# Patient Record
Sex: Female | Born: 1945
Health system: Southern US, Community
[De-identification: ages and names within clinical notes are randomized; demographics above are authoritative.]

## PROBLEM LIST (undated history)

## (undated) DIAGNOSIS — E782 Mixed hyperlipidemia: Secondary | ICD-10-CM

## (undated) DIAGNOSIS — I2510 Atherosclerotic heart disease of native coronary artery without angina pectoris: Principal | ICD-10-CM

## (undated) DIAGNOSIS — I10 Essential (primary) hypertension: Secondary | ICD-10-CM

## (undated) DIAGNOSIS — M1990 Unspecified osteoarthritis, unspecified site: Secondary | ICD-10-CM

## (undated) DIAGNOSIS — S82891A Other fracture of right lower leg, initial encounter for closed fracture: Secondary | ICD-10-CM

## (undated) HISTORY — DX: Mixed hyperlipidemia: E78.2

## (undated) HISTORY — DX: Essential (primary) hypertension: I10

## (undated) HISTORY — DX: Unspecified osteoarthritis, unspecified site: M19.90

## (undated) HISTORY — DX: Other fracture of right lower leg, initial encounter for closed fracture: S82.891A

## (undated) HISTORY — DX: Atherosclerotic heart disease of native coronary artery without angina pectoris: I25.10

---

## 1976-08-26 HISTORY — PX: CERVICAL CONE BIOPSY: SUR198

## 1978-08-26 HISTORY — PX: TUBAL LIGATION: SHX77

## 1998-06-26 ENCOUNTER — Ambulatory Visit (HOSPITAL_COMMUNITY): Admission: RE | Admit: 1998-06-26 | Discharge: 1998-06-26 | Payer: Self-pay | Admitting: Obstetrics and Gynecology

## 1998-06-26 ENCOUNTER — Encounter: Payer: Self-pay | Admitting: Obstetrics and Gynecology

## 1999-01-25 ENCOUNTER — Other Ambulatory Visit: Admission: RE | Admit: 1999-01-25 | Discharge: 1999-01-25 | Payer: Self-pay | Admitting: *Deleted

## 2000-02-26 ENCOUNTER — Other Ambulatory Visit: Admission: RE | Admit: 2000-02-26 | Discharge: 2000-02-26 | Payer: Self-pay | Admitting: Obstetrics and Gynecology

## 2001-02-27 ENCOUNTER — Other Ambulatory Visit: Admission: RE | Admit: 2001-02-27 | Discharge: 2001-02-27 | Payer: Self-pay | Admitting: Obstetrics and Gynecology

## 2002-03-01 ENCOUNTER — Other Ambulatory Visit: Admission: RE | Admit: 2002-03-01 | Discharge: 2002-03-01 | Payer: Self-pay | Admitting: Obstetrics and Gynecology

## 2003-01-31 ENCOUNTER — Ambulatory Visit (HOSPITAL_COMMUNITY): Admission: RE | Admit: 2003-01-31 | Discharge: 2003-01-31 | Payer: Self-pay | Admitting: *Deleted

## 2003-03-04 ENCOUNTER — Other Ambulatory Visit: Admission: RE | Admit: 2003-03-04 | Discharge: 2003-03-04 | Payer: Self-pay | Admitting: Obstetrics and Gynecology

## 2003-08-27 DIAGNOSIS — I2510 Atherosclerotic heart disease of native coronary artery without angina pectoris: Secondary | ICD-10-CM

## 2003-08-27 HISTORY — DX: Atherosclerotic heart disease of native coronary artery without angina pectoris: I25.10

## 2003-11-25 HISTORY — PX: CORONARY STENT PLACEMENT: SHX1402

## 2003-12-20 ENCOUNTER — Ambulatory Visit (HOSPITAL_COMMUNITY): Admission: RE | Admit: 2003-12-20 | Discharge: 2003-12-21 | Payer: Self-pay | Admitting: Cardiology

## 2004-03-06 ENCOUNTER — Other Ambulatory Visit: Admission: RE | Admit: 2004-03-06 | Discharge: 2004-03-06 | Payer: Self-pay | Admitting: Obstetrics and Gynecology

## 2005-04-05 ENCOUNTER — Other Ambulatory Visit: Admission: RE | Admit: 2005-04-05 | Discharge: 2005-04-05 | Payer: Self-pay | Admitting: Obstetrics and Gynecology

## 2006-04-15 ENCOUNTER — Other Ambulatory Visit: Admission: RE | Admit: 2006-04-15 | Discharge: 2006-04-15 | Payer: Self-pay | Admitting: Obstetrics & Gynecology

## 2007-04-21 ENCOUNTER — Other Ambulatory Visit: Admission: RE | Admit: 2007-04-21 | Discharge: 2007-04-21 | Payer: Self-pay | Admitting: Obstetrics & Gynecology

## 2008-05-09 ENCOUNTER — Other Ambulatory Visit: Admission: RE | Admit: 2008-05-09 | Discharge: 2008-05-09 | Payer: Self-pay | Admitting: Obstetrics and Gynecology

## 2010-04-24 ENCOUNTER — Ambulatory Visit: Payer: Self-pay | Admitting: Cardiovascular Disease

## 2010-04-24 DIAGNOSIS — I2510 Atherosclerotic heart disease of native coronary artery without angina pectoris: Secondary | ICD-10-CM | POA: Insufficient documentation

## 2010-04-24 DIAGNOSIS — I10 Essential (primary) hypertension: Secondary | ICD-10-CM | POA: Insufficient documentation

## 2010-04-24 DIAGNOSIS — E785 Hyperlipidemia, unspecified: Secondary | ICD-10-CM | POA: Insufficient documentation

## 2010-09-25 NOTE — Assessment & Plan Note (Signed)
Summary: np6   Visit Type:  Initial Consult Primary Provider:  Dr Ricki Miller  CC:  CAD.  History of Present Illness: 65 year-old woman presents for initial evaluation of CAD. She was initially diagnosed with CAD after an abnormal stress EKG led to cardiac cath in 2005. She was found to have LAD stenosis and was treated with a DES. She had a relook cath after another abnormal stress test and this demonstrated a widely patent stent and no other obstructive CAD (per pt - I do not have report of relook cath).  She denies any history of exertional chest pain or tightness. No dyspnea with exertion. No lightheadedness, palpitations, syncope, or other complaints at this time. She has lost 10 pounds in the past year through exercise. She does regular water aerobics, yoga, and dancing.  Current Medications (verified): 1)  Diovan 80 Mg Tabs (Valsartan) .... Take One Tablet By Mouth Daily 2)  Crestor 10 Mg Tabs (Rosuvastatin Calcium) .... Take One Tablet By Mouth Daily. 3)  Aspirin 81 Mg Tbec (Aspirin) .... Take One Tablet By Mouth Daily 4)  Premarin 0.3 Mg Tabs (Estrogens Conjugated) .... Take 1 Tablet By Mouth Once A Day 5)  Prometrium 100 Mg Caps (Progesterone Micronized) .... Take One Capsule By Mouth Every Other Day 6)  Multivitamins  Tabs (Multiple Vitamin) .... Take 1 Tablet By Mouth Once A Day 7)  Fish Oil 1000 Mg Caps (Omega-3 Fatty Acids) .... Take 1 Capsule By Mouth Once A Day 8)  Calcium 600+d Plus Minerals 600-400 Mg-Unit Tabs (Calcium Carbonate-Vit D-Min) .... Take 1 Tablet By Mouth Once A Day 9)  Vitamin D3 1000 Unit Tabs (Cholecalciferol) .... Take 1 Tablet By Mouth Once A Day  Allergies (verified): No Known Drug Allergies  Past History:  Past medical, surgical, family and social histories (including risk factors) reviewed, and no changes noted (except as noted below).  Past Medical History:  Single-vessel coronary artery disease with 80% mid left anterior descending lesion - treated  with Taxus DES in 2005.  hyperlipidemia  Osteoarthritis  Past Surgical History:  stenting with drug-eluting stent in the mid left    anterior descending lesion   Tubal ligation  Family History: Reviewed history from 04/19/2010 and no changes required. Father had an  MI at age 27 and died at 63 from his 3rd MI Mother Lymphoma age 48 Brother - AAA and has had ASD repair  Social History: Reviewed history from 04/19/2010 and no changes required. No tobacco use 1 glass of wine a day Works as a Production manager from the [employer]  Review of Systems       Positive for episodes of vasovagal syncope in 2008 and 2009, mild bilateral knee pain. Otherwise negative except as per HPI  Vital Signs:  Patient profile:   65 year old female Height:      63 inches Weight:      151.50 pounds BMI:     26.93 Pulse rate:   61 / minute Pulse rhythm:   regular Resp:     18 per minute BP sitting:   114 / 70  (left arm) Cuff size:   large  Vitals Entered By: Vikki Ports (April 24, 2010 9:44 AM)  Physical Exam  General:  Pt is well-developed, alert and oriented, no acute distress HEENT: normal Neck: no thyromegaly           JVP normal, carotid upstrokes normal without bruits Lungs: CTA Chest: equal expansion  CV: Apical impulse nondisplaced, RRR without  murmur or gallop Abd: soft, NT, positive BS, no HSM, no bruit Back: no CVA tenderness Ext: no clubbing, cyanosis, or edema        femoral pulses 2+ without bruits        pedal pulses 2+ and equal Skin: warm, dry, no rash Neuro: CNII-XII intact,strength 5/5 = b/l    EKG  Procedure date:  04/24/2010  Findings:      NSR 61 bpm, within normal limits.  Impression & Recommendations:  Problem # 1:  CORONARY ATHEROSCLEROSIS NATIVE CORONARY ARTERY (ICD-414.01) Pt is stable without angina. She is on appropriate medical therapy for CAD with aspirin and a statin. Since she was asymptomatic at presentation, recommend an exercise echocardiogram when  she returns for followup evaluation in one year. She will likely have a false positive stress EKG based on her past history, so will perform an imaging study with echo.  Her updated medication list for this problem includes:    Aspirin 81 Mg Tbec (Aspirin) .Marland Kitchen... Take one tablet by mouth daily  Orders: EKG w/ Interpretation (93000)  Problem # 2:  ESSENTIAL HYPERTENSION, BENIGN (ICD-401.1) BP well-controlled.  Her updated medication list for this problem includes:    Diovan 80 Mg Tabs (Valsartan) .Marland Kitchen... Take one tablet by mouth daily    Aspirin 81 Mg Tbec (Aspirin) .Marland Kitchen... Take one tablet by mouth daily  Orders: EKG w/ Interpretation (93000)  BP today: 114/70  Problem # 3:  HYPERLIPIDEMIA-MIXED (ICD-272.4) Lipids followed by Dr Ricki Miller. LDL goal less than 100 mg/dL.  Her updated medication list for this problem includes:    Crestor 10 Mg Tabs (Rosuvastatin calcium) .Marland Kitchen... Take one tablet by mouth daily.  Patient Instructions: 1)  Your physician recommends that you continue on your current medications as directed. Please refer to the Current Medication list given to you today. 2)  Your physician wants you to follow-up in:  1 YEAR.  You will receive a reminder letter in the mail two months in advance. If you don't receive a letter, please call our office to schedule the follow-up appointment. 3)  Your physician has requested that you have a stress echocardiogram in 1 YEAR. For further information please visit [email or web address]  Please follow instruction sheet as given.

## 2011-01-11 NOTE — Op Note (Signed)
   NAME:  Kara Wheeler, Kara Wheeler                     ACCOUNT NO.:  [id number]   MEDICAL RECORD NO.:  [id number]                   PATIENT TYPE:  AMB   LOCATION:  ENDO                                 FACILITY:  [facility]   PHYSICIAN:  Georgiana Spinner, M.D.                 DATE OF BIRTH:  08-12-1946   DATE OF PROCEDURE:  DATE OF DISCHARGE:                                 OPERATIVE REPORT   PROCEDURE:  Colonoscopy.   INDICATIONS FOR PROCEDURE:  Hemoccult positivity, colon cancer screening.   ANESTHESIA:  Demerol 60, Versed 8 mg.   DESCRIPTION OF PROCEDURE:  With the patient mildly sedated in the left  lateral decubitus position, a rectal examination was performed which  revealed trace positive material. Subsequently the Olympus videoscopic  colonoscope was inserted in the rectum and passed under direct vision to the  cecum identified by the ileocecal valve and appendiceal orifice both of  which were photographed. From this point, the colonoscope was slowly  withdrawn taking circumferential views of the entire colonic mucosa stopping  only in the rectum which appeared normal on direct and possibly showed an  internal hemorrhoid on retroflexed view. The endoscope was straightened and  withdrawn. The patient's vital signs and pulse oximeter remained stable. The  patient tolerated the procedure well without apparent complications.   FINDINGS:  Unremarkable examination other than possibly a small internal  hemorrhoid.   PLAN:  Have the patient followup with me in as an outpatient.                                               Georgiana Spinner, M.D.    GMO/MEDQ  D:  01/31/2003  T:  01/31/2003  Job:  [id number]

## 2011-01-11 NOTE — Cardiovascular Report (Signed)
NAME:  Kara Wheeler, Kara Wheeler                     ACCOUNT NO.:  [id number]   MEDICAL RECORD NO.:  [id number]                   PATIENT TYPE:  OIB   LOCATION:  6529                                 FACILITY:  [facility]   PHYSICIAN:  John R. Tysinger, M.D.              DATE OF BIRTH:  November 19, 1945   DATE OF PROCEDURE:  12/20/2003  DATE OF DISCHARGE:                              CARDIAC CATHETERIZATION   PROCEDURES:  1. Left heart catheterization.  2. Coronary cineangiography.  3. Left internal mammary artery cineangiography.  4. Intracoronary nitroglycerin injection in right coronary artery.  5. Left ventricular cineangiography.  6. Abdominal aortogram.  7. Angioplasty with primary drug-eluting stent placement in the mid left     anterior descending.  8. Angio-Seal of the right femoral artery.   INDICATION FOR PROCEDURES:  This 65 year old female had the onset of left  anterior chest discomfort on exertion approximately three months ago.  She  then had workup in Dr. Lynne Logan office which included a treadmill exercise  tolerance test which was markedly positive with greater than 2 mm ST  depression in her anterior leads.  She was then scheduled for cardiac  catheterization.  She also has a family history of abdominal aortic  aneurysm.   PROCEDURE:  After signing an informed consent, the patient was premedicated  with 5 mg of Valium by mouth and brought to the cardiac catheterization lab.  Her right groin was prepped and draped in sterile fashion and anesthetized  locally with 1% lidocaine.  A 6 French introducer sheath was inserted  percutaneously into the right femoral artery.  6 [place] #4 Judkins coronary  catheters were used to make injections into the native coronary arteries.  The right coronary catheter was used to make a midstream injection into the  left subclavian artery visualizing the left internal mammary artery.  A 6  French pigtail catheter was used to measures pressure in the  left ventricle  and aorta and to make midstream injections into the left ventricle and  abdominal aorta.  The right coronary catheter was reinserted because of  probable spasm in the ostium of the right coronary artery and intracoronary  nitroglycerin was given into the right coronary artery.  Further cines were  taken showing marked relief of the spasm.  We then reviewed her cines with  Dr. Nanetta Batty who recommended angioplasty with drug-eluting stent in  the mid LAD lesion.  We then selected a 6 [place] JL-4 guide catheter which  was advanced to the root of aorta.  After inserting a short HTF guide wire  in the guide catheter, the tip was engaged in the left coronary artery and  the guide wire was easily passed into the LAD.  After crossing the lesion  with the guide wire, we then selected a 3.0 x 12-mm Taxus stent deployment  system which was advanced over the guide wire and positioned within the  lesion.  The stent  was then deployed with one inflation at 8 atmospheres for  20 seconds.  After the stent was deployed, the balloon was removed and  injection again in the left coronary artery showed an excellent angiographic  result with 0% residual lesion and no evidence for dissection or clot.  There was normal antegrade flow.  The patient tolerated the procedure well  and no complications were noted. At the end of the procedure, the catheter  and sheath were removed from the right femoral artery and hemostasis was  easily obtained in the right femoral artery with an Angio-Seal closure  system.   MEDICATIONS GIVEN:  Heparin 5000 units IV, nitroglycerin 200 units  intracoronary right coronary artery.   CINE FINDINGS:   CORONARY CINEANGIOGRAPHY:  1. Left coronary artery:  The ostium and left main appear normal.  2. Left anterior descending:  The LAD is normal in its proximal segment.     The middle segment has a focal eccentric 80% stenosis that is narrow with     extending plaque  approximately 10 mm in length.  The distal LAD appears     normal.  3. Circumflex coronary artery appears normal.  4. Right coronary artery:  The initial injections in the right coronary     artery showed an ostial stenosis of approximately 50%.  There were mild     irregularities in the distal segment at the acute angle and before the     posterior descending.  There was normal antegrade flow.  Following     intracoronary nitroglycerin, there was marked relief of the narrowing in     the ostium of the right coronary artery with a residual 10-20% stenosis     felt to either be due to residual spasm or a minor lesion.  5. Left internal mammary artery appears normal.   LEFT VENTRICULAR CINEANGIOGRAM:  The left ventricular chamber size and  contractility appear normal.  The ejection fraction was measured at 65%.  There were no abnormally moving segments.  The mitral and aortic valves  appear normal.   ABDOMINAL AORTOGRAM:  The abdominal aorta and iliac arteries appear normal.  Both renal arteries also appear normal.   ANGIOPLASTY CINES:  Cines taken during the angioplasty procedure showed  proper positioning of the guide wire and stent deployment system and a good  balloon form was obtained during the stent deployment.  Followup cines  showed an excellent angiographic result with 0% residual lesion and no  evidence for dissection or clot.  There was normal TIMI-3 antegrade flow.   FINAL DIAGNOSES:  1. Single-vessel coronary artery disease with 80% mid left anterior     descending lesion.  2. Minor plaque in the right coronary artery with induced spasm of the     ostium relieved by intracoronary nitroglycerin.  3. Normal left ventricular function.  Ejection fraction 65%.  4. Normal abdominal aorta, iliac arteries and renal arteries.  5. Successful primary stenting with drug-eluting stent in the mid left     anterior descending lesion.  6. Successful Angio-Seal of the right femoral  artery.   DISPOSITION:  Will monitor on the EAD overnight and anticipate discharge  home in the a.m.  Will start  Plavix and aspirin.  John R. Aleen Campi, M.D.    JRT/MEDQ  D:  12/20/2003  T:  12/20/2003  Job:  [id number]

## 2011-04-09 ENCOUNTER — Encounter: Payer: Self-pay | Admitting: Cardiovascular Disease

## 2011-07-15 ENCOUNTER — Encounter: Payer: Self-pay | Admitting: Cardiovascular Disease

## 2011-07-15 ENCOUNTER — Ambulatory Visit (INDEPENDENT_AMBULATORY_CARE_PROVIDER_SITE_OTHER): Payer: PRIVATE HEALTH INSURANCE | Admitting: Cardiovascular Disease

## 2011-07-15 VITALS — BP 114/66 | HR 70 | Ht 64.0 in | Wt 160.0 lb

## 2011-07-15 DIAGNOSIS — I10 Essential (primary) hypertension: Secondary | ICD-10-CM

## 2011-07-15 DIAGNOSIS — E785 Hyperlipidemia, unspecified: Secondary | ICD-10-CM

## 2011-07-15 DIAGNOSIS — I2510 Atherosclerotic heart disease of native coronary artery without angina pectoris: Secondary | ICD-10-CM

## 2011-07-15 NOTE — Progress Notes (Signed)
HPI:  This is a 65 year old woman presented for followup evaluation. The patient has coronary artery disease and has undergone stenting of the LAD back in 2005 utilizing a drug-eluting stent platform. Her coronary disease was picked up on stress testing and she was asymptomatic at presentation. The patient feels well. She denies chest pain, dyspnea, edema, or palpitations. She reports no recent changes in her medical therapy.  Her exercise has been sporadic over the past year and she notes that she has gained some weight.  Outpatient Encounter Prescriptions as of 07/15/2011  Medication Sig Dispense Refill  . aspirin 81 MG tablet Take 81 mg by mouth daily.        . Calcium Carbonate-Vitamin D (CALCIUM 600 + D PO) Take 1 tablet by mouth daily.        . Cholecalciferol (VITAMIN D-3 PO) Take 1 tablet by mouth daily.        Marland Kitchen estrogens, conjugated, (PREMARIN) 0.3 MG tablet Take 0.3 mg by mouth daily.       . fish oil-omega-3 fatty acids 1000 MG capsule Take 2 g by mouth daily.        . multivitamin (THERAGRAN) per tablet Take 1 tablet by mouth daily.        . progesterone (PROMETRIUM) 100 MG capsule Take 100 mg by mouth every other day.       . rosuvastatin (CRESTOR) 10 MG tablet Take 10 mg by mouth daily.        . valsartan (DIOVAN) 80 MG tablet Take 80 mg by mouth daily.          No Known Allergies  Past Medical History  Diagnosis Date  . Coronary atherosclerosis of native coronary artery 2005    Taxus drug-eluting stent to treat severe stenosis in the proximal LAD  . Mixed dyslipidemia   . Osteoarthritis     ROS: Negative except as per HPI  BP 114/66  Pulse 70  Ht 5\' 4"  (1.626 m)  Wt 72.576 kg (160 lb)  BMI 27.46 kg/m2  PHYSICAL EXAM: Pt is alert and oriented, NAD HEENT: normal Neck: JVP - normal, carotids 2+= without bruits Lungs: CTA bilaterally CV: RRR without murmur or gallop Abd: soft, NT, Positive BS, no hepatomegaly Ext: no C/C/E, distal pulses intact and equal Skin:  warm/dry no rash  EKG:  Normal sinus rhythm 70 beats per minute, within normal limits.  ASSESSMENT AND PLAN:

## 2011-07-15 NOTE — Patient Instructions (Signed)
Your physician wants you to follow-up in: 1 YEAR. You will receive a reminder letter in the mail two months in advance. If you don't receive a letter, please call our office to schedule the follow-up appointment.  Your physician has requested that you have a stress echocardiogram. For further information please visit [email or web address] Please follow instruction sheet as given.  Your physician recommends that you continue on your current medications as directed. Please refer to the Current Medication list given to you today.

## 2011-07-30 ENCOUNTER — Encounter: Payer: Self-pay | Admitting: Cardiovascular Disease

## 2011-07-30 ENCOUNTER — Ambulatory Visit (HOSPITAL_BASED_OUTPATIENT_CLINIC_OR_DEPARTMENT_OTHER): Payer: PRIVATE HEALTH INSURANCE | Admitting: Radiology

## 2011-07-30 ENCOUNTER — Ambulatory Visit (HOSPITAL_COMMUNITY): Payer: PRIVATE HEALTH INSURANCE | Attending: Cardiology | Admitting: Radiology

## 2011-07-30 DIAGNOSIS — E785 Hyperlipidemia, unspecified: Secondary | ICD-10-CM | POA: Insufficient documentation

## 2011-07-30 DIAGNOSIS — I10 Essential (primary) hypertension: Secondary | ICD-10-CM | POA: Insufficient documentation

## 2011-07-30 DIAGNOSIS — I2510 Atherosclerotic heart disease of native coronary artery without angina pectoris: Secondary | ICD-10-CM

## 2011-07-30 DIAGNOSIS — R0989 Other specified symptoms and signs involving the circulatory and respiratory systems: Secondary | ICD-10-CM

## 2011-07-30 NOTE — Assessment & Plan Note (Signed)
The patient is on Crestor. She is followed by Dr. Ricki Miller.

## 2011-07-30 NOTE — Assessment & Plan Note (Signed)
The patient appears stable without angina. However, she was asymptomatic at initial presentation. I have recommended an exercise echocardiogram for CAD surveillance. She has a history of a false positive stress-ECG showed imaging is warranted.  Otherwise she will continue on her current medical regimen as above.

## 2011-07-30 NOTE — Assessment & Plan Note (Signed)
Well-controlled on valsartan. 

## 2011-08-08 ENCOUNTER — Other Ambulatory Visit: Payer: Self-pay | Admitting: *Deleted

## 2011-08-08 DIAGNOSIS — I2510 Atherosclerotic heart disease of native coronary artery without angina pectoris: Secondary | ICD-10-CM

## 2011-08-27 DIAGNOSIS — S82891A Other fracture of right lower leg, initial encounter for closed fracture: Secondary | ICD-10-CM

## 2011-08-27 HISTORY — DX: Other fracture of right lower leg, initial encounter for closed fracture: S82.891A

## 2011-09-09 ENCOUNTER — Ambulatory Visit (HOSPITAL_COMMUNITY): Payer: No Typology Code available for payment source | Attending: Internal Medicine | Admitting: Radiology

## 2011-09-09 VITALS — BP 128/75 | Ht 64.0 in | Wt 158.0 lb

## 2011-09-09 DIAGNOSIS — R5383 Other fatigue: Secondary | ICD-10-CM | POA: Insufficient documentation

## 2011-09-09 DIAGNOSIS — R55 Syncope and collapse: Secondary | ICD-10-CM | POA: Insufficient documentation

## 2011-09-09 DIAGNOSIS — E785 Hyperlipidemia, unspecified: Secondary | ICD-10-CM

## 2011-09-09 DIAGNOSIS — Z8249 Family history of ischemic heart disease and other diseases of the circulatory system: Secondary | ICD-10-CM | POA: Insufficient documentation

## 2011-09-09 DIAGNOSIS — I10 Essential (primary) hypertension: Secondary | ICD-10-CM

## 2011-09-09 DIAGNOSIS — R9439 Abnormal result of other cardiovascular function study: Secondary | ICD-10-CM | POA: Insufficient documentation

## 2011-09-09 DIAGNOSIS — Z9861 Coronary angioplasty status: Secondary | ICD-10-CM | POA: Insufficient documentation

## 2011-09-09 DIAGNOSIS — I2510 Atherosclerotic heart disease of native coronary artery without angina pectoris: Secondary | ICD-10-CM

## 2011-09-09 DIAGNOSIS — R5381 Other malaise: Secondary | ICD-10-CM | POA: Insufficient documentation

## 2011-09-09 DIAGNOSIS — R9431 Abnormal electrocardiogram [ECG] [EKG]: Secondary | ICD-10-CM

## 2011-09-09 MED ORDER — TECHNETIUM TC 99M TETROFOSMIN IV KIT
10.0000 | PACK | Freq: Once | INTRAVENOUS | Status: AC | PRN
  Administered 2011-09-09: 10 via INTRAVENOUS

## 2011-09-09 MED ORDER — TECHNETIUM TC 99M TETROFOSMIN IV KIT
30.0000 | PACK | Freq: Once | INTRAVENOUS | Status: AC | PRN
  Administered 2011-09-09: 30 via INTRAVENOUS

## 2011-09-09 NOTE — Progress Notes (Signed)
[facility] SITE 3 NUCLEAR MED [address] [place] [address] [phone]  Cardiology Nuclear Med Study  Kara Wheeler is a 66 y.o. female [id number] [birth year]   Nuclear Med Background Indication for Stress Test:  Evaluation for Ischemia, Stent Patency, PTCA Patency and Abnormal Stress Echo History:  Angioplasty, Echo, GXT, Heart Catheterization and Stents Cardiac Risk Factors: Family History - CAD and Lipids  Symptoms:  Fatigue and Syncope   Nuclear Pre-Procedure Caffeine/Decaff Intake:  None NPO After: 8:00pm   Lungs:  clear IV 0.9% NS with Angio Cath:  20g  IV Site: R Forearm  IV Started by:  Stanton Kidney, EMT-P  Chest Size (in):  38 Cup Size: C  Height: 5\' 4"  (1.626 m)  Weight:  284 lb (71.668 kg)  BMI:  Body mass index is 27.12 kg/(m^2). Tech Comments:  NA    Nuclear Med Study 1 or 2 day study: 1 day  Stress Test Type:  Stress  Reading MD: Dietrich Pates, MD  Order Authorizing Provider:  M.Cooper  Resting Radionuclide: Technetium 75m Tetrofosmin  Resting Radionuclide Dose: 10.7 mCi   Stress Radionuclide:  Technetium 85m Tetrofosmin  Stress Radionuclide Dose: 33.0 mCi           Stress Protocol Rest HR: 66 Stress HR: 169  Rest BP: 128/75 Stress BP: 182/82  Exercise Time (min): 8:36 METS: 10.10   Predicted Max HR: 156 bpm % Max HR: 108.33 bpm Rate Pressure Product: 13244   Dose of Adenosine (mg):  n/a Dose of Lexiscan: n/a mg  Dose of Atropine (mg): n/a Dose of Dobutamine: n/a mcg/kg/min (at max HR)  Stress Test Technologist: Milana Na, EMT-P  Nuclear Technologist:  Domenic Polite, CNMT     Rest Procedure:  Myocardial perfusion imaging was performed at rest 45 minutes following the intravenous administration of Technetium 55m Tetrofosmin. Rest ECG: NSR  Stress Procedure:  The patient exercised for 8:36.  The patient stopped due to fatigue and denied any chest pain.  There were + specific ST-T wave changes.  Technetium 78m  Tetrofosmin was injected at peak exercise and myocardial perfusion imaging was performed after a brief delay. Stress ECG: 1 mm ST depression in stage III in leads V5, v6.  Normalized by 14 seconds recovery  QPS Raw Data Images:  Soft tissue (diaphragm, breast) surround heart. Stress Images:  Normal homogeneous uptake in all areas of the myocardium. Rest Images:  Normal homogeneous uptake in all areas of the myocardium. Subtraction (SDS):  No evidence of ischemia. Transient Ischemic Dilatation (Normal <1.22):  0.97 Lung/Heart Ratio (Normal <0.45):  0.32  Quantitative Gated Spect Images QGS EDV:  51 ml QGS ESV:  11 ml QGS cine images:  NL LV Function; NL Wall Motion QGS EF: 78%  Impression Exercise Capacity:  Good exercise capacity. BP Response:  Normal blood pressure response. Clinical Symptoms:  No chest pain. ECG Impression:  Electrically positive though specificity limited. Comparison with Prior Nuclear Study: No prior perfusion study.  Overall Impression:  Clinically negative  Electrically positive for ischemia (though specificity limited)  Myoview with normal perfusion  LVEF greater than 70%. Dietrich Pates

## 2012-06-02 LAB — HM PAP SMEAR: HM Pap smear: NEGATIVE

## 2012-07-14 LAB — HM MAMMOGRAPHY: HM Mammogram: NEGATIVE

## 2012-08-11 ENCOUNTER — Ambulatory Visit (INDEPENDENT_AMBULATORY_CARE_PROVIDER_SITE_OTHER): Payer: PRIVATE HEALTH INSURANCE | Admitting: Cardiovascular Disease

## 2012-08-11 ENCOUNTER — Encounter: Payer: Self-pay | Admitting: Cardiovascular Disease

## 2012-08-11 VITALS — BP 112/66 | HR 75 | Ht 63.0 in | Wt 160.0 lb

## 2012-08-11 DIAGNOSIS — I2510 Atherosclerotic heart disease of native coronary artery without angina pectoris: Secondary | ICD-10-CM

## 2012-08-11 NOTE — Patient Instructions (Addendum)
Your physician wants you to follow-up in: June 2015 with Dr Excell Seltzer. You will receive a reminder letter in the mail two months in advance. If you don't receive a letter, please call our office to schedule the follow-up appointment.

## 2012-08-11 NOTE — Progress Notes (Signed)
   HPI:  66 year old woman presenting for followup of coronary artery disease. She underwent stenting of the LAD in 2005. A drug-eluting stent was utilized. She was asymptomatic at presentation but she had an abnormal stress test. This led to cardiac catheterization and subsequent stenting. She continues to do well from a clinical perspective. She denies chest pain, chest pressure, dyspnea, edema, or palpitations. She had a right ankle injury several months ago and has been recovering from this for some time. She's not been engaged in as much exercise because of her ankle problem. A stress nuclear scan in January showed no ischemia. Her left ventricular ejection fraction was greater than 70%.  Outpatient Encounter Prescriptions as of 08/11/2012  Medication Sig Dispense Refill  . aspirin 81 MG tablet Take 81 mg by mouth daily.        . Calcium Carbonate-Vitamin D (CALCIUM 600 + D PO) Take 1 tablet by mouth daily.        . Cholecalciferol (VITAMIN D-3 PO) Take 1 tablet by mouth daily.        Marland Kitchen estrogens, conjugated, (PREMARIN) 0.3 MG tablet Take 0.3 mg by mouth daily.       . fish oil-omega-3 fatty acids 1000 MG capsule Take 2 g by mouth daily.        . multivitamin (THERAGRAN) per tablet Take 1 tablet by mouth daily.        . progesterone (PROMETRIUM) 100 MG capsule Take 100 mg by mouth every other day.       . rosuvastatin (CRESTOR) 10 MG tablet Take 10 mg by mouth daily.        . valsartan (DIOVAN) 80 MG tablet Take 80 mg by mouth daily.          No Known Allergies  Past Medical History  Diagnosis Date  . Coronary atherosclerosis of native coronary artery 2005    Taxus drug-eluting stent to treat severe stenosis in the proximal LAD  . Mixed dyslipidemia   . Osteoarthritis     ROS: Negative except as per HPI  BP 112/66  Pulse 75  Ht 5\' 3"  (1.6 m)  Wt 72.576 kg (160 lb)  BMI 28.34 kg/m2  SpO2 98%  PHYSICAL EXAM: Pt is alert and oriented, NAD HEENT: normal Neck: JVP - normal,  carotids 2+= without bruits Lungs: CTA bilaterally CV: RRR without murmur or gallop Abd: soft, NT, Positive BS, no hepatomegaly Ext: no C/C/E, distal pulses intact and equal Skin: warm/dry no rash  EKG:  Normal sinus rhythm with sinus arrhythmia, within normal limits. Heart rate 66 beats per minute.  ASSESSMENT AND PLAN: 1. Coronary artery disease, native vessel. The patient remained stable without anginal symptoms. Her stress nuclear scan from January of this year was reviewed and it showed no ischemia. She will continue on her current medical program.  2. Hyperlipidemia. The patient's lipids are followed by Dr. Ricki Miller. She's on Crestor 10 mg daily.  For followup, I will plan on seeing her back in about 18 months. She sees Dr. Ricki Miller once yearly and we can alternate visits so that she is seeing one of [place] about every 6 months. She will call if she has any problems in the interim. All of her cardiovascular risk factors appear to be very well controlled.  Tonny Bollman 08/11/2012 1:43 PM

## 2013-06-02 ENCOUNTER — Encounter: Payer: Self-pay | Admitting: Nurse Practitioner

## 2013-06-07 ENCOUNTER — Encounter: Payer: Self-pay | Admitting: Nurse Practitioner

## 2013-06-07 ENCOUNTER — Ambulatory Visit (INDEPENDENT_AMBULATORY_CARE_PROVIDER_SITE_OTHER): Payer: PRIVATE HEALTH INSURANCE | Admitting: Nurse Practitioner

## 2013-06-07 VITALS — BP 122/66 | HR 64 | Resp 16 | Ht 63.25 in | Wt 160.0 lb

## 2013-06-07 DIAGNOSIS — Z01419 Encounter for gynecological examination (general) (routine) without abnormal findings: Secondary | ICD-10-CM

## 2013-06-07 NOTE — Patient Instructions (Signed)

## 2013-06-07 NOTE — Progress Notes (Signed)
Patient ID: Kara Wheeler, female   DOB: 1945/12/26, 67 y.o.   MRN: [id number] 67 y.o. [id number] Married Caucasian Fe here for annual exam.  She tapered off her HRT on her own last December.  Other than some rare night sweats has felt well emotionally. No vaginal dryness.  Her husband still works in [place], but this summer she was able to do a lot of her work from there and spend more time with her grandchildren.  No LMP recorded. Patient is postmenopausal.          Sexually active: yes  The current method of family planning is tubal ligation.    Exercising: yes  Gym/ health club routine includes walking on track  and water aerobics. Smoker:  Former, quit in 1968  Health Maintenance: Pap: 06/02/12, WNL, neg HR HPV MMG: 07/14/12, BI-RADS 1: negative Colonoscopy: 2004 BMD: 07/14/12   T Score:  Spine 0.4; left femur neck -0.1; total 0.0; left radius -1.6 TDaP: 04/21/07 Shingles 2011 Labs: PCP   reports that she quit smoking about 46 years ago. Her smoking use included Cigarettes. She started smoking about 50 years ago. She smoked 0.00 packs per day. She does not have any smokeless tobacco history on file. She reports that she drinks about 3.0 ounces of alcohol per week. She reports that she does not use illicit drugs.  Past Medical History  Diagnosis Date  . Coronary atherosclerosis of native coronary artery 2005    Taxus drug-eluting stent to treat severe stenosis in the proximal LAD  . Mixed dyslipidemia   . Osteoarthritis   . HTN (hypertension)     Past Surgical History  Procedure Laterality Date  . Coronary stent placement    . Tubal ligation Bilateral 1980  . Cervical cone biopsy  1978    benign    Current Outpatient Prescriptions  Medication Sig Dispense Refill  . aspirin 81 MG tablet Take 81 mg by mouth daily.        . Calcium Carbonate-Vitamin D (CALCIUM 600 + D PO) Take 1 tablet by mouth daily.        . Cholecalciferol (VITAMIN D-3 PO) Take 1 tablet by mouth daily.         . fish oil-omega-3 fatty acids 1000 MG capsule Take 2 g by mouth daily.        . multivitamin (THERAGRAN) per tablet Take 1 tablet by mouth daily.        . rosuvastatin (CRESTOR) 10 MG tablet Take 10 mg by mouth daily.        . valsartan (DIOVAN) 80 MG tablet Take 80 mg by mouth daily.         No current facility-administered medications for this visit.    Family History  Problem Relation Age of Onset  . Heart attack Father 59    second at age 63  . Cancer Mother     Lymphoma  . Heart attack Paternal Grandmother   . Stroke Paternal Grandmother   . Heart attack Paternal Grandfather   . Stroke Paternal Grandfather     ROS:  Pertinent items are noted in HPI.  Otherwise, a comprehensive ROS was negative.  Exam:   BP 122/66  Pulse 64  Resp 16  Ht 5' 3.25" (1.607 m)  Wt 160 lb (72.576 kg)  BMI 28.1 kg/m2 Height: 5' 3.25" (160.7 cm)  Ht Readings from Last 3 Encounters:  06/07/13 5' 3.25" (1.607 m)  08/11/12 5\' 3"  (1.6 m)  09/09/11 5\' 4"  (  1.626 m)    General appearance: alert, cooperative and appears stated age Head: Normocephalic, without obvious abnormality, atraumatic Neck: no adenopathy, supple, symmetrical, trachea midline and thyroid normal to inspection and palpation Lungs: clear to auscultation bilaterally Breasts: normal appearance, no masses or tenderness Heart: regular rate and rhythm Abdomen: soft, non-tender; no masses,  no organomegaly Extremities: extremities normal, atraumatic, no cyanosis or edema Skin: Skin color, texture, turgor normal. No rashes or lesions Lymph nodes: Cervical, supraclavicular, and axillary nodes normal. No abnormal inguinal nodes palpated Neurologic: Grossly normal   Pelvic: External genitalia:  no lesions              Urethra:  normal appearing urethra with no masses, tenderness or lesions              Bartholin's and Skene's: normal                 Vagina: normal appearing vagina with normal color and discharge, no lesions               Cervix: anteverted              Pap taken: no Bimanual Exam:  Uterus:  normal size, contour, position, consistency, mobility, non-tender              Adnexa: no mass, fullness, tenderness               Rectovaginal: Confirms               Anus:  normal sphincter tone, no lesions  A:  Well Woman with normal exam  Postmenopausal - off HRT 07/2012  P:   Pap smear as per guidelines   Mammogram due 11/14  Counseled on breast self exam, adequate intake of calcium and vitamin D, diet and exercise return annually or prn  An After Visit Summary was printed and given to the patient.

## 2013-06-10 NOTE — Progress Notes (Signed)
Encounter reviewed by Dr. Silena Wyss Silva.  

## 2013-08-05 ENCOUNTER — Encounter: Payer: Self-pay | Admitting: Cardiovascular Disease

## 2013-08-26 LAB — HM DEXA SCAN: HM Dexa Scan: NORMAL

## 2013-09-26 LAB — HM COLONOSCOPY

## 2013-12-13 ENCOUNTER — Encounter: Payer: Self-pay | Admitting: Cardiovascular Disease

## 2014-04-01 ENCOUNTER — Ambulatory Visit: Payer: PRIVATE HEALTH INSURANCE | Admitting: Cardiovascular Disease

## 2014-06-23 ENCOUNTER — Ambulatory Visit: Payer: PRIVATE HEALTH INSURANCE | Admitting: Cardiovascular Disease

## 2014-06-27 ENCOUNTER — Encounter: Payer: Self-pay | Admitting: Nurse Practitioner

## 2014-07-06 ENCOUNTER — Encounter: Payer: Self-pay | Admitting: Cardiovascular Disease

## 2014-08-11 LAB — CBC AND DIFFERENTIAL
HEMATOCRIT: 39 % (ref 36–46)
Hemoglobin: 13.6 g/dL (ref 12.0–16.0)
PLATELETS: 161 10*3/uL (ref 150–399)
WBC: 4.9 10*3/mL

## 2014-08-11 LAB — TSH: TSH: 1.32 u[IU]/mL (ref <=5.90)

## 2014-08-11 LAB — LIPID PANEL
Cholesterol: 171 mg/dL (ref 0–200)
HDL: 72 mg/dL — AB (ref 35–70)
LDL CALC: 90 mg/dL
Triglycerides: 44 mg/dL (ref 40–160)

## 2014-08-30 ENCOUNTER — Encounter: Payer: Self-pay | Admitting: Cardiovascular Disease

## 2014-08-30 DIAGNOSIS — Z0001 Encounter for general adult medical examination with abnormal findings: Secondary | ICD-10-CM | POA: Diagnosis not present

## 2014-08-30 DIAGNOSIS — R740 Nonspecific elevation of levels of transaminase and lactic acid dehydrogenase [LDH]: Secondary | ICD-10-CM | POA: Diagnosis not present

## 2014-08-30 DIAGNOSIS — E780 Pure hypercholesterolemia: Secondary | ICD-10-CM | POA: Diagnosis not present

## 2014-08-30 DIAGNOSIS — I10 Essential (primary) hypertension: Secondary | ICD-10-CM | POA: Diagnosis not present

## 2014-09-26 LAB — HM MAMMOGRAPHY: HM Mammogram: NORMAL

## 2014-10-04 DIAGNOSIS — Z1231 Encounter for screening mammogram for malignant neoplasm of breast: Secondary | ICD-10-CM | POA: Diagnosis not present

## 2014-11-20 NOTE — Progress Notes (Signed)
Cardiology Office Note   Date:  11/22/2014   ID:  Kara Wheeler, DOB 02/09/1946, MRN [id number]  PCP:  Tommy Medal, MD  Cardiologist:  Sherren Mocha, MD    Chief Complaint  Patient presents with  . Coronary Artery Disease     History of Present Illness: Kara Wheeler is a 69 y.o. female who presents for followup of coronary artery disease. She underwent stenting of the LAD in 2005. A drug-eluting stent was utilized. She was asymptomatic at presentation but she had an abnormal stress test. This led to cardiac catheterization and subsequent stenting.  The patient continues to do very well. She was last seen here in 2013. She's been very active with regular exercise and has no exertional symptoms. Specifically denies chest pain, chest pressure, shortness of breath, palpitations, or leg swelling.   Past Medical History  Diagnosis Date  . Coronary atherosclerosis of native coronary artery 2005    Taxus drug-eluting stent to treat severe stenosis in the proximal LAD  . Mixed dyslipidemia   . Osteoarthritis   . HTN (hypertension)     Past Surgical History  Procedure Laterality Date  . Coronary stent placement  11/2003    LAD  . Tubal ligation Bilateral 1980  . Cervical cone biopsy  1978    benign    Current Outpatient Prescriptions  Medication Sig Dispense Refill  . aspirin 81 MG tablet Take 81 mg by mouth daily.      . Calcium Carbonate-Vitamin D (CALCIUM 600 + D PO) Take 1 tablet by mouth daily.      . Cholecalciferol (VITAMIN D-3 PO) Take 500 Units by mouth daily.     . fish oil-omega-3 fatty acids 1000 MG capsule Take 2 g by mouth daily.      . multivitamin (THERAGRAN) per tablet Take 1 tablet by mouth daily.      . rosuvastatin (CRESTOR) 20 MG tablet Take 10 mg by mouth daily. Patient taking one half tablet (10 mg) by mouth daily    . valsartan (DIOVAN) 160 MG tablet Take 80 mg by mouth daily. Patient taking one half tablet by mouth (80 mg) daily     No  current facility-administered medications for this visit.    Allergies:   Review of patient's allergies indicates no known allergies.   Social History:  The patient  reports that she quit smoking about 48 years ago. Her smoking use included Cigarettes. She started smoking about 52 years ago. She has a 1 pack-year smoking history. She has never used smokeless tobacco. She reports that she drinks about 3.0 oz of alcohol per week. She reports that she does not use illicit drugs.   Family History:  The patient's family history includes Cancer in her mother; Heart attack in her paternal grandfather and paternal grandmother; Heart attack (age of onset: 70) in her father; Heart disease in her brother; Stroke in her paternal grandfather and paternal grandmother.    ROS:  Please see the history of present illness.  All other systems are reviewed and negative.    PHYSICAL EXAM: VS:  BP 120/78 mmHg  Pulse 60  Ht 5' 3.25" (1.607 m)  Wt 158 lb (71.668 kg)  BMI 27.75 kg/m2 , BMI Body mass index is 27.75 kg/(m^2). GEN: Well nourished, well developed, in no acute distress HEENT: normal Neck: no JVD, no masses. No carotid bruits Cardiac: RRR without murmur or gallop  Respiratory:  clear to auscultation bilaterally, normal work of breathing GI: soft, nontender, nondistended, + BS MS: no deformity or atrophy Ext: no pretibial edema, pedal pulses 2+= bilaterally Skin: warm and dry, no rash Neuro:  Strength and sensation are intact Psych: euthymic mood, full affect  EKG:  EKG is ordered today. The ekg ordered today shows Normal Sinus Rhythm 60 bpm, within normal limits  Recent Labs: No results found for requested labs within last 365 days.   Lipid Panel  No results found for: CHOL, TRIG, HDL, CHOLHDL, VLDL, LDLCALC, LDLDIRECT    Wt Readings from Last 3 Encounters:  11/22/14 158 lb (71.668 kg)  06/07/13 160 lb (72.576 kg)  08/11/12 160 lb (72.576 kg)     Cardiac Studies  Reviewed: Myoview 09/09/2011: Impression Exercise Capacity: Good exercise capacity. BP Response: Normal blood pressure response. Clinical Symptoms: No chest pain. ECG Impression: Electrically positive though specificity limited. Comparison with Prior Nuclear Study: No prior perfusion study.  Overall Impression: Clinically negative Electrically positive for ischemia (though specificity limited) Myoview with normal perfusion LVEF greater than 70%.  Cardiac catheterization 12/20/2003: FINAL DIAGNOSES: 1. Single-vessel coronary artery disease with 80% mid left anterior  descending lesion. 2. Minor plaque in the right coronary artery with induced spasm of the  ostium relieved by intracoronary nitroglycerin. 3. Normal left ventricular function. Ejection fraction 65%. 4. Normal abdominal aorta, iliac arteries and renal arteries. 5. Successful primary stenting with drug-eluting stent in the mid left  anterior descending lesion. 6. Successful Angio-Seal of the right femoral artery.  DISPOSITION: Will monitor on the EAD overnight and anticipate discharge home in the a.m. Will start Plavix and aspirin.  ASSESSMENT AND PLAN: 1.  CAD, native vessel:  The patient is stable without symptoms of angina. Her medical program was reviewed and will be continued without change. Considering the fact she was completely asymptomatic time of her CAD diagnosis, I have recommended an exercise stress echo for further evaluation.  2. Essential hypertension:  Blood pressure is well controlled on the patient's current medical therapy unless will be continued.  3. Hyperlipidemia: the patient is tolerating statin drug and her lipids are followed by her primary physician. I have personally reviewed her most recent  Labs which included a lipid panel. The patient's total cholesterol is 171, HDL is 72, and LDL 90.   Current medicines are reviewed with the patient today.  The patient does not  have concerns regarding medicines.  The following changes have been made:  no change  Labs/ tests ordered today include:   Orders Placed This Encounter  Procedures  . EKG 12-Lead  . Echocardiogram stress test without contrast    Disposition:   FU one year  Signed, Sherren Mocha, MD  11/22/2014 1:51 PM    [facility] [place], [place], [place]  [address] Phone: [phone]; Fax: [phone]

## 2014-11-22 ENCOUNTER — Encounter: Payer: Self-pay | Admitting: Cardiovascular Disease

## 2014-11-22 ENCOUNTER — Ambulatory Visit (INDEPENDENT_AMBULATORY_CARE_PROVIDER_SITE_OTHER): Payer: Commercial Managed Care - HMO | Admitting: Cardiovascular Disease

## 2014-11-22 VITALS — BP 120/78 | HR 60 | Ht 63.25 in | Wt 158.0 lb

## 2014-11-22 DIAGNOSIS — I2510 Atherosclerotic heart disease of native coronary artery without angina pectoris: Secondary | ICD-10-CM

## 2014-11-22 NOTE — Patient Instructions (Signed)
Your physician recommends that you continue on your current medications as directed. Please refer to the Current Medication list given to you today.  Your physician has requested that you have a stress echocardiogram. For further information please visit [email or web address]. Please follow instruction sheet as given.  Your physician wants you to follow-up in: 1 year with Dr. Burt Knack. You will receive a reminder letter in the mail two months in advance. If you don't receive a letter, please call our office to schedule the follow-up appointment.

## 2014-11-29 ENCOUNTER — Ambulatory Visit (HOSPITAL_COMMUNITY): Payer: Commercial Managed Care - HMO | Attending: Cardiovascular Disease

## 2014-11-29 DIAGNOSIS — R740 Nonspecific elevation of levels of transaminase and lactic acid dehydrogenase [LDH]: Secondary | ICD-10-CM | POA: Diagnosis not present

## 2014-11-29 DIAGNOSIS — I2510 Atherosclerotic heart disease of native coronary artery without angina pectoris: Secondary | ICD-10-CM | POA: Insufficient documentation

## 2014-11-29 LAB — HEPATIC FUNCTION PANEL
ALK PHOS: 85 U/L (ref 25–125)
ALT: 52 U/L — AB (ref 7–35)
AST: 55 U/L — AB (ref 13–35)
Bilirubin, Total: 1.4 mg/dL

## 2014-11-29 LAB — BASIC METABOLIC PANEL
BUN: 17 mg/dL (ref 4–21)
CREATININE: 1 mg/dL (ref 0.5–1.1)
GLUCOSE: 96 mg/dL
Potassium: 4.5 mmol/L (ref 3.4–5.3)
Sodium: 138 mmol/L (ref 137–147)

## 2014-11-29 NOTE — Progress Notes (Signed)
Stress echo performed.

## 2015-01-17 DIAGNOSIS — R197 Diarrhea, unspecified: Secondary | ICD-10-CM | POA: Diagnosis not present

## 2015-03-22 DIAGNOSIS — H524 Presbyopia: Secondary | ICD-10-CM | POA: Diagnosis not present

## 2015-03-22 DIAGNOSIS — H5210 Myopia, unspecified eye: Secondary | ICD-10-CM | POA: Diagnosis not present

## 2015-05-30 ENCOUNTER — Telehealth: Payer: Self-pay | Admitting: Internal Medicine

## 2015-05-30 NOTE — Telephone Encounter (Signed)
Pts MD moving and Dr. Larose Kells agreed to est.  From spouses AVS 05/30/15 Scheduler: Please arrange a office visit for the patient's wife, she's a new patient

## 2015-09-12 ENCOUNTER — Ambulatory Visit: Payer: Commercial Managed Care - HMO | Admitting: Internal Medicine

## 2015-10-03 ENCOUNTER — Encounter: Payer: Self-pay | Admitting: Behavioral Health

## 2015-10-03 ENCOUNTER — Telehealth: Payer: Self-pay | Admitting: Behavioral Health

## 2015-10-03 NOTE — Telephone Encounter (Signed)
Pre-Visit Call completed with patient and chart updated.   Pre-Visit Info documented in Specialty Comments under [employer].    

## 2015-10-04 ENCOUNTER — Encounter: Payer: Self-pay | Admitting: Internal Medicine

## 2015-10-04 ENCOUNTER — Ambulatory Visit (INDEPENDENT_AMBULATORY_CARE_PROVIDER_SITE_OTHER): Payer: Commercial Managed Care - HMO | Admitting: Internal Medicine

## 2015-10-04 VITALS — BP 116/72 | HR 69 | Temp 98.2°F | Ht 63.0 in | Wt 161.0 lb

## 2015-10-04 DIAGNOSIS — Z01419 Encounter for gynecological examination (general) (routine) without abnormal findings: Secondary | ICD-10-CM

## 2015-10-04 DIAGNOSIS — E785 Hyperlipidemia, unspecified: Secondary | ICD-10-CM | POA: Diagnosis not present

## 2015-10-04 DIAGNOSIS — I10 Essential (primary) hypertension: Secondary | ICD-10-CM | POA: Diagnosis not present

## 2015-10-04 DIAGNOSIS — Z7689 Persons encountering health services in other specified circumstances: Secondary | ICD-10-CM

## 2015-10-04 NOTE — Progress Notes (Signed)
Pre visit review using our clinic review tool, if applicable. No additional management support is needed unless otherwise documented below in the visit note. 

## 2015-10-04 NOTE — Patient Instructions (Addendum)
GO TO THE FRONT DESK Schedule labs to be done within few days (fasting)  Schedule a routine office visit or check up to be done in  6 months . No  fasting

## 2015-10-04 NOTE — Progress Notes (Signed)
Subjective:    Patient ID: Kara Wheeler, female    DOB: 1946-02-26, 70 y.o.   MRN: [id number]  DOS:  10/04/2015 Type of visit - description : New patient, previous PCP Dr. Minna Ant[place] Interval history: Today she is feeling well and has no major concerns, records from cardiology and previous PCP reviewed and summarized. HTN: Compliance of medication, BP today is great. CAD: Good compliance with medication, currently asymptomatic Hyperlipidemia: On statins, no apparent side effects   Review of Systems  Denies chest pain or difficulty breathing No nausea, vomiting, diarrhea. No myalgias Past Medical History  Diagnosis Date  . Coronary atherosclerosis of native coronary artery 2005    Taxus drug-eluting stent to treat severe stenosis in the proximal LAD  . Mixed dyslipidemia   . Osteoarthritis   . HTN (hypertension)   . Ankle fracture, right 2013    Past Surgical History  Procedure Laterality Date  . Coronary stent placement  11/2003    LAD  . Tubal ligation Bilateral 1980  . Cervical cone biopsy  1978    benign    Social History   Social History  . Marital Status: Married    Spouse Name: N/A  . Number of Children: 1  . Years of Education: N/A   Occupational History  . 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 Smoking status: Former Smoker -- 0.25 packs/day for 4 years    Types: Cigarettes    Start date: 08/26/1962    Quit date: 08/26/1966  . Smokeless tobacco: Never Used  . Alcohol Use: 3.0 oz/week    5 Glasses of wine per week  . Drug Use: No     Comment: marijuana -- very little yrs ago  . Sexual Activity:    Partners: Male    Patent examiner Protection: Surgical, Post-menopausal     Comment: tubal   Other Topics Concern  . Not on file   Social History Narrative   Household-- pt and husband (Mr [place])     Family History  Problem Relation Age of Onset  . Heart attack Father 95    second at age  30  . Lymphoma Mother     Lymphoma  . Heart attack Paternal Grandmother   . Stroke Paternal Grandmother   . Heart attack Paternal Grandfather   . Stroke Paternal Grandfather   . Heart disease Brother   . Colon cancer Neg Hx   . Breast cancer Neg Hx   . Diabetes Neg Hx        Medication List       This list is accurate as of: 10/04/15  1:56 PM.  Always use your most recent med list.               aspirin 81 MG tablet  Take 81 mg by mouth daily.     CALCIUM 600 + D PO  Take 1 tablet by mouth daily.     fish oil-omega-3 fatty acids 1000 MG capsule  Take 2 g by mouth daily.     multivitamin per tablet  Take 1 tablet by mouth daily.     rosuvastatin 20 MG tablet  Commonly known as:  CRESTOR  Take 10 mg by mouth daily. Patient taking one half tablet (10 mg) by mouth daily     valsartan 160 MG tablet  Commonly known as:  DIOVAN  Take 80 mg by mouth daily. Patient taking  one half tablet by mouth (80 mg) daily     VITAMIN D-3 PO  Take 500 Units by mouth daily.           Objective:   Physical Exam BP 116/72 mmHg  Pulse 69  Temp(Src) 98.2 F (36.8 C) (Oral)  Ht 5\' 3"  (1.6 m)  Wt 161 lb (73.029 kg)  BMI 28.53 kg/m2  SpO2 97% General:   Well developed, well nourished . NAD.  HEENT:  Normocephalic . Face symmetric, atraumatic. Neck: No thyromegaly Lungs:  CTA B Normal respiratory effort, no intercostal retractions, no accessory muscle use. Heart: RRR,  no murmur.  no pretibial edema bilaterally  Abdomen:  Not distended, soft, non-tender. No rebound or rigidity.   Skin: Not pale. Not jaundice Neurologic:  alert & oriented X3.  Speech normal, gait appropriate for age and unassisted Psych--  Cognition and judgment appear intact.  Cooperative with normal attention span and concentration.  Behavior appropriate. No anxious or depressed appearing.    Assessment & Plan:   Assessment HTN Dyslipidemia Elevated LFTs per chart review: AST slightly high,  normal ALT CV: CAD dx 2005, was asx >> stent  H/o false  + negative EKG Myoview 2013 negative.  stress echo normal 11-2014 H/o abnormal PAP, cone bx (remotely) H/o Fx distal fibula -R- 2013, no surgery  PLAN HTN: Continue Diovan, check a CMP and CBC. High cholesterol: On Crestor, check labs CAD: Asymptomatic, stress echo normal last year, follow-up by cardiology. Primary care: Used to see Gyn --will arrange a referral RTC 6 months   Records reviewed: Td  2008: Pneumonia shot 2015; Zostavax 2011 Labs 08/11/2014: Creatinine 1.1, potassium 4.4, AST 51 high, ALT 47. Hemoglobin 13.6 Total cholesterol 153.   LDL 82 Labs 11/29/2014:  Creatinine 1.0. AST 55 slightly high ALT 52 normal. Hemoglobin 13.6. Platelets 161. Mammogram at [facility] 10/04/2014 normal Colonoscopy 10/07/2013 completely normal, 10 years   Today, I spent more than 35   min with the patient: >50% of the time counseling regards the results of her previous blood work, given her own records.

## 2015-10-05 ENCOUNTER — Other Ambulatory Visit (INDEPENDENT_AMBULATORY_CARE_PROVIDER_SITE_OTHER): Payer: Commercial Managed Care - HMO

## 2015-10-05 DIAGNOSIS — I10 Essential (primary) hypertension: Secondary | ICD-10-CM

## 2015-10-05 DIAGNOSIS — E785 Hyperlipidemia, unspecified: Secondary | ICD-10-CM | POA: Diagnosis not present

## 2015-10-05 LAB — CBC WITH DIFFERENTIAL/PLATELET
BASOS PCT: 0.4 % (ref 0.0–3.0)
Basophils Absolute: 0 10*3/uL (ref 0.0–0.1)
EOS PCT: 3.8 % (ref 0.0–5.0)
Eosinophils Absolute: 0.2 10*3/uL (ref 0.0–0.7)
HCT: 41.3 % (ref 36.0–46.0)
Hemoglobin: 13.8 g/dL (ref 12.0–15.0)
LYMPHS ABS: 2.1 10*3/uL (ref 0.7–4.0)
Lymphocytes Relative: 44.6 % (ref 12.0–46.0)
MCHC: 33.5 g/dL (ref 30.0–36.0)
MCV: 90.1 fl (ref 78.0–100.0)
MONOS PCT: 10.6 % (ref 3.0–12.0)
Monocytes Absolute: 0.5 10*3/uL (ref 0.1–1.0)
NEUTROS PCT: 40.6 % — AB (ref 43.0–77.0)
Neutro Abs: 1.9 10*3/uL (ref 1.4–7.7)
Platelets: 165 10*3/uL (ref 150.0–400.0)
RBC: 4.59 Mil/uL (ref 3.87–5.11)
RDW: 12.9 % (ref 11.5–15.5)
WBC: 4.8 10*3/uL (ref 4.0–10.5)

## 2015-10-05 LAB — COMPREHENSIVE METABOLIC PANEL
ALBUMIN: 4.5 g/dL (ref 3.5–5.2)
ALK PHOS: 72 U/L (ref 39–117)
ALT: 29 U/L (ref 0–35)
AST: 38 U/L — ABNORMAL HIGH (ref 0–37)
BILIRUBIN TOTAL: 1.1 mg/dL (ref 0.2–1.2)
BUN: 17 mg/dL (ref 6–23)
CALCIUM: 10.2 mg/dL (ref 8.4–10.5)
CO2: 32 mEq/L (ref 19–32)
Chloride: 103 mEq/L (ref 96–112)
Creatinine, Ser: 0.96 mg/dL (ref 0.40–1.20)
GFR: 61.34 mL/min (ref >60.00)
GLUCOSE: 95 mg/dL (ref 70–99)
POTASSIUM: 4.5 meq/L (ref 3.5–5.1)
Sodium: 139 mEq/L (ref 135–145)
TOTAL PROTEIN: 6.8 g/dL (ref 6.0–8.3)

## 2015-10-05 LAB — LIPID PANEL
CHOLESTEROL: 183 mg/dL (ref 0–200)
HDL: 61.1 mg/dL (ref >39.00)
LDL Cholesterol: 102 mg/dL — ABNORMAL HIGH (ref 0–99)
NONHDL: 122.2
TRIGLYCERIDES: 101 mg/dL (ref 0.0–149.0)
Total CHOL/HDL Ratio: 3
VLDL: 20.2 mg/dL (ref 0.0–40.0)

## 2015-10-25 ENCOUNTER — Ambulatory Visit (INDEPENDENT_AMBULATORY_CARE_PROVIDER_SITE_OTHER): Payer: Commercial Managed Care - HMO | Admitting: Family Medicine

## 2015-10-25 ENCOUNTER — Encounter: Payer: Self-pay | Admitting: Family Medicine

## 2015-10-25 VITALS — BP 114/67 | HR 62 | Ht 64.0 in | Wt 162.0 lb

## 2015-10-25 DIAGNOSIS — Z1231 Encounter for screening mammogram for malignant neoplasm of breast: Secondary | ICD-10-CM | POA: Diagnosis not present

## 2015-10-25 DIAGNOSIS — Z01419 Encounter for gynecological examination (general) (routine) without abnormal findings: Secondary | ICD-10-CM | POA: Diagnosis not present

## 2015-10-25 NOTE — Progress Notes (Signed)
  Subjective:    Kara Wheeler is a 70 y.o. female who presents for an annual exam. The patient has no complaints today. The patient is sexually active. GYN screening history: last mammogram: was normal. The patient wears seatbelts: yes. The patient participates in regular exercise: yes. Has the patient ever been transfused or tattooed?: not asked. The patient reports that there is not domestic violence in her life. She has no other concerns today.  Menstrual History: OB History    Gravida Para Term Preterm AB TAB SAB Ectopic Multiple Living   1 1 1       1        No LMP recorded. Patient is postmenopausal.    The following portions of the patient's history were reviewed and updated as appropriate: allergies, current medications, past family history, past medical history, past social history, past surgical history and problem list.  Review of Systems Pertinent items noted in HPI and remainder of comprehensive ROS otherwise negative.    Objective:    BP 114/67 mmHg  Pulse 62  Ht 5\' 4"  (1.626 m)  Wt 162 lb (73.483 kg)  BMI 27.79 kg/m2  General Appearance:    Alert, cooperative, no distress, appears stated age  Head:    Normocephalic, without obvious abnormality, atraumatic  Eyes:    PERRL, conjunctiva/corneas clear, EOM's intact, fundi    benign, both eyes  Neck:   Supple, symmetrical, trachea midline, no adenopathy;    thyroid:  no enlargement/tenderness/nodules; no carotid   bruit or JVD  Lungs:     Clear to auscultation bilaterally, respirations unlabored  Chest Wall:    No tenderness or deformity   Heart:    Regular rate and rhythm, S1 and S2 normal, no murmur, rub   or gallop  Breast Exam:    No tenderness, masses, or nipple abnormality  Abdomen:     Soft, non-tender, bowel sounds active all four quadrants,    no masses, no organomegaly  Genitalia:    Normal female without lesion, discharge or tenderness.    Atrophic vaginal mucosa.  Multiparus cervix.  Uterus    retroverted with normal ovaries.  No masses palpated.  Extremities:   Extremities normal, atraumatic, no cyanosis or edema  Pulses:   2+ and symmetric all extremities  Skin:   Skin color, texture, turgor normal, no rashes or lesions  Lymph nodes:   Cervical, supraclavicular, and axillary nodes normal  Neurologic:   CNII-XII intact, normal strength, sensation and reflexes    throughout  .    Assessment:    Healthy female exam.    Plan:     All questions answered. Discussed healthy lifestyle modifications. Mammogram. Has had colonoscopy and bone mineral density scan.  Continue Calcium, Vitamin D.  Continue exercise.  Follow up as needed.

## 2015-11-06 ENCOUNTER — Ambulatory Visit (HOSPITAL_BASED_OUTPATIENT_CLINIC_OR_DEPARTMENT_OTHER)
Admission: RE | Admit: 2015-11-06 | Discharge: 2015-11-06 | Disposition: A | Discharge status: Home / Self Care | Payer: Commercial Managed Care - HMO | Source: Ambulatory Visit | Attending: Family Medicine | Admitting: Family Medicine

## 2015-11-06 DIAGNOSIS — Z1231 Encounter for screening mammogram for malignant neoplasm of breast: Secondary | ICD-10-CM | POA: Diagnosis not present

## 2015-12-06 ENCOUNTER — Encounter: Payer: Self-pay | Admitting: Internal Medicine

## 2015-12-07 MED ORDER — ROSUVASTATIN CALCIUM 20 MG PO TABS: 20.0000 mg | ORAL_TABLET | Freq: Every day | ORAL | Status: DC

## 2015-12-07 NOTE — Telephone Encounter (Signed)
Rx sent 

## 2016-01-15 ENCOUNTER — Encounter: Payer: Self-pay | Admitting: Internal Medicine

## 2016-01-15 ENCOUNTER — Ambulatory Visit (INDEPENDENT_AMBULATORY_CARE_PROVIDER_SITE_OTHER): Payer: Commercial Managed Care - HMO | Admitting: Internal Medicine

## 2016-01-15 VITALS — BP 122/72 | HR 77 | Temp 98.1°F | Ht 64.0 in | Wt 163.1 lb

## 2016-01-15 DIAGNOSIS — M79604 Pain in right leg: Secondary | ICD-10-CM

## 2016-01-15 DIAGNOSIS — I2510 Atherosclerotic heart disease of native coronary artery without angina pectoris: Secondary | ICD-10-CM

## 2016-01-15 DIAGNOSIS — M25561 Pain in right knee: Secondary | ICD-10-CM

## 2016-01-15 NOTE — Progress Notes (Signed)
Subjective:    Patient ID: Kara Wheeler, female    DOB: [birth year], 70 y.o.   MRN: [id number]  DOS:  01/15/2016 Type of visit - description : acute visit  Interval history:  Symptoms started 12/25/2015, when she wakes up and start walking had moderate pain that goes from the right buttock down to the posterior R  leg to the knee. After several steps, the pain stops. No problem the rest of the day. Pain  has been consistent since then every morning. Tylenol helped a little.  Review of Systems No injury No lower extremity paresthesias No rash No focal weaknesses or back pain per se  Past Medical History  Diagnosis Date  . Coronary atherosclerosis of native coronary artery 2005    Taxus drug-eluting stent to treat severe stenosis in the proximal LAD  . Mixed dyslipidemia   . Osteoarthritis   . HTN (hypertension)   . Ankle fracture, right 2013    Past Surgical History  Procedure Laterality Date  . Coronary stent placement  11/2003    LAD  . Tubal ligation Bilateral 1980  . Cervical cone biopsy  1978    benign    Social History   Social History  . Marital Status: Married    Spouse Name: N/A  . Number of Children: 1  . Years of Education: N/A   Occupational History  . 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 Smoking status: Former Smoker -- 0.25 packs/day for 4 years    Types: Cigarettes    Start date: 08/26/1962    Quit date: 08/26/1966  . Smokeless tobacco: Never Used  . Alcohol Use: 3.0 oz/week    5 Glasses of wine per week  . Drug Use: No     Comment: marijuana -- very little yrs ago  . Sexual Activity:    Partners: Male    Patent examiner Protection: Surgical, Post-menopausal     Comment: tubal   Other Topics Concern  . Not on file   Social History Narrative   Household-- pt and husband (Mr [place])        Medication List       This list is accurate as of: 01/15/16 11:59 PM.  Always use your most recent med  list.               aspirin 81 MG tablet  Take 81 mg by mouth daily.     CALCIUM 600 + D PO  Take 1 tablet by mouth daily.     fish oil-omega-3 fatty acids 1000 MG capsule  Take 2 g by mouth daily.     multivitamin per tablet  Take 1 tablet by mouth daily.     rosuvastatin 20 MG tablet  Commonly known as:  CRESTOR  Take 1 tablet (20 mg total) by mouth daily.     valsartan 160 MG tablet  Commonly known as:  DIOVAN  Take 80 mg by mouth daily. Patient taking one half tablet by mouth (80 mg) daily     VITAMIN D-3 PO  Take 500 Units by mouth daily.           Objective:   Physical Exam BP 122/72 mmHg  Pulse 77  Temp(Src) 98.1 F (36.7 C) (Oral)  Ht 5\' 4"  (1.626 m)  Wt 163 lb 2 oz (73.993 kg)  BMI 27.99 kg/m2  SpO2 97% General:   Well developed, well nourished . NAD.  HEENT:  Normocephalic . Face symmetric, atraumatic MSK: Hip rotation normal, no TTP at the back and sacroiliac areas  Skin: Not pale. Not jaundice Neurologic:  alert & oriented X3.  Speech normal, gait appropriate for age and unassisted. DTRs symmetric. Straight leg test negative Psych--  Cognition and judgment appear intact.  Cooperative with normal attention span and concentration.  Behavior appropriate. No anxious or depressed appearing.      Assessment & Plan:   Assessment HTN Dyslipidemia Elevated LFTs per chart review: AST slightly high, normal ALT CV: CAD dx 2005, was asx >> stent  H/o false  + negative EKG Myoview 2013 negative.  stress echo normal 11-2014 H/o abnormal PAP, cone bx (remotely) H/o Fx distal fibula -R- 2013, no surgery  PLAN Right leg pain: Given description of sx, doubt radiculopathy, likely a MSK pain. Recommend PT, OTCs, call if no better CAD: Remains asx, request referral to cardiology. Will do. RTC as a schedule 03-2016

## 2016-01-15 NOTE — Progress Notes (Signed)
Pre visit review using our clinic review tool, if applicable. No additional management support is needed unless otherwise documented below in the visit note. 

## 2016-01-15 NOTE — Patient Instructions (Addendum)
Will refer you to a PT  Tylenol  500 mg OTC 2 tabs a day every 8 hours as needed for pain  Or  IBUPROFEN (Advil or Motrin) 200 mg 2 tablets every 6 hours as needed for pain.  Always take it with food because may cause gastritis and ulcers.  If you notice nausea, stomach pain, change in the color of stools --->  Stop the medicine and let us know    Doubt radiculopathy or lumbar spine pain Likely a strain in the muscles of the leg

## 2016-01-16 DIAGNOSIS — Z09 Encounter for follow-up examination after completed treatment for conditions other than malignant neoplasm: Secondary | ICD-10-CM | POA: Insufficient documentation

## 2016-01-16 NOTE — Assessment & Plan Note (Signed)
Right leg pain: Given description of sx, doubt radiculopathy, likely a MSK pain. Recommend PT, OTCs, call if no better CAD: Remains asx, request referral to cardiology. Will do. RTC as a schedule 03-2016

## 2016-01-24 ENCOUNTER — Encounter: Payer: Self-pay | Admitting: Rehabilitative and Restorative Service Providers"

## 2016-01-24 ENCOUNTER — Ambulatory Visit (INDEPENDENT_AMBULATORY_CARE_PROVIDER_SITE_OTHER): Payer: Commercial Managed Care - HMO | Admitting: Rehabilitative and Restorative Service Providers"

## 2016-01-24 DIAGNOSIS — M79604 Pain in right leg: Secondary | ICD-10-CM

## 2016-01-24 DIAGNOSIS — R29898 Other symptoms and signs involving the musculoskeletal system: Secondary | ICD-10-CM

## 2016-01-24 DIAGNOSIS — M791 Myalgia, unspecified site: Secondary | ICD-10-CM

## 2016-01-24 NOTE — Therapy (Signed)
[place], [place], [address] Phone: [phone]   Fax:  [phone]  Physical Therapy Evaluation  Patient Details  Name: Kara Wheeler MRN: [id number] Date of Birth: August 11, 1946 Referring Provider: Dr. Kathlene November  Encounter Date: 01/24/2016      PT End of Session - 01/24/16 1506    Visit Number 1   Number of Visits 12   Date for PT Re-Evaluation 03/06/16   PT Start Time 1426   PT Stop Time 1518   PT Time Calculation (min) 52 min   Activity Tolerance Patient tolerated treatment well      Past Medical History  Diagnosis Date  . Coronary atherosclerosis of native coronary artery 2005    Taxus drug-eluting stent to treat severe stenosis in the proximal LAD  . Mixed dyslipidemia   . Osteoarthritis   . HTN (hypertension)   . Ankle fracture, right 2013    Past Surgical History  Procedure Laterality Date  . Coronary stent placement  11/2003    LAD  . Tubal ligation Bilateral 1980  . Cervical cone biopsy  1978    benign    There were no vitals filed for this visit.       Subjective Assessment - 01/24/16 1415    Subjective Patient reports that she experienced pain in the Rt LE the morning of 12/25/15. She has continued to have pain in the Rt LE when she gets up from horizontal to verticle after she had been lying down for more than an hour or two. She is OK after a few minutes of walking and moving about. The remainder of the day she has no pain or limitation.    Pertinent History denies previous similar problems. 8/13 fx of Rt ankle with walking boot and PT with good resolution.    Diagnostic tests none    Patient Stated Goals get rid of the pain in the mornings   Currently in Pain? No/denies   Pain Score --  am - 7/10 resolves 5-10 min with movement and walking    Pain Location Leg  thigh    Pain Orientation Right;Posterior   Pain Type Acute pain   Pain Radiating Towards from hip  down back of the Rt leg    Pain Onset More than a month ago   Pain Frequency Intermittent   Aggravating Factors  standing after having been lying down > ~ 1 hour    Pain Relieving Factors moving             [facility] PT Assessment - 01/24/16 0001    Assessment   Medical Diagnosis Rt LE pain    Referring Provider Dr. Kathlene November   Onset Date/Surgical Date 12/25/15   Hand Dominance Right   Next MD Visit to call as needed    Prior Therapy none   Precautions   Precautions None   Balance Screen   Has the patient fallen in the past 6 months No   Has the patient had a decrease in activity level because of a fear of falling?  No   Is the patient reluctant to leave their home because of a fear of falling?  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  Leisure household chores; cooking; silver sneekers 3 days/wk inc exercise bands/weights/balls and at the pool 3 days/wk water aerobics 45 min; ball room dancing    Observation/Other Assessments   Focus on Therapeutic Outcomes (FOTO)  31% limitation    Sensation   Additional Comments WFL's per pt report    Posture/Postural Control   Posture Comments head forward; shoulders rounded   AROM   Overall AROM Comments WNL's spine and bilat LE's    Strength   Overall Strength Comments 5/5 bilat LE's except Rt hip extension 4+/5    Flexibility   Hamstrings 80 deg bilat tighter Rt than Lt    Quadriceps WFL's bilat    ITB WFL's bilat    Piriformis tight Rt    Palpation   Palpation comment tightness Rt hip abductors and hamstring compared to Lt    Functional Gait  Assessment   Gait assessed  --  WFL's                    [facility] Adult PT Treatment/Exercise - 01/24/16 0001    Self-Care   Self-Care --  discussed sleeping position - will  avoid prone with leg up    Knee/Hip Exercises: Stretches   Passive Hamstring Stretch 3 reps;30 seconds   Passive Hamstring Stretch Limitations repeat 3 reps with 30 sec hold with LE adducted to stretch medial HS    Piriformis Stretch 3 reps;30 seconds  travell   Piriformis Stretch Limitations repeat 30 sec 3 reps knee to opposite shoulder    Moist Heat Therapy   Number Minutes Moist Heat 15 Minutes   Moist Heat Location Hip;Knee  posterior thigh    Electrical Stimulation   Electrical Stimulation Location piriformis to hamstring Rt    Electrical Stimulation Action IFC   Electrical Stimulation Parameters  to tolerance   Electrical Stimulation Goals Pain;Tone                PT Education - 01/24/16 1453    Education provided Yes   Education Details HEP; TENS    Person(s) Educated Patient   Methods Explanation;Demonstration;Tactile cues;Verbal cues;Handout   Comprehension Verbalized understanding;Returned demonstration;Verbal cues required;Tactile cues required             PT Long Term Goals - 01/24/16 1511    PT LONG TERM GOAL #1   Title Improve tissue extensibility Rt LE to palpation and with stretching 03/06/16   Time 6   Period Weeks   Status New   PT LONG TERM GOAL #2   Title Eliminate moring pain Rt LE 03/06/16   Time 6   Period Weeks   Status New   PT LONG TERM GOAL #3   Title Independent in HEP 03/06/16   Time 6   Period Weeks   Status New   PT LONG TERM GOAL #4   Title Improve FOTO to </= 25% limitation 03/06/16   Time 6   Period Weeks   Status New               Plan - 01/24/16 1506    Clinical Impression Statement Patient presents with Rt LE pain in posterior hip and hamstring upon initially getting up from lying down. She has no pain or limitatioin during the day. Lumbar spine is negative for radiculaar symptoms. Patient has some deep tightness to palpation through the piriformis/glut med/hamstrings on Rt compared to Lt. She reports that she  sleeps on her stomach - likely with one hip and knee  flexed and rotated at side. This may contribute to current symptoms. She has myofacial pain and limitations Rt posterior hip and thigh which we will address with stretching; modalities; home TENS unit; manual work and TDN as indicated.    Rehab Potential Good   PT Frequency 2x / week   PT Duration 6 weeks   PT Treatment/Interventions Patient/family education;ADLs/Self Care Home Management;Cryotherapy;Electrical Stimulation;Iontophoresis 4mg /ml Dexamethasone;Moist Heat;Ultrasound;Neuromuscular re-education;Manual techniques;Dry needling;Therapeutic activities;Therapeutic exercise   PT Next Visit Plan stretching and strengthening as indicated; manual work v TDN Rt piriformis and hamstrings; modalities as indicated    Consulted and Agree with Plan of Care Patient      Patient will benefit from skilled therapeutic intervention in order to improve the following deficits and impairments:  Pain, Decreased mobility, Increased fascial restricitons, Decreased strength, Decreased activity tolerance  Visit Diagnosis: Pain In Right Leg - Plan: PT plan of care cert/re-cert  Myalgia - Plan: PT plan of care cert/re-cert  Other symptoms and signs involving the musculoskeletal system - Plan: PT plan of care cert/re-cert     Problem List Patient Active Problem List   Diagnosis Date Noted  . PCP NOTES >>>>>>>>>>>>>>>> 01/16/2016  . Dyslipidemia 04/24/2010  . ESSENTIAL HYPERTENSION, BENIGN 04/24/2010  . CORONARY ATHEROSCLEROSIS NATIVE CORONARY ARTERY 04/24/2010    [place] Nilda Simmer PT, MPH  01/24/2016, 3:16 PM  [facility] [place], [place], [address] Phone: [phone]   Fax:  [phone]  Name: Kara Wheeler MRN: [id number] Date of Birth: 11-Mar-1946

## 2016-01-24 NOTE — Patient Instructions (Signed)
HIP: Hamstrings - Supine    Place strap around foot. Raise leg up, keep knee straight. Hold _30__ seconds. __3_ reps per set, __2-3_ sets per day   Medial hamstring Stretch:  (Strap)    Strap around opposite foot, pull across only as far as possible with shoulders on mat. Hold for _30 sec  Repeat __3__ times right leg    Piriformis Stretch two positions for stretch    First - Place right foot flat on bed across the left thigh; pull right knee across body. Second - Lying on back, pull right knee toward opposite shoulder. Hold _30___ seconds. Repeat __3__ times. Do _2-3___ sessions per day.   TENS UNIT: This is helpful for muscle pain and spasm.   Search and Purchase a TENS 7000 2nd edition at www.tenspros.com. It should be less than $30.     TENS unit instructions: Do not shower or bathe with the unit on Turn the unit off before removing electrodes or batteries If the electrodes lose stickiness add a drop of water to the electrodes after they are disconnected from the unit and place on plastic sheet. If you continued to have difficulty, call the TENS unit company to purchase more electrodes. Do not apply lotion on the skin area prior to use. Make sure the skin is clean and dry as this will help prolong the life of the electrodes. After use, always check skin for unusual red areas, rash or other skin difficulties. If there are any skin problems, does not apply electrodes to the same area. Never remove the electrodes from the unit by pulling the wires. Do not use the TENS unit or electrodes other than as directed. Do not change electrode placement without consultating your therapist or physician. Keep 2 fingers with between each electrode.

## 2016-01-29 ENCOUNTER — Encounter: Payer: Self-pay | Admitting: Rehabilitative and Restorative Service Providers"

## 2016-01-29 ENCOUNTER — Ambulatory Visit (INDEPENDENT_AMBULATORY_CARE_PROVIDER_SITE_OTHER): Payer: Commercial Managed Care - HMO | Admitting: Rehabilitative and Restorative Service Providers"

## 2016-01-29 ENCOUNTER — Other Ambulatory Visit: Payer: Self-pay | Admitting: Internal Medicine

## 2016-01-29 DIAGNOSIS — M791 Myalgia, unspecified site: Secondary | ICD-10-CM

## 2016-01-29 DIAGNOSIS — R29898 Other symptoms and signs involving the musculoskeletal system: Secondary | ICD-10-CM | POA: Diagnosis not present

## 2016-01-29 DIAGNOSIS — M79604 Pain in right leg: Secondary | ICD-10-CM | POA: Diagnosis not present

## 2016-01-29 NOTE — Therapy (Signed)
[place], [place], [address] Phone: [phone]   Fax:  [phone]  Physical Therapy Treatment  Patient Details  Name: Kara Wheeler MRN: [id number] Date of Birth: [birth year] Referring Provider: Dr. Kathlene November  Encounter Date: 01/29/2016      PT End of Session - 01/29/16 1527    Visit Number 2   Number of Visits 12   Date for PT Re-Evaluation 03/06/16   PT Start Time [id number]   PT Stop Time 1615   PT Time Calculation (min) 54 min   Activity Tolerance Patient tolerated treatment well      Past Medical History  Diagnosis Date  . Coronary atherosclerosis of native coronary artery 2005    Taxus drug-eluting stent to treat severe stenosis in the proximal LAD  . Mixed dyslipidemia   . Osteoarthritis   . HTN (hypertension)   . Ankle fracture, right 2013    Past Surgical History  Procedure Laterality Date  . Coronary stent placement  11/2003    LAD  . Tubal ligation Bilateral 1980  . Cervical cone biopsy  1978    benign    There were no vitals filed for this visit.      Subjective Assessment - 01/29/16 1521    Subjective patient reports some improvement in the Rt LE in the morning especially in the posterior knee but still has pain in the hips mostly in the Lt hip. She has modified her sleep positioins and is doing pretty good with that. She can see some improvement. The back of her knee area isn't hurting any more when she gets up.    Currently in Pain? No/denies                         [facility] Adult PT Treatment/Exercise - 01/29/16 0001    Knee/Hip Exercises: Stretches   Passive Hamstring Stretch 3 reps;60 seconds   Passive Hamstring Stretch Limitations repeat 3 reps with 30 sec hold with LE adducted to stretch medial HS    Piriformis Stretch 3 reps;30 seconds   Piriformis Stretch Limitations repeat 30 sec 3 reps knee to opposite shoulder    Moist Heat Therapy   Number Minutes Moist Heat 15 Minutes   Moist Heat Location Hip;Knee  posterior thigh    Electrical Stimulation   Electrical Stimulation Location piriformis to hamstring Rt    Electrical Stimulation Action IFC   Electrical Stimulation Parameters to tolerance   Electrical Stimulation Goals Pain;Tone   Manual Therapy   Soft tissue mobilization deep tissue work through the piriformis and hamstring Rt    Myofascial Release piriformis          Trigger Point Dry Needling - 01/29/16 1603    Consent Given? Yes   Education Handout Provided Yes   Piriformis Response Twitch response elicited;Palpable increased muscle length              PT Education - 01/29/16 1535    Education provided Yes   Education Details HEP; TDN   Person(s) Educated Patient   Methods Explanation   Comprehension Verbalized understanding             PT Long Term Goals - 01/29/16 1528    PT LONG TERM GOAL #1   Title Improve tissue extensibility Rt LE to palpation and with stretching 03/06/16   Time 6   Period Weeks   Status On-going   PT LONG TERM GOAL #  2   Title Eliminate morning pain Rt LE 03/06/16   Time 6   Period Weeks   Status On-going   PT LONG TERM GOAL #3   Title Independent in HEP 03/06/16   Time 6   Period Weeks   Status On-going   PT LONG TERM GOAL #4   Title Improve FOTO to </= 25% limitation 03/06/16   Time 6   Period Weeks   Status On-going               Plan - 01/29/16 1604    Clinical Impression Statement Positive response to initial treatment. Less pain in the posterior knee/distal hamstring area today compared to last week. No pain when initially standing. She ocntinmues to have the pain in the Rt >>Lt hip upon standing first thing in the morning. Incresaed hold for stretches and added TDN/manual work today.    Rehab Potential Good   PT Frequency 2x / week   PT Duration 6 weeks   PT Treatment/Interventions Patient/family education;ADLs/Self Care Home  Management;Cryotherapy;Electrical Stimulation;Iontophoresis 4mg /ml Dexamethasone;Moist Heat;Ultrasound;Neuromuscular re-education;Manual techniques;Dry needling;Therapeutic activities;Therapeutic exercise   PT Next Visit Plan stretching and strengthening as indicated; manual work v TDN Rt piriformis and hamstrings; modalities as indicated - assess response to TDN - add posterior hip strengthening    Consulted and Agree with Plan of Care Patient      Patient will benefit from skilled therapeutic intervention in order to improve the following deficits and impairments:  Pain, Decreased mobility, Increased fascial restricitons, Decreased strength, Decreased activity tolerance  Visit Diagnosis: Pain In Right Leg  Myalgia  Other symptoms and signs involving the musculoskeletal system     Problem List Patient Active Problem List   Diagnosis Date Noted  . PCP NOTES >>>>>>>>>>>>>>>> 01/16/2016  . Dyslipidemia 04/24/2010  . ESSENTIAL HYPERTENSION, BENIGN 04/24/2010  . CORONARY ATHEROSCLEROSIS NATIVE CORONARY ARTERY 04/24/2010    Celyn Nilda Simmer PT, MPH  01/29/2016, 4:07 PM  [facility] [place], [place], [address] Phone: [phone]   Fax:  [phone]  Name: ALEXISMARIE HUNNEL MRN: [id number] Date of Birth: [birth year]

## 2016-01-29 NOTE — Patient Instructions (Signed)

## 2016-01-31 ENCOUNTER — Ambulatory Visit (INDEPENDENT_AMBULATORY_CARE_PROVIDER_SITE_OTHER): Payer: Commercial Managed Care - HMO | Admitting: Physical Therapy

## 2016-01-31 DIAGNOSIS — M791 Myalgia, unspecified site: Secondary | ICD-10-CM

## 2016-01-31 DIAGNOSIS — M79604 Pain in right leg: Secondary | ICD-10-CM

## 2016-01-31 DIAGNOSIS — R29898 Other symptoms and signs involving the musculoskeletal system: Secondary | ICD-10-CM | POA: Diagnosis not present

## 2016-01-31 NOTE — Therapy (Signed)
K[place], [place], [address] Phone: [phone]   Fax:  [phone]  Physical Therapy Treatment  Patient Details  Name: Kara Wheeler MRN: [id number] Date of Birth: Sep 23, 1945 Referring Provider: Dr. Kathlene November  Encounter Date: 01/31/2016      PT End of Session - 01/31/16 1522    Visit Number 3   Number of Visits 12   Date for PT Re-Evaluation 03/06/16   PT Start Time [id number]   PT Stop Time [id number]   PT Time Calculation (min) 40 min      Past Medical History  Diagnosis Date  . Coronary atherosclerosis of native coronary artery 2005    Taxus drug-eluting stent to treat severe stenosis in the proximal LAD  . Mixed dyslipidemia   . Osteoarthritis   . HTN (hypertension)   . Ankle fracture, right 2013    Past Surgical History  Procedure Laterality Date  . Coronary stent placement  11/2003    LAD  . Tubal ligation Bilateral 1980  . Cervical cone biopsy  1978    benign    There were no vitals filed for this visit.      Subjective Assessment - 01/31/16 1517    Subjective Pt reports it is easier to get out of bed. Pt reports she is completing HEP 2x/day. Pt reports the TDN caused some soreness the following day, but she feels it helped to resolve some of the pain down Rt leg.    Patient Stated Goals get rid of the pain in the mornings   Currently in Pain? No/denies   Pain Score --  (up to 5/10 with getting out of bed)            [facility] PT Assessment - 01/31/16 0001    Assessment   Medical Diagnosis Rt LE pain    Referring Provider Dr. Kathlene November   Onset Date/Surgical Date 12/25/15   Hand Dominance Right   Next MD Visit PRN   Flexibility   Hamstrings 100 deg bilat          [facility] Adult PT Treatment/Exercise - 01/31/16 0001    Exercises   Exercises Knee/Hip   Knee/Hip Exercises: Stretches   Passive Hamstring Stretch Right;Left;2 reps;60 seconds   Piriformis Stretch Right;Left;2  reps;60 seconds   Piriformis Stretch Limitations (shown variations )   Other Knee/Hip Stretches IT band stretch with strap. x 60 sec x 2 each leg   Knee/Hip Exercises: Aerobic   Nustep L4: 5 min    Knee/Hip Exercises: Supine   Single Leg Bridge Strengthening;Right;Left;1 set;5 reps   Other Supine Knee/Hip Exercises bridge with legs on green therapy ball x 10 reps    Manual Therapy   Manual therapy comments Pin and stretch through Rt hamstring and deep rotators.    Soft tissue mobilization deep tissue work through the piriformis and hamstring Rt                 PT Education - 01/31/16 1557    Education provided Yes   Education Details HEP   Person(s) Educated Patient   Methods Explanation;Handout   Comprehension Verbalized understanding;Returned demonstration             PT Long Term Goals - 01/29/16 1528    PT LONG TERM GOAL #1   Title Improve tissue extensibility Rt LE to palpation and with stretching 03/06/16   Time 6   Period Weeks   Status On-going  PT LONG TERM GOAL #2   Title Eliminate morning pain Rt LE 03/06/16   Time 6   Period Weeks   Status On-going   PT LONG TERM GOAL #3   Title Independent in HEP 03/06/16   Time 6   Period Weeks   Status On-going   PT LONG TERM GOAL #4   Title Improve FOTO to </= 25% limitation 03/06/16   Time 6   Period Weeks   Status On-going               Plan - 01/31/16 1703    Clinical Impression Statement Pt had positive response to TDN.  Pt had no pain in Rt hamstring today, but there was notable tightness in Rt lateral hamstring and Rt glute med.  Pt tolerated all of treatment without production of symptoms.  Modalities held at pt's request. Progressing well towards goals.    Rehab Potential Good   PT Frequency 2x / week   PT Duration 6 weeks   PT Treatment/Interventions Patient/family education;ADLs/Self Care Home Management;Cryotherapy;Electrical Stimulation;Iontophoresis 4mg /ml Dexamethasone;Moist  Heat;Ultrasound;Neuromuscular re-education;Manual techniques;Dry needling;Therapeutic activities;Therapeutic exercise   PT Next Visit Plan Add posterior hip strengthening.    Consulted and Agree with Plan of Care Patient      Patient will benefit from skilled therapeutic intervention in order to improve the following deficits and impairments:  Pain, Decreased mobility, Increased fascial restricitons, Decreased strength, Decreased activity tolerance  Visit Diagnosis: Pain In Right Leg  Myalgia  Other symptoms and signs involving the musculoskeletal system     Problem List Patient Active Problem List   Diagnosis Date Noted  . PCP NOTES >>>>>>>>>>>>>>>> 01/16/2016  . Dyslipidemia 04/24/2010  . ESSENTIAL HYPERTENSION, BENIGN 04/24/2010  . CORONARY ATHEROSCLEROSIS NATIVE CORONARY ARTERY 04/24/2010   Kara Wheeler, PTA 01/31/2016 5:10 PM  [place], [place], [address] Phone: [phone]   Fax:  [phone]  Name: Kara Wheeler MRN: [id number] Date of Birth: 15-Jan-1946

## 2016-01-31 NOTE — Patient Instructions (Signed)
Outer Hip Stretch: Reclined IT Band Stretch (Strap)    Strap around opposite foot, pull across only as far as possible with shoulders on mat. Hold for __30__ seconds. Repeat _2-3___ times each leg.   [facility] at [facility] [place], [place] [address]  [phone] (office) [phone] (fax)

## 2016-02-05 ENCOUNTER — Encounter: Payer: Self-pay | Admitting: Physical Therapy

## 2016-02-05 ENCOUNTER — Ambulatory Visit (INDEPENDENT_AMBULATORY_CARE_PROVIDER_SITE_OTHER): Payer: Commercial Managed Care - HMO | Admitting: Physical Therapy

## 2016-02-05 DIAGNOSIS — R29898 Other symptoms and signs involving the musculoskeletal system: Secondary | ICD-10-CM | POA: Diagnosis not present

## 2016-02-05 DIAGNOSIS — M791 Myalgia, unspecified site: Secondary | ICD-10-CM

## 2016-02-05 DIAGNOSIS — M79604 Pain in right leg: Secondary | ICD-10-CM | POA: Diagnosis not present

## 2016-02-05 NOTE — Patient Instructions (Signed)
Pelvic Press     Place hands under belly between navel and pubic bone, palms up. Feel pressure on hands. Increase pressure on hands by pressing pelvis down. This is NOT a pelvic tilt. Hold __5_ seconds. Relax. Repeat _10__ times. Once a day.  KNEE: Flexion - Prone - place hands under hips   Hold pelvic press. Bend knee, then return the foot down. Repeat on opposite leg. Do not raise hips. _10__ reps per set. When this is mastered, pull both heels up at same time, x 10 reps.  Once a day  Leg Lift: One-Leg   Press pelvis down. Keep knee straight; lengthen and lift one leg (from waist). Do not twist body. Keep other leg down. Hold _1__ second. Relax. Repeat 10 time. Repeat with other leg. Once a day  HIP: Extension / KNEE: Flexion - have hands under hips     Hold pelvic press. Bend knee, squeeze glutes. Raise leg up  10___ reps per set, _1__ sets per day, _1__ time a day.   [facility] at [facility] [place], [place] [address]  [phone] (office) [phone] (fax)

## 2016-02-05 NOTE — Therapy (Signed)
[place], [place], [address] Phone: [phone]   Fax:  [phone]  Physical Therapy Treatment  Patient Details  Name: Kara Wheeler MRN: [id number] Date of Birth: [birth year] Referring Provider: Dr Kathlene November  Encounter Date: 02/05/2016      PT End of Session - 02/05/16 1421    Visit Number 4   Number of Visits 12   Date for PT Re-Evaluation 03/06/16   PT Start Time 1421   PT Stop Time 1511   PT Time Calculation (min) 50 min   Activity Tolerance Patient tolerated treatment well      Past Medical History  Diagnosis Date  . Coronary atherosclerosis of native coronary artery 2005    Taxus drug-eluting stent to treat severe stenosis in the proximal LAD  . Mixed dyslipidemia   . Osteoarthritis   . HTN (hypertension)   . Ankle fracture, right 2013    Past Surgical History  Procedure Laterality Date  . Coronary stent placement  11/2003    LAD  . Tubal ligation Bilateral 1980  . Cervical cone biopsy  1978    benign    There were no vitals filed for this visit.      Subjective Assessment - 02/05/16 1422    Subjective Pt states she feels about the same as last week.  There was one day she got up and felt great however it didn't last. Went back to yoga today.    Patient Stated Goals get rid of the pain in the mornings   Currently in Pain? No/denies  no pain once she gets up and moves.    Aggravating Factors  lying down for multiple hrs.             [facility] PT Assessment - 02/05/16 0001    Assessment   Medical Diagnosis Rt LE pain    Referring Provider Dr Kathlene November   Onset Date/Surgical Date 12/25/15   Hand Dominance Right   Precautions   Precautions None                     [facility] Adult PT Treatment/Exercise - 02/05/16 0001    Exercises   Exercises Knee/Hip   Knee/Hip Exercises: Stretches   Other Knee/Hip Stretches bilat KTC, then [place]   Other Knee/Hip  Stretches LTR   Knee/Hip Exercises: Aerobic   Nustep L5: 6 min    Knee/Hip Exercises: Prone   Other Prone Exercises lower body pelvic press series.    Modalities   Modalities Ultrasound   Ultrasound   Ultrasound Location Lt SIJ   Ultrasound Parameters 50%, 1.21mz, 1.0w/cm2   Ultrasound Goals Pain   Manual Therapy   Manual Therapy Passive ROM;Soft tissue mobilization;Joint mobilization   Joint Mobilization lumbar mobs CPA/bilat UPA no pain, good mobility, pain in Lt SIJ with pressure   Soft tissue mobilization Rt gluts piriformis, QL and lumbar paraspinals feel more loose.    Myofascial Release Rt multifidi doesn't contract as much as the Lt   Passive ROM Rt hip IR/ER                PT Education - 02/05/16 1445    Education provided Yes   Education Details HEP pelvic press series - lower body   Person(s) Educated Patient   Methods Demonstration;Explanation;Handout   Comprehension Returned demonstration;Verbalized understanding             PT Long Term Goals - 02/05/16 1425  PT LONG TERM GOAL #1   Title Improve tissue extensibility Rt LE to palpation and with stretching 03/06/16   Time --   Period --   Status Achieved   PT LONG TERM GOAL #2   Title Eliminate morning pain Rt LE 03/06/16   Time 6   Period Weeks   Status On-going   PT LONG TERM GOAL #3   Title Independent in HEP 03/06/16   Time 6   Period Weeks   Status On-going   PT LONG TERM GOAL #4   Title Improve FOTO to </= 25% limitation 03/06/16   Time 6   Period Weeks   Status On-going               Plan - 02/05/16 1512    Clinical Impression Statement Kara Wheeler is making slow progress, she is still frustrated that she is having pain in the AM however it is not as bad as initially.  She is open to looking at getting a new mattress.  She has met one goal, the muscles in her Rt buttocks and low back area feeling better.    Rehab Potential Good   PT Frequency 2x / week   PT Duration 6 weeks    PT Treatment/Interventions Patient/family education;ADLs/Self Care Home Management;Cryotherapy;Electrical Stimulation;Iontophoresis 12m/ml Dexamethasone;Moist Heat;Ultrasound;Neuromuscular re-education;Manual techniques;Dry needling;Therapeutic activities;Therapeutic exercise   PT Next Visit Plan assess tolerance to new HEP and results of [place]    Consulted and Agree with Plan of Care Patient      Patient will benefit from skilled therapeutic intervention in order to improve the following deficits and impairments:  Pain, Decreased mobility, Increased fascial restricitons, Decreased strength, Decreased activity tolerance  Visit Diagnosis: Pain In Right Leg  Myalgia  Other symptoms and signs involving the musculoskeletal system     Problem List Patient Active Problem List   Diagnosis Date Noted  . PCP NOTES >>>>>>>>>>>>>>>> 01/16/2016  . Dyslipidemia 04/24/2010  . ESSENTIAL HYPERTENSION, BENIGN 04/24/2010  . CORONARY ATHEROSCLEROSIS NATIVE CORONARY ARTERY 04/24/2010    SJeral PinchPT  02/05/2016, 3:14 PM  [facility] [address]Phone: [phone]  Fax:  [phone] Name: Kara HAPKEMRN: [id number] of Birth: [birth year]

## 2016-02-08 ENCOUNTER — Ambulatory Visit (INDEPENDENT_AMBULATORY_CARE_PROVIDER_SITE_OTHER): Payer: Commercial Managed Care - HMO | Admitting: Physical Therapy

## 2016-02-08 DIAGNOSIS — R29898 Other symptoms and signs involving the musculoskeletal system: Secondary | ICD-10-CM | POA: Diagnosis not present

## 2016-02-08 DIAGNOSIS — M791 Myalgia, unspecified site: Secondary | ICD-10-CM

## 2016-02-08 DIAGNOSIS — M79604 Pain in right leg: Secondary | ICD-10-CM

## 2016-02-08 NOTE — Therapy (Signed)
[place], [place], [address] Phone: [phone]   Fax:  [phone]  Physical Therapy Treatment  Patient Details  Name: Kara Wheeler MRN: [id number] Date of Birth: 1946/06/25 Referring Provider: Dr. Kathlene November   Encounter Date: 02/08/2016      PT End of Session - 02/08/16 1537    Visit Number 5   Number of Visits 12   Date for PT Re-Evaluation 03/06/16   PT Start Time [id number]   PT Stop Time 1615   PT Time Calculation (min) 45 min   Activity Tolerance Patient tolerated treatment well      Past Medical History  Diagnosis Date  . Coronary atherosclerosis of native coronary artery 2005    Taxus drug-eluting stent to treat severe stenosis in the proximal LAD  . Mixed dyslipidemia   . Osteoarthritis   . HTN (hypertension)   . Ankle fracture, right 2013    Past Surgical History  Procedure Laterality Date  . Coronary stent placement  11/2003    LAD  . Tubal ligation Bilateral 1980  . Cervical cone biopsy  1978    benign    There were no vitals filed for this visit.      Subjective Assessment - 02/08/16 1546    Subjective Pt feels she made a lot of progress first 2 weeks, but hasn't noticed as much change over these last 2 wks.  She noticed the pain in morning doesn't last as long as it use to.    Currently in Pain? No/denies  pain first thing in morning up past 5/10.             [facility] PT Assessment - 02/08/16 0001    Assessment   Medical Diagnosis Rt LE pain    Referring Provider Dr. Kathlene November    Onset Date/Surgical Date 12/25/15   Hand Dominance Right          [facility] Adult PT Treatment/Exercise - 02/08/16 0001    Knee/Hip Exercises: Stretches   Passive Hamstring Stretch Right;Left;30 seconds;2 reps   Passive Hamstring Stretch Limitations (various angles (adducted/ abdct)   Other Knee/Hip Stretches childs pose x 30 sec   Knee/Hip Exercises: Aerobic   Nustep L5: 7 min    Knee/Hip Exercises: Prone   Other Prone Exercises Pelvic press x 5 sec hold x 10 reps (required mutiple cues to engage proper muscles. Repeated with unilateral knee bend x 10 reps each leg, then with hip ext(knee straight) .    Modalities   Modalities Ultrasound;Buyer, retail Rt SI / deep rotators    Chartered certified accountant combo [place]   Electrical Stimulation Parameters to tolerance    Ultrasound   Ultrasound Location Rt SI joint    Ultrasound Parameters 100%, 1.2 w/cm2,    Ultrasound Goals Pain   Manual Therapy   Soft tissue mobilization Rt / Lt glutes/    Myofascial Release Rt QL    Passive ROM Rt hip IR/ER           PT Long Term Goals - 02/05/16 1425    PT LONG TERM GOAL #1   Title Improve tissue extensibility Rt LE to palpation and with stretching 03/06/16   Time --   Period --   Status Achieved   PT LONG TERM GOAL #2   Title Eliminate morning pain Rt LE 03/06/16   Time 6   Period Weeks   Status  On-going   PT LONG TERM GOAL #3   Title Independent in HEP 03/06/16   Time 6   Period Weeks   Status On-going   PT LONG TERM GOAL #4   Title Improve FOTO to </= 25% limitation 03/06/16   Time 6   Period Weeks   Status On-going               Plan - 02/08/16 1806    Rehab Potential Good   PT Frequency 2x / week   PT Duration 6 weeks   PT Treatment/Interventions Patient/family education;ADLs/Self Care Home Management;Cryotherapy;Electrical Stimulation;Iontophoresis 4mg /ml Dexamethasone;Moist Heat;Ultrasound;Neuromuscular re-education;Manual techniques;Dry needling;Therapeutic activities;Therapeutic exercise   PT Next Visit Plan Assess response to combo.  Continue manual therapy and modalities as indicated.       Patient will benefit from skilled therapeutic intervention in order to improve the following deficits and impairments:  Pain, Decreased mobility, Increased fascial restricitons, Decreased  strength, Decreased activity tolerance  Visit Diagnosis: Pain In Right Leg  Myalgia  Other symptoms and signs involving the musculoskeletal system     Problem List Patient Active Problem List   Diagnosis Date Noted  . PCP NOTES >>>>>>>>>>>>>>>> 01/16/2016  . Dyslipidemia 04/24/2010  . ESSENTIAL HYPERTENSION, BENIGN 04/24/2010  . CORONARY ATHEROSCLEROSIS NATIVE CORONARY ARTERY 04/24/2010   [name], PTA 02/08/2016 6:08 PM  [place], [place], [address] Phone: [phone]   Fax:  [phone]  Name: Kara Wheeler MRN: [id number] Date of Birth: June 29, 1946

## 2016-02-13 ENCOUNTER — Ambulatory Visit (INDEPENDENT_AMBULATORY_CARE_PROVIDER_SITE_OTHER): Payer: Commercial Managed Care - HMO | Admitting: Physical Therapy

## 2016-02-13 DIAGNOSIS — R29898 Other symptoms and signs involving the musculoskeletal system: Secondary | ICD-10-CM

## 2016-02-13 DIAGNOSIS — M79604 Pain in right leg: Secondary | ICD-10-CM

## 2016-02-13 DIAGNOSIS — M791 Myalgia, unspecified site: Secondary | ICD-10-CM

## 2016-02-13 NOTE — Therapy (Signed)
[place], [place], [address] Phone: [phone]   Fax:  [phone]  Physical Therapy Treatment  Patient Details  Name: Kara Wheeler MRN: [id number] Date of Birth: July 20, 1946 Referring Provider: Dr. Kathlene November  Encounter Date: 02/13/2016      PT End of Session - 02/13/16 1105    Visit Number 6   Number of Visits 12   Date for PT Re-Evaluation 03/06/16   PT Start Time 1101   PT Stop Time 1145   PT Time Calculation (min) 44 min      Past Medical History  Diagnosis Date  . Coronary atherosclerosis of native coronary artery 2005    Taxus drug-eluting stent to treat severe stenosis in the proximal LAD  . Mixed dyslipidemia   . Osteoarthritis   . HTN (hypertension)   . Ankle fracture, right 2013    Past Surgical History  Procedure Laterality Date  . Coronary stent placement  11/2003    LAD  . Tubal ligation Bilateral 1980  . Cervical cone biopsy  1978    benign    There were no vitals filed for this visit.      Subjective Assessment - 02/13/16 1105    Subjective Pain varies in morning day to day.  Peg states, "in general it is improving, no longer using a cane first thing in morning" .    Currently in Pain? No/denies   Pain Score --  up 4/10 first thing in morning.     Pain Location Back  (SI joint area)   Pain Orientation Right   Aggravating Factors  lying down for multiple hours.    Pain Relieving Factors moving            [facility] PT Assessment - 02/13/16 0001    Assessment   Medical Diagnosis Rt LE pain    Referring Provider Dr. Kathlene November   Onset Date/Surgical Date 12/25/15   Hand Dominance Right   Flexibility   Hamstrings 100 deg bilat         [facility] Adult PT Treatment/Exercise - 02/13/16 0001    Knee/Hip Exercises: Stretches   Passive Hamstring Stretch Right;Left;30 seconds;2 reps   Passive Hamstring Stretch Limitations (various angles (adducted/ abdct)   Piriformis Stretch Right;Left;2 reps;60 seconds   Other Knee/Hip Stretches IT band stretch with strap. x 60 sec x 2 each leg, then adductor stretch    Knee/Hip Exercises: Aerobic   Nustep L5: 5 min    Electrical Stimulation   Electrical Stimulation Location Rt SI / deep rotators    Electrical Stimulation Action combo [place]   Electrical Stimulation Parameters to tolerance   Ultrasound   Ultrasound Location Rt SI/ piriformis    Ultrasound Parameters 100%, 1.2 w/cm2, 8 min    Ultrasound Goals Pain   Manual Therapy   Soft tissue mobilization Deep tissue/ TPR to Rt high hamstring/ sacroilliac ligament with active hip flexion to neutral; TPR to piriformis with active hip ER    Passive ROM Rt hip IR/ER (with deep pressure to the rotators)                PT Education - 02/13/16 1152    Education provided Yes   Education Details Self care: Pt educated on self massage with ball to Rt hip deep rotators in standing, and encouraged to use heat/ice to area 1x/day as well.    Person(s) Educated Patient   Methods Explanation   Comprehension Verbalized understanding  PT Long Term Goals - 02/05/16 1425    PT LONG TERM GOAL #1   Title Improve tissue extensibility Rt LE to palpation and with stretching 03/06/16   Time --   Period --   Status Achieved   PT LONG TERM GOAL #2   Title Eliminate morning pain Rt LE 03/06/16   Time 6   Period Weeks   Status On-going   PT LONG TERM GOAL #3   Title Independent in HEP 03/06/16   Time 6   Period Weeks   Status On-going   PT LONG TERM GOAL #4   Title Improve FOTO to </= 25% limitation 03/06/16   Time 6   Period Weeks   Status On-going               Plan - 02/13/16 1153    Clinical Impression Statement Pt is reporting decreased intensity and frequency of pain in Rt hip in morning.  Pt had localized soreness in Rt piriformis and sacroiliac ligametn with manual therapy.  Gradually progressing towards goals.    Rehab  Potential Good   PT Frequency 2x / week   PT Duration 6 weeks   PT Treatment/Interventions Patient/family education;ADLs/Self Care Home Management;Cryotherapy;Electrical Stimulation;Iontophoresis 4mg /ml Dexamethasone;Moist Heat;Ultrasound;Neuromuscular re-education;Manual techniques;Dry needling;Therapeutic activities;Therapeutic exercise   PT Next Visit Plan Continue manual therapy and modalities as indicated, as well as progressive core strengthening.    Consulted and Agree with Plan of Care Patient      Patient will benefit from skilled therapeutic intervention in order to improve the following deficits and impairments:  Pain, Decreased mobility, Increased fascial restricitons, Decreased strength, Decreased activity tolerance  Visit Diagnosis: Pain In Right Leg  Myalgia  Other symptoms and signs involving the musculoskeletal system     Problem List Patient Active Problem List   Diagnosis Date Noted  . PCP NOTES >>>>>>>>>>>>>>>> 01/16/2016  . Dyslipidemia 04/24/2010  . ESSENTIAL HYPERTENSION, BENIGN 04/24/2010  . CORONARY ATHEROSCLEROSIS NATIVE CORONARY ARTERY 04/24/2010   [name], PTA 02/13/2016 12:13 PM  [place], [place], [address] Phone: [phone]   Fax:  [phone]  Name: Kara Wheeler MRN: [id number] Date of Birth: [birth year]

## 2016-02-16 ENCOUNTER — Ambulatory Visit (INDEPENDENT_AMBULATORY_CARE_PROVIDER_SITE_OTHER): Payer: Commercial Managed Care - HMO | Admitting: Physical Therapy

## 2016-02-16 DIAGNOSIS — M791 Myalgia, unspecified site: Secondary | ICD-10-CM

## 2016-02-16 DIAGNOSIS — M79604 Pain in right leg: Secondary | ICD-10-CM | POA: Diagnosis not present

## 2016-02-16 DIAGNOSIS — R29898 Other symptoms and signs involving the musculoskeletal system: Secondary | ICD-10-CM

## 2016-02-16 NOTE — Therapy (Addendum)
[place], [place], [address] Phone: [phone]   Fax:  [phone]  Physical Therapy Treatment  Patient Details  Name: Kara Wheeler MRN: [id number] Date of Birth: 1946/02/01 Referring Provider: Dr. Kathlene November  Encounter Date: 02/16/2016      PT End of Session - 02/16/16 1429    Visit Number 7   Number of Visits 12   Date for PT Re-Evaluation 03/06/16   PT Start Time 2549   PT Stop Time 1443   PT Time Calculation (min) 40 min   Activity Tolerance Patient tolerated treatment well;No increased pain      Past Medical History  Diagnosis Date  . Coronary atherosclerosis of native coronary artery 2005    Taxus drug-eluting stent to treat severe stenosis in the proximal LAD  . Mixed dyslipidemia   . Osteoarthritis   . HTN (hypertension)   . Ankle fracture, right 2013    Past Surgical History  Procedure Laterality Date  . Coronary stent placement  11/2003    LAD  . Tubal ligation Bilateral 1980  . Cervical cone biopsy  1978    benign    There were no vitals filed for this visit.      Subjective Assessment - 02/16/16 1430    Subjective Pt reports the pain is still present in her Rt low back/hip when she arrises in morning.  Pain is now 4/10, last only a few moments then it is resolved for remainder of day. Continue to participate in yoga and silver sneakers without any symptoms.    Currently in Pain? No/denies   Pain Score 0-No pain         [facility] Adult PT Treatment/Exercise - 02/16/16 0001    Exercises   Exercises Lumbar;Knee/Hip   Lumbar Exercises: Stretches   Press Ups 3 reps   Lumbar Exercises: Prone   Other Prone Lumbar Exercises Prone pelvic press x 5 sec hold x 10 reps (VC/tactile cues for form); then pelvic press with unilateral knee flexion x 5 reps each leg, hip ext knee bent x 5 reps each leg, bilateral knee flexion x 10 reps, hip ext knee straight x 5 reps each leg.  (no pain. Improved multifidi engagement)    Lumbar Exercises: Quadruped   [place]/Old Horse 5 reps   Knee/Hip Exercises: Stretches   Passive Hamstring Stretch Right;Left;30 seconds;2 reps   Piriformis Stretch Right;Left;2 reps;60 seconds   Knee/Hip Exercises: Aerobic   Nustep L5: 5 min    Knee/Hip Exercises: Prone   Other Prone Exercises childs pose with lateral leans with hips x 2 reps each direction.    Modalities   Modalities Ultrasound   Ultrasound   Ultrasound Location Rt SI/ Piriformis   Ultrasound Parameters 100%, 1.2 w/cm2 x 8 min    Ultrasound Goals --  tightness    Manual Therapy   Joint Mobilization Rt hip PA mobs in hooklying    Soft tissue mobilization Deep tissue/ TPR to Rt high hamstring/ sacroilliac ligament with active hip flexion to neutral; TPR to piriformis with active hip ER    Myofascial Release Rt QL            PT Long Term Goals - 02/05/16 1425    PT LONG TERM GOAL #1   Title Improve tissue extensibility Rt LE to palpation and with stretching 03/06/16   Time --   Period --   Status Achieved   PT LONG TERM GOAL #2   Title  Eliminate morning pain Rt LE 03/06/16   Time 6   Period Weeks   Status On-going   PT LONG TERM GOAL #3   Title Independent in HEP 03/06/16   Time 6   Period Weeks   Status On-going   PT LONG TERM GOAL #4   Title Improve FOTO to </= 25% limitation 03/06/16   Time 6   Period Weeks   Status On-going               Plan - 02/16/16 1553    Clinical Impression Statement Pt had no localized soreness with manual therapy today, still some mild tightness in deep rotators.  Spoke at great length to pt regarding modifying sleep position and performing stretches prior to getting out of bed to see if this eases morning back pain.  Pt agreeable to try these strategies.     Rehab Potential Good   PT Frequency 2x / week   PT Duration 6 weeks   PT Treatment/Interventions Patient/family education;ADLs/Self Care Home  Management;Cryotherapy;Electrical Stimulation;Iontophoresis 50m/ml Dexamethasone;Moist Heat;Ultrasound;Neuromuscular re-education;Manual techniques;Dry needling;Therapeutic activities;Therapeutic exercise   PT Next Visit Plan Pt requests to hold therapy for 1 wk and try HEP along with sleep position modifications.  Will call to schedule if pain persists after this.    Consulted and Agree with Plan of Care Patient      Patient will benefit from skilled therapeutic intervention in order to improve the following deficits and impairments:  Pain, Decreased mobility, Increased fascial restricitons, Decreased strength, Decreased activity tolerance  Visit Diagnosis: Pain In Right Leg  Myalgia  Other symptoms and signs involving the musculoskeletal system     Problem List Patient Active Problem List   Diagnosis Date Noted  . PCP NOTES >>>>>>>>>>>>>>>> 01/16/2016  . Dyslipidemia 04/24/2010  . ESSENTIAL HYPERTENSION, BENIGN 04/24/2010  . CORONARY ATHEROSCLEROSIS NATIVE CORONARY ARTERY 04/24/2010   JKerin Perna PTA 02/16/2016 3:58 PM  [place] [address]Phone: [phone]  Fax:  [phone] Name: Kara GAYLORDMRN: [id number] of Birth: 8Mar 10, 1947  PHYSICAL THERAPY DISCHARGE SUMMARY  Visits from Start of Care: 7  Current functional level related to goals / functional outcomes: See last progress note for discharge status   Remaining deficits: See above   Education / Equipment: HEP Plan: Patient agrees to discharge.  Patient goals were partially met. Patient is being discharged due to not returning since the last visit.  ?????    Celyn P. HHelene KelpPT, MPH 07/22/16 7:16 AM

## 2016-03-20 ENCOUNTER — Other Ambulatory Visit: Payer: Self-pay | Admitting: Medical

## 2016-03-20 ENCOUNTER — Encounter: Payer: Self-pay | Admitting: Medical

## 2016-03-20 ENCOUNTER — Ambulatory Visit (HOSPITAL_BASED_OUTPATIENT_CLINIC_OR_DEPARTMENT_OTHER)
Admission: RE | Admit: 2016-03-20 | Discharge: 2016-03-20 | Disposition: A | Discharge status: Home / Self Care | Payer: Commercial Managed Care - HMO | Source: Ambulatory Visit | Attending: Medical | Admitting: Medical

## 2016-03-20 ENCOUNTER — Ambulatory Visit (INDEPENDENT_AMBULATORY_CARE_PROVIDER_SITE_OTHER): Payer: Commercial Managed Care - HMO | Admitting: Medical

## 2016-03-20 VITALS — BP 122/78 | HR 58 | Temp 98.0°F | Ht 64.0 in | Wt 160.2 lb

## 2016-03-20 DIAGNOSIS — M25551 Pain in right hip: Secondary | ICD-10-CM | POA: Insufficient documentation

## 2016-03-20 DIAGNOSIS — M79651 Pain in right thigh: Secondary | ICD-10-CM | POA: Diagnosis not present

## 2016-03-20 DIAGNOSIS — M4186 Other forms of scoliosis, lumbar region: Secondary | ICD-10-CM | POA: Insufficient documentation

## 2016-03-20 NOTE — Patient Instructions (Signed)
For your rt leg and hip region pain will get xray of hip and femur today.  For occasional pain that last more than 15 minutes recommend tylenol since that seems to help.  Will refer to sports medicine.Anderson Malta is our contact person on referrals in our office.)  Follow up with [place] as needed if Sports med delayed.

## 2016-03-20 NOTE — Progress Notes (Signed)
Pre visit review using our clinic review tool, if applicable. No additional management support is needed unless otherwise documented below in the visit note. 

## 2016-03-20 NOTE — Progress Notes (Signed)
Subjective:    Patient ID: Kara Wheeler, female    DOB: 03-04-1946, 70 y.o.   MRN: [id number]  HPI  Pt in states since beginning of May in the mornig she has leg pain. She describes entire rt  leg area has pain. She went to PT. Pt states she can function during the day and exercising does well. Her symptoms are appears to be in rt upper buttox area and rt si area. But she point to rt hamstring area as source of pain in early am. Also just recently she has left hamstring pain for about 3 weeks.   Pt was offered muscle relaxer when saw Dr. Larose Kells but she declined back then. Pt did not see sports medicine.   Pt has used tylenol and it does help. Only uses about one time a week.   On review pain will be present for 15 minutes. Remember one day and she did not have pain.  Rt hip area feels faint ache at time.      Review of Systems  Constitutional: Negative for chills, fatigue and fever.  Respiratory: Negative for cough, chest tightness, shortness of breath and wheezing.   Cardiovascular: Negative for chest pain and palpitations.  Musculoskeletal:       Rt leg pain. Maybe sciatica type pain?  Hematological: Negative for adenopathy. Does not bruise/bleed easily.  Psychiatric/Behavioral: Negative for behavioral problems and confusion.    Past Medical History:  Diagnosis Date  . Ankle fracture, right 2013  . Coronary atherosclerosis of native coronary artery 2005   Taxus drug-eluting stent to treat severe stenosis in the proximal LAD  . HTN (hypertension)   . Mixed dyslipidemia   . Osteoarthritis      Social History   Social History  . Marital status: Married    Spouse name: N/A  . Number of children: 1  . Years of education: N/A   Occupational History  . 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 Smoking status: Former Smoker    Packs/day: 0.25    Years: 4.00    Types: Cigarettes    Start date: 08/26/1962    Quit date: 08/26/1966  .  Smokeless tobacco: Never Used  . Alcohol use 3.0 oz/week    5 Glasses of wine per week  . Drug use: No     Comment: marijuana -- very little yrs ago  . Sexual activity: Yes    Partners: Male    Birth control/ protection: Surgical, Post-menopausal     Comment: tubal   Other Topics Concern  . Not on file   Social History Narrative   Household-- pt and husband (Mr [place])    Past Surgical History:  Procedure Laterality Date  . CERVICAL CONE BIOPSY  1978   benign  . CORONARY STENT PLACEMENT  11/2003   LAD  . TUBAL LIGATION Bilateral 1980    Family History  Problem Relation Age of Onset  . Heart attack Father 67    second at age 7  . Lymphoma Mother     Lymphoma  . Heart attack Paternal Grandmother   . Stroke Paternal Grandmother   . Heart attack Paternal Grandfather   . Stroke Paternal Grandfather   . Heart disease Brother   . Colon cancer Neg Hx   . Breast cancer Neg Hx   . Diabetes Neg Hx     No Known Allergies  Current Outpatient Prescriptions on File Prior to Visit  Medication Sig Dispense Refill  . aspirin 81 MG tablet Take 81 mg by mouth daily.      . Calcium Carbonate-Vitamin D (CALCIUM 600 + D PO) Take 1 tablet by mouth daily.      . Cholecalciferol (VITAMIN D-3 PO) Take 500 Units by mouth daily.     . fish oil-omega-3 fatty acids 1000 MG capsule Take 2 g by mouth daily.      . multivitamin (THERAGRAN) per tablet Take 1 tablet by mouth daily.      . rosuvastatin (CRESTOR) 20 MG tablet Take 1 tablet (20 mg total) by mouth daily. 30 tablet 6  . valsartan (DIOVAN) 160 MG tablet Take 1 tablet (160 mg total) by mouth daily. (Patient taking differently: Take 80 mg by mouth daily. ) 30 tablet 5   No current facility-administered medications on file prior to visit.     BP 122/78 (BP Location: Right Arm, Patient Position: Sitting, Cuff Size: Normal)   Pulse (!) 58   Temp 98 F (36.7 C) (Oral)   Ht 5\' 4"  (1.626 m)   Wt 160 lb 3.2 oz (72.7 kg)   SpO2 97%    BMI 27.50 kg/m       Objective:   Physical Exam  General- No acute distress. Pleasant patient. Neck- Full range of motion, no jvd Lungs- Clear, even and unlabored. Heart- regular rate and rhythm. Neurologic- CNII- XII grossly intact.  Back- no pain on palpation of lumbar region. No si tender. No pain on straight leg lift.  Lower ext- normal sensation l5-s1.      Assessment & Plan:  For your rt leg and hip region pain will get xray of hip and femur today.  For occasional pain that last more than 15 minutes recommend tylenol since that seems to help.  Will refer to sports medicine.Anderson Malta is our contact person on referrals in our office.)  Follow up with [place] as needed if Sports med delayed.   Jaskirat Zertuche, Percell Miller, PA-C

## 2016-03-21 ENCOUNTER — Telehealth: Payer: Self-pay | Admitting: Medical

## 2016-03-21 NOTE — Telephone Encounter (Signed)
Forgot to mention rt femur looks normal on xray

## 2016-03-22 ENCOUNTER — Ambulatory Visit (INDEPENDENT_AMBULATORY_CARE_PROVIDER_SITE_OTHER): Payer: Commercial Managed Care - HMO | Admitting: Family Medicine

## 2016-03-22 ENCOUNTER — Encounter: Payer: Self-pay | Admitting: Family Medicine

## 2016-03-22 ENCOUNTER — Telehealth: Payer: Self-pay

## 2016-03-22 DIAGNOSIS — M545 Low back pain, unspecified: Secondary | ICD-10-CM

## 2016-03-22 DIAGNOSIS — M79604 Pain in right leg: Secondary | ICD-10-CM

## 2016-03-22 NOTE — Patient Instructions (Signed)
I'm concerned you have an irritated nerve in your low back from arthritis, disc bulging. Do home exercises once a day for next 6 weeks (experiment with the ones in the handout). Take tylenol for baseline pain relief ( up to 1-2 extra strength tabs 3x/day, especially before you go to bed) Consider prednisone, physical therapy specifically for your back, MRI (call us if you want to try any of these). Heat is typically better than ice but you could use whichever feels better 15 minutes at a time 3-4 times a day. Stay as active as possible. Strengthening of low back muscles, abdominal musculature are key for long term pain relief. Follow up with me in 5-6 weeks for reevaluation.

## 2016-03-22 NOTE — Progress Notes (Signed)
Pt has seen results on MyChart and message also sent for patient to call back if any questions.

## 2016-03-22 NOTE — Telephone Encounter (Signed)
-----   Message from Mackie Pai, PA-C sent at 03/21/2016  8:48 PM EDT ----- Hip xrays show normal joint spaces. Mild scoliosis lumbar spine

## 2016-03-27 DIAGNOSIS — M545 Low back pain, unspecified: Secondary | ICD-10-CM | POA: Insufficient documentation

## 2016-03-27 NOTE — Progress Notes (Signed)
PCP: Kathlene November, MD Consultation requested by Mackie Pai PA-C Subjective:   HPI: Patient is a 70 y.o. female here for right hip, back pain.  Patient reports she's had pain from buttocks/back to back of knee since beginning of May. Worse with prolonged sitting. Also worse in the morning, better with standing and walking. Using tylenol occasionally. Sometimes has no pain - currently 0/10 level but can be sharp posteriorly. No numbness, skin changes.  Past Medical History:  Diagnosis Date  . Ankle fracture, right 2013  . Coronary atherosclerosis of native coronary artery 2005   Taxus drug-eluting stent to treat severe stenosis in the proximal LAD  . HTN (hypertension)   . Mixed dyslipidemia   . Osteoarthritis     Current Outpatient Prescriptions on File Prior to Visit  Medication Sig Dispense Refill  . aspirin 81 MG tablet Take 81 mg by mouth daily.      . Calcium Carbonate-Vitamin D (CALCIUM 600 + D PO) Take 1 tablet by mouth daily.      . Cholecalciferol (VITAMIN D-3 PO) Take 500 Units by mouth daily.     . fish oil-omega-3 fatty acids 1000 MG capsule Take 2 g by mouth daily.      . multivitamin (THERAGRAN) per tablet Take 1 tablet by mouth daily.      . rosuvastatin (CRESTOR) 20 MG tablet Take 1 tablet (20 mg total) by mouth daily. 30 tablet 6  . valsartan (DIOVAN) 160 MG tablet Take 1 tablet (160 mg total) by mouth daily. (Patient taking differently: Take 80 mg by mouth daily. ) 30 tablet 5   No current facility-administered medications on file prior to visit.     Past Surgical History:  Procedure Laterality Date  . CERVICAL CONE BIOPSY  1978   benign  . CORONARY STENT PLACEMENT  11/2003   LAD  . TUBAL LIGATION Bilateral 1980    No Known Allergies  Social History   Social History  . Marital status: Married    Spouse name: N/A  . Number of children: 1  . Years of education: N/A   Occupational History  . retired 2014-- [employer] Topics  . Smoking status: Former Smoker    Packs/day: 0.25    Years: 4.00    Types: Cigarettes    Start date: 08/26/1962    Quit date: 08/26/1966  . Smokeless tobacco: Never Used  . Alcohol use 3.0 oz/week    5 Glasses of wine per week  . Drug use: No     Comment: marijuana -- very little yrs ago  . Sexual activity: Yes    Partners: Male    Birth control/ protection: Surgical, Post-menopausal     Comment: tubal   Other Topics Concern  . Not on file   Social History Narrative   Household-- pt and husband (Mr [place])    Family History  Problem Relation Age of Onset  . Heart attack Father 79    second at age 65  . Lymphoma Mother     Lymphoma  . Heart attack Paternal Grandmother   . Stroke Paternal Grandmother   . Heart attack Paternal Grandfather   . Stroke Paternal Grandfather   . Heart disease Brother   . Colon cancer Neg Hx   . Breast cancer Neg Hx   . Diabetes Neg Hx     BP 134/82   Pulse 60   Ht 5\' 4"  (1.626 m)  Wt 160 lb (72.6 kg)   BMI 27.46 kg/m   Review of Systems: See HPI above.    Objective:  Physical Exam:  Gen: NAD, comfortable in exam room  Back/Right hip: No gross deformity, scoliosis. No TTP currently.  No midline or bony TTP. FROM. Strength LEs 5/5 all muscle groups.   2+ MSRs in patellar and achilles tendons, equal bilaterally. Negative SLRs. Sensation intact to light touch bilaterally. Negative logroll bilateral hips Negative fabers and piriformis stretches.    Assessment & Plan:  1. Low back pain with radiation into right leg - consistent with intermittent lumbar radiculopathy.  Shown home exercises to do daily.  Tylenol, heat.  Consider prednisone, PT, MRI if not improving.  F/u in 5-6 weeks.

## 2016-03-27 NOTE — Assessment & Plan Note (Signed)
consistent with intermittent lumbar radiculopathy.  Shown home exercises to do daily.  Tylenol, heat.  Consider prednisone, PT, MRI if not improving.  F/u in 5-6 weeks.

## 2016-04-02 ENCOUNTER — Ambulatory Visit (INDEPENDENT_AMBULATORY_CARE_PROVIDER_SITE_OTHER): Payer: Commercial Managed Care - HMO | Admitting: Internal Medicine

## 2016-04-02 ENCOUNTER — Encounter: Payer: Self-pay | Admitting: Internal Medicine

## 2016-04-02 VITALS — BP 130/68 | HR 78 | Temp 97.5°F | Resp 12 | Ht 64.0 in | Wt 162.1 lb

## 2016-04-02 DIAGNOSIS — Z23 Encounter for immunization: Secondary | ICD-10-CM | POA: Diagnosis not present

## 2016-04-02 DIAGNOSIS — I10 Essential (primary) hypertension: Secondary | ICD-10-CM | POA: Diagnosis not present

## 2016-04-02 DIAGNOSIS — Z0000 Encounter for general adult medical examination without abnormal findings: Secondary | ICD-10-CM | POA: Insufficient documentation

## 2016-04-02 DIAGNOSIS — E785 Hyperlipidemia, unspecified: Secondary | ICD-10-CM

## 2016-04-02 NOTE — Assessment & Plan Note (Signed)
We are not doing a physical exam today but I gather the following information: Td 2008; pnm-23 2015; prevnar 03-2016;  zostavax 2011 female care: saw Dr Jalene Mullet 10-2015 [facility] 10-2015  CCS: normal cscope 2015

## 2016-04-02 NOTE — Patient Instructions (Signed)
GO TO THE FRONT DESK Schedule your next appointment for a  Check up in 6 months, no fasting  Schedule labs to be done this week, fasting

## 2016-04-02 NOTE — Assessment & Plan Note (Signed)
HTN: Controlled, taking Diovan 160 mg half tablet daily. No ambulatory BPs, BP today very good High cholesterol: Crestor dose increase base on last LDL, check a FLP, AST, ALT. MSK: Having back pain with intermittent radiculopathy, saw sports medicine, doing PT, improving. Primary care: Chart reviewed se comments below RTC 6 months

## 2016-04-02 NOTE — Progress Notes (Signed)
Pre visit review using our clinic review tool, if applicable. No additional management support is needed unless otherwise documented below in the visit note. 

## 2016-04-02 NOTE — Progress Notes (Signed)
Subjective:    Patient ID: Kara Wheeler, female    DOB: Aug 22, 1946, 70 y.o.   MRN: [id number]  DOS:  04/02/2016 Type of visit - description : rov Interval history:  Was recently seen with back pain, saw sports meds, she had intermittent radiculopathy, improving with conservative treatment. HTN: Good compliance of medication, no ambulatory BPs High cholesterol: Good med compliance, due for labs  Review of Systems  No chest pain or difficulty breathing No nausea, vomiting, diarrhea. No myalgias except for back pain  Past Medical History:  Diagnosis Date  . Ankle fracture, right 2013  . Coronary atherosclerosis of native coronary artery 2005   Taxus drug-eluting stent to treat severe stenosis in the proximal LAD  . HTN (hypertension)   . Mixed dyslipidemia   . Osteoarthritis     Past Surgical History:  Procedure Laterality Date  . CERVICAL CONE BIOPSY  1978   benign  . CORONARY STENT PLACEMENT  11/2003   LAD  . TUBAL LIGATION Bilateral 1980    Social History   Social History  . Marital status: Married    Spouse name: N/A  . Number of children: 1  . Years of education: N/A   Occupational History  . 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 Smoking status: Former Smoker    Packs/day: 0.25    Years: 4.00    Types: Cigarettes    Start date: 08/26/1962    Quit date: 08/26/1966  . Smokeless tobacco: Never Used  . Alcohol use 3.0 oz/week    5 Glasses of wine per week  . Drug use: No     Comment: marijuana -- very little yrs ago  . Sexual activity: Yes    Partners: Male    Birth control/ protection: Surgical, Post-menopausal     Comment: tubal   Other Topics Concern  . Not on file   Social History Narrative   Household-- pt and husband (Mr Ericka [place])        Medication List       Accurate as of 04/02/16 12:30 PM. Always use your most recent med list.          aspirin 81 MG tablet Take 81 mg by mouth daily.     CALCIUM 600 + D PO Take 1 tablet by mouth daily.   fish oil-omega-3 fatty acids 1000 MG capsule Take 2 g by mouth daily.   multivitamin per tablet Take 1 tablet by mouth daily.   rosuvastatin 20 MG tablet Commonly known as:  CRESTOR Take 1 tablet (20 mg total) by mouth daily.   valsartan 160 MG tablet Commonly known as:  DIOVAN Take 160 mg by mouth daily.   valsartan 160 MG tablet Commonly known as:  DIOVAN Take 1 tablet (160 mg total) by mouth daily.   VITAMIN D-3 PO Take 500 Units by mouth daily.          Objective:   Physical Exam BP 130/68 (BP Location: Left Arm, Patient Position: Sitting, Cuff Size: Small)   Pulse 78   Temp 97.5 F (36.4 C) (Oral)   Resp 12   Ht 5\' 4"  (1.626 m)   Wt 162 lb 2 oz (73.5 kg)   SpO2 97%   BMI 27.83 kg/m  General:   Well developed, well nourished . NAD.  HEENT:  Normocephalic . Face symmetric, atraumatic Lungs:  CTA B Normal respiratory effort, no intercostal retractions, no accessory muscle use. Heart:  RRR,  no murmur.  No pretibial edema bilaterally  Skin: Not pale. Not jaundice Neurologic:  alert & oriented X3.  Speech normal, gait appropriate for age and unassisted Psych--  Cognition and judgment appear intact.  Cooperative with normal attention span and concentration.  Behavior appropriate. No anxious or depressed appearing.      Assessment & Plan:    Assessment New pt 09-2015 HTN Dyslipidemia Elevated LFTs per chart review: AST slightly high, normal ALT CV: CAD dx 2005, was asx >> stent  H/o false  + negative EKG Myoview 2013 negative.  stress echo normal 11-2014 H/o abnormal PAP, cone bx (remotely) H/o Fx distal fibula -R- 2013, no surgery  PLAN HTN: Controlled, taking Diovan 160 mg half tablet daily. No ambulatory BPs, BP today very good High cholesterol: Crestor dose increase base on last LDL, check a FLP, AST, ALT. MSK: Having back pain with intermittent radiculopathy, saw sports medicine, doing  PT, improving. Primary care: Chart reviewed se comments below RTC 6 months

## 2016-04-08 ENCOUNTER — Other Ambulatory Visit (INDEPENDENT_AMBULATORY_CARE_PROVIDER_SITE_OTHER): Payer: Commercial Managed Care - HMO

## 2016-04-08 DIAGNOSIS — E785 Hyperlipidemia, unspecified: Secondary | ICD-10-CM | POA: Diagnosis not present

## 2016-04-08 LAB — ALT: ALT: 26 U/L (ref 0–35)

## 2016-04-08 LAB — LIPID PANEL
Cholesterol: 170 mg/dL (ref 0–200)
HDL: 66.2 mg/dL (ref >39.00)
LDL Cholesterol: 82 mg/dL (ref 0–99)
NONHDL: 103.78
TRIGLYCERIDES: 108 mg/dL (ref 0.0–149.0)
Total CHOL/HDL Ratio: 3
VLDL: 21.6 mg/dL (ref 0.0–40.0)

## 2016-04-08 LAB — AST: AST: 38 U/L — AB (ref 0–37)

## 2016-04-15 ENCOUNTER — Other Ambulatory Visit: Payer: Self-pay

## 2016-04-15 MED ORDER — ROSUVASTATIN CALCIUM 20 MG PO TABS: 20.0000 mg | ORAL_TABLET | Freq: Every day | ORAL | 1 refills | Status: DC

## 2016-04-23 ENCOUNTER — Encounter: Payer: Self-pay | Admitting: Cardiovascular Disease

## 2016-05-07 ENCOUNTER — Encounter: Payer: Self-pay | Admitting: Family Medicine

## 2016-05-07 ENCOUNTER — Ambulatory Visit (INDEPENDENT_AMBULATORY_CARE_PROVIDER_SITE_OTHER): Payer: Commercial Managed Care - HMO | Admitting: Family Medicine

## 2016-05-07 DIAGNOSIS — M545 Low back pain, unspecified: Secondary | ICD-10-CM

## 2016-05-07 DIAGNOSIS — M79604 Pain in right leg: Secondary | ICD-10-CM

## 2016-05-07 NOTE — Patient Instructions (Signed)
Your pain is due to an irritated nerve in your low back from arthritis, disc bulging OR piriformis syndrome. Both are treated similarly. Continue the home exercises. Consider prednisone, physical therapy specifically for your back, MRI (call us if you want to try any of these). Follow up with me as needed otherwise.

## 2016-05-09 ENCOUNTER — Encounter: Payer: Self-pay | Admitting: Cardiovascular Disease

## 2016-05-09 ENCOUNTER — Ambulatory Visit (INDEPENDENT_AMBULATORY_CARE_PROVIDER_SITE_OTHER): Payer: Commercial Managed Care - HMO | Admitting: Cardiovascular Disease

## 2016-05-09 VITALS — BP 130/80 | HR 63 | Ht 64.0 in | Wt 162.4 lb

## 2016-05-09 DIAGNOSIS — I2510 Atherosclerotic heart disease of native coronary artery without angina pectoris: Secondary | ICD-10-CM

## 2016-05-09 NOTE — Progress Notes (Signed)
Cardiology Office Note Date:  05/09/2016   ID:  Kara Wheeler, DOB Wheeler 17, 1947, MRN [id number]  PCP:  Kara November, MD  Cardiologist:  Sherren Mocha, MD    Chief Complaint  Patient presents with  . Office Visit    no new sx   History of Present Illness: Kara Wheeler is a 70 y.o. female who presents for followup of coronary artery disease. She underwent stenting of the LAD in 2005. A drug-eluting stent was utilized. She was asymptomatic at presentation but she had an abnormal stress test. This led to cardiac catheterization and subsequent stenting.  The patient has done well from a cardiac perspective. She is active without exertional symptoms.  Today, she denies symptoms of palpitations, chest pain, shortness of breath, orthopnea, PND, lower extremity edema, dizziness, or syncope.  Past Medical History:  Diagnosis Date  . Ankle fracture, right 2013  . Coronary atherosclerosis of native coronary artery 2005   Taxus drug-eluting stent to treat severe stenosis in the proximal LAD  . HTN (hypertension)   . Mixed dyslipidemia   . Osteoarthritis     Past Surgical History:  Procedure Laterality Date  . CERVICAL CONE BIOPSY  1978   benign  . CORONARY STENT PLACEMENT  11/2003   LAD  . TUBAL LIGATION Bilateral 1980    Current Outpatient Prescriptions  Medication Sig Dispense Refill  . aspirin 81 MG tablet Take 81 mg by mouth daily.      . Calcium Carbonate-Vitamin D (CALCIUM 600 + D PO) Take 1 tablet by mouth daily.      . Cholecalciferol (VITAMIN D-3 PO) Take 500 Units by mouth daily.     . fish oil-omega-3 fatty acids 1000 MG capsule Take 2 g by mouth daily.      . multivitamin (THERAGRAN) per tablet Take 1 tablet by mouth daily.      . rosuvastatin (CRESTOR) 20 MG tablet Take 1 tablet (20 mg total) by mouth daily. 90 tablet 1  . valsartan (DIOVAN) 160 MG tablet Take 80 mg by mouth daily.     No current facility-administered medications for this visit.      Allergies:   Review of patient's allergies indicates no known allergies.   Social History:  The patient  reports that she quit smoking about 49 years ago. Her smoking use included Cigarettes. She started smoking about 53 years ago. She has a 1.00 pack-year smoking history. She has never used smokeless tobacco. She reports that she drinks about 3.0 oz of alcohol per week . She reports that she does not use drugs.   Family History:  The patient's  family history includes Heart attack in her paternal grandfather and paternal grandmother; Heart attack (age of onset: 23) in her father; Heart disease in her brother; Lymphoma in her mother; Stroke in her paternal grandfather and paternal grandmother.    ROS:  Please see the history of present illness.  Otherwise, review of systems is positive for back/hip pain.  All other systems are reviewed and negative.    PHYSICAL EXAM: VS:  BP 130/80   Pulse 63   Ht 5\' 4"  (1.626 m)   Wt 73.7 kg (162 lb 6.4 oz)   SpO2 94%   BMI 27.88 kg/m  , BMI Body mass index is 27.88 kg/m. GEN: Well nourished, well developed, in no acute distress  HEENT: normal  Neck: no JVD, no masses. No carotid bruits Cardiac: RRR without murmur or gallop  Respiratory:  clear to auscultation bilaterally, normal work of breathing GI: soft, nontender, nondistended, + BS MS: no deformity or atrophy  Ext: no pretibial edema, pedal pulses 2+= bilaterally Skin: warm and dry, no rash Neuro:  Strength and sensation are intact Psych: euthymic mood, full affect  EKG:  EKG is ordered today. The ekg ordered today shows sinus bradycardia 57 bpm, otherwise normal  Recent Labs: 10/05/2015: BUN 17; Creatinine, Ser 0.96; Hemoglobin 13.8; Platelets 165.0; Potassium 4.5; Sodium 139 04/08/2016: ALT 26   Lipid Panel     Component Value Date/Time   CHOL 170 04/08/2016 0714   TRIG 108.0 04/08/2016 0714   HDL 66.20 04/08/2016 0714   CHOLHDL 3 04/08/2016 0714   VLDL 21.6  04/08/2016 0714   LDLCALC 82 04/08/2016 0714      Wt Readings from Last 3 Encounters:  05/09/16 73.7 kg (162 lb 6.4 oz)  05/07/16 72.6 kg (160 lb)  04/02/16 73.5 kg (162 lb 2 oz)     Cardiac Studies Reviewed: Cardiac Cath 12-20-2003: Cardiac catheterization 12/20/2003: FINAL DIAGNOSES: 1. Single-vessel coronary artery disease with 80% mid left anterior  descending lesion. 2. Minor plaque in the right coronary artery with induced spasm of the  ostium relieved by intracoronary nitroglycerin. 3. Normal left ventricular function. Ejection fraction 65%. 4. Normal abdominal aorta, iliac arteries and renal arteries. 5. Successful primary stenting with drug-eluting stent in the mid left  anterior descending lesion. 6. Successful Angio-Seal of the right femoral artery.  DISPOSITION: Will monitor on the EAD overnight and anticipate discharge home in the a.m. Will start Plavix and aspirin.  Myoview 09/09/2011: Impression Exercise Capacity: Good exercise capacity. BP Response: Normal blood pressure response. Clinical Symptoms: No chest pain. ECG Impression: Electrically positive though specificity limited. Comparison with Prior Nuclear Study: No prior perfusion study.  11-29-2014: Study Conclusions  - Stress ECG conclusions: There were no stress arrhythmias or conduction abnormalities. The stress ECG was consistent with myocardial ischemia. [facility] scoring: exercise time of 10 min; maximum ST deviation of 1.5 mm; no angina; resulting score is 3. This score predicts a moderate risk of cardiac events. - Staged echo: Normal echo stress  Impressions:  - Normal stress echo images with no evidence of wall motion abnormalities with exercise. Positive EKG for ischemia with 1.5 mm of horizontal ST segment depression in the inferolateral leads at peak exercise. In recovery J point depression normalized with residual downsloping ST segment depression.  This could represent a false positive EKG. There were no symptoms of chest pain or SOB.  ASSESSMENT AND PLAN: 1.  CAD, native vessel, without angina: pt continues to do well and maintains an active lifestyle. Current medications reviewed and no changes are recommended today. Stress echo from 2016 is reviewed.  2. HTN: BP controlled on valsartan.  3. Hyperlipidemia: treated with crestor. Labs from August reviewed - LDL 82 mg/dl.  Current medicines are reviewed with the patient today.  The patient does not have concerns regarding medicines.  Labs/ tests ordered today include:  No orders of the defined types were placed in this encounter.  Disposition:   FU one year  Signed, Sherren Mocha, MD  05/09/2016 4:28 PM    [address], [place], [place]  [address] Phone: [phone]; Fax: [phone]

## 2016-05-09 NOTE — Patient Instructions (Signed)

## 2016-05-09 NOTE — Assessment & Plan Note (Signed)
consistent with intermittent lumbar radiculopathy vs piriformis syndrome (has some tenderness here today).  Discussed options - she would like to continue with home exercises.  She will use tylenol and/or heat as needed.  Consider prednisone, PT, MRI if not improving.  F/u prn.

## 2016-05-09 NOTE — Progress Notes (Signed)
PCP: Kathlene November, MD Consultation requested by Mackie Pai PA-C Subjective:   HPI: Patient is a 70 y.o. female here for right hip, back pain.  7/28: Patient reports she's had pain from buttocks/back to back of knee since beginning of May. Worse with prolonged sitting. Also worse in the morning, better with standing and walking. Using tylenol occasionally. Sometimes has no pain - currently 0/10 level but can be sharp posteriorly. No numbness, skin changes.  9/12: Patient reports she feels about the same as last visit. Pain is 0/10 at rest, up to 5-7/10 at worst. Worst in the morning. No radiation now. No numbness or tingling. No bowel/bladder dysfunction. Not taking any medicines now.  Past Medical History:  Diagnosis Date  . Ankle fracture, right 2013  . Coronary atherosclerosis of native coronary artery 2005   Taxus drug-eluting stent to treat severe stenosis in the proximal LAD  . HTN (hypertension)   . Mixed dyslipidemia   . Osteoarthritis     Current Outpatient Prescriptions on File Prior to Visit  Medication Sig Dispense Refill  . aspirin 81 MG tablet Take 81 mg by mouth daily.      . Calcium Carbonate-Vitamin D (CALCIUM 600 + D PO) Take 1 tablet by mouth daily.      . Cholecalciferol (VITAMIN D-3 PO) Take 500 Units by mouth daily.     . fish oil-omega-3 fatty acids 1000 MG capsule Take 2 g by mouth daily.      . multivitamin (THERAGRAN) per tablet Take 1 tablet by mouth daily.      . rosuvastatin (CRESTOR) 20 MG tablet Take 1 tablet (20 mg total) by mouth daily. 90 tablet 1  . valsartan (DIOVAN) 160 MG tablet Take 1 tablet (160 mg total) by mouth daily. (Patient taking differently: Take 80 mg by mouth daily. ) 30 tablet 5  . valsartan (DIOVAN) 160 MG tablet Take 160 mg by mouth daily.     No current facility-administered medications on file prior to visit.     Past Surgical History:  Procedure Laterality Date  . CERVICAL CONE BIOPSY  1978   benign  . CORONARY  STENT PLACEMENT  11/2003   LAD  . TUBAL LIGATION Bilateral 1980    No Known Allergies  Social History   Social History  . Marital status: Married    Spouse name: N/A  . Number of children: 1  . Years of education: N/A   Occupational History  . 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 Smoking status: Former Smoker    Packs/day: 0.25    Years: 4.00    Types: Cigarettes    Start date: 08/26/1962    Quit date: 08/26/1966  . Smokeless tobacco: Never Used  . Alcohol use 3.0 oz/week    5 Glasses of wine per week  . Drug use: No     Comment: marijuana -- very little yrs ago  . Sexual activity: Yes    Partners: Male    Birth control/ protection: Surgical, Post-menopausal     Comment: tubal   Other Topics Concern  . Not on file   Social History Narrative   Household-- pt and husband (Mr [place])    Family History  Problem Relation Age of Onset  . Heart attack Father 60    second at age 43  . Lymphoma Mother     Lymphoma  . Heart disease Brother   . Heart attack Paternal Grandmother   .  Stroke Paternal Grandmother   . Heart attack Paternal Grandfather   . Stroke Paternal Grandfather   . Colon cancer Neg Hx   . Breast cancer Neg Hx   . Diabetes Neg Hx     BP 125/79   Pulse 63   Ht 5\' 4"  (1.626 m)   Wt 160 lb (72.6 kg)   BMI 27.46 kg/m   Review of Systems: See HPI above.    Objective:  Physical Exam:  Gen: NAD, comfortable in exam room  Back/Right hip: No gross deformity, scoliosis. Mild TTP buttock, over piriformis.  No midline or bony TTP. FROM. Strength LEs 5/5 all muscle groups.   2+ MSRs in patellar and achilles tendons, equal bilaterally. Negative SLRs. Sensation intact to light touch bilaterally. Negative logroll bilateral hips Negative fabers and piriformis stretches.    Assessment & Plan:  1. Low back pain with radiation into right leg - consistent with intermittent lumbar radiculopathy vs piriformis  syndrome (has some tenderness here today).  Discussed options - she would like to continue with home exercises.  She will use tylenol and/or heat as needed.  Consider prednisone, PT, MRI if not improving.  F/u prn.

## 2016-08-07 ENCOUNTER — Other Ambulatory Visit: Payer: Self-pay | Admitting: Internal Medicine

## 2016-09-30 ENCOUNTER — Other Ambulatory Visit: Payer: Self-pay | Admitting: Family Medicine

## 2016-09-30 DIAGNOSIS — Z1231 Encounter for screening mammogram for malignant neoplasm of breast: Secondary | ICD-10-CM

## 2016-10-03 ENCOUNTER — Ambulatory Visit: Payer: Self-pay | Admitting: Internal Medicine

## 2016-10-14 ENCOUNTER — Encounter: Payer: Self-pay | Admitting: Internal Medicine

## 2016-10-14 ENCOUNTER — Ambulatory Visit (INDEPENDENT_AMBULATORY_CARE_PROVIDER_SITE_OTHER): Payer: Medicare HMO | Admitting: Internal Medicine

## 2016-10-14 VITALS — BP 122/70 | HR 81 | Temp 98.1°F | Resp 14 | Ht 64.0 in | Wt 160.0 lb

## 2016-10-14 DIAGNOSIS — Z1159 Encounter for screening for other viral diseases: Secondary | ICD-10-CM | POA: Diagnosis not present

## 2016-10-14 DIAGNOSIS — E785 Hyperlipidemia, unspecified: Secondary | ICD-10-CM | POA: Diagnosis not present

## 2016-10-14 DIAGNOSIS — I2510 Atherosclerotic heart disease of native coronary artery without angina pectoris: Secondary | ICD-10-CM | POA: Diagnosis not present

## 2016-10-14 DIAGNOSIS — I10 Essential (primary) hypertension: Secondary | ICD-10-CM

## 2016-10-14 LAB — BASIC METABOLIC PANEL
BUN: 14 mg/dL (ref 6–23)
CHLORIDE: 103 meq/L (ref 96–112)
CO2: 31 meq/L (ref 19–32)
CREATININE: 1.02 mg/dL (ref 0.40–1.20)
Calcium: 10.7 mg/dL — ABNORMAL HIGH (ref 8.4–10.5)
GFR: 57.03 mL/min — ABNORMAL LOW (ref >60.00)
Glucose, Bld: 104 mg/dL — ABNORMAL HIGH (ref 70–99)
Potassium: 4.1 mEq/L (ref 3.5–5.1)
Sodium: 140 mEq/L (ref 135–145)

## 2016-10-14 NOTE — Patient Instructions (Signed)
GO TO THE LAB : Get the blood work     GO TO THE FRONT DESK Schedule your next appointment for a  Physical exam in 6 months  You can also have a Medicare Wellness with one of our nurses

## 2016-10-14 NOTE — Progress Notes (Signed)
Pre visit review using our clinic review tool, if applicable. No additional management support is needed unless otherwise documented below in the visit note. 

## 2016-10-14 NOTE — Progress Notes (Signed)
Subjective:    Patient ID: Kara Wheeler, female    DOB: 1946-05-24, 71 y.o.   MRN: [id number]  DOS:  10/14/2016 Type of visit - description : rov Interval history: In general feeling well, good compliance of medication. Note from cardiology reviewed. Still has right sided low back pain, it is better, less intense, usually only after prolonged periods of resting, decrease once she starts moving around. No further radiation to the leg.   Review of Systems Denies chest pain difficulty breathing No fever or chills   Past Medical History:  Diagnosis Date  . Ankle fracture, right 2013  . Coronary atherosclerosis of native coronary artery 2005   Taxus drug-eluting stent to treat severe stenosis in the proximal LAD  . HTN (hypertension)   . Mixed dyslipidemia   . Osteoarthritis     Past Surgical History:  Procedure Laterality Date  . CERVICAL CONE BIOPSY  1978   benign  . CORONARY STENT PLACEMENT  11/2003   LAD  . TUBAL LIGATION Bilateral 1980    Social History   Social History  . Marital status: Married    Spouse name: N/A  . Number of children: 1  . Years of education: N/A   Occupational History  . 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 Smoking status: Former Smoker    Packs/day: 0.25    Years: 4.00    Types: Cigarettes    Start date: 08/26/1962    Quit date: 08/26/1966  . Smokeless tobacco: Never Used  . Alcohol use 3.0 oz/week    5 Glasses of wine per week  . Drug use: No     Comment: marijuana -- very little yrs ago  . Sexual activity: Yes    Partners: Male    Birth control/ protection: Surgical, Post-menopausal     Comment: tubal   Other Topics Concern  . Not on file   Social History Narrative   Household-- pt and husband (Mr Ericka Pontiff)      Allergies as of 10/14/2016   No Known Allergies     Medication List       Accurate as of 10/14/16 11:59 PM. Always use your most recent med list.          aspirin  81 MG tablet Take 81 mg by mouth daily.   CALCIUM 600 + D PO Take 1 tablet by mouth daily.   fish oil-omega-3 fatty acids 1000 MG capsule Take 2 g by mouth daily.   multivitamin per tablet Take 1 tablet by mouth daily.   rosuvastatin 20 MG tablet Commonly known as:  CRESTOR Take 1 tablet (20 mg total) by mouth daily.   valsartan 160 MG tablet Commonly known as:  DIOVAN Take 1 tablet (160 mg total) by mouth daily.   VITAMIN D-3 PO Take 500 Units by mouth daily.          Objective:   Physical Exam BP 122/70 (BP Location: Left Arm, Patient Position: Sitting, Cuff Size: Normal)   Pulse 81   Temp 98.1 F (36.7 C) (Oral)   Resp 14   Ht 5\' 4"  (1.626 m)   Wt 160 lb (72.6 kg)   SpO2 96%   BMI 27.46 kg/m  General:   Well developed, well nourished . NAD.  HEENT:  Normocephalic . Face symmetric, atraumatic Lungs:  CTA B Normal respiratory effort, no intercostal retractions, no accessory muscle use. MSK: No TTP at the thoracolumbar spine.  Heart: RRR,  no murmur.  No pretibial edema bilaterally  Skin: Not pale. Not jaundice Neurologic:  alert & oriented X3.  Speech normal, gait appropriate for age and unassisted. No antalgic gait and posture Psych--  Cognition and judgment appear intact.  Cooperative with normal attention span and concentration.  Behavior appropriate. No anxious or depressed appearing.      Assessment & Plan:   Assessment New pt 09-2015 HTN Dyslipidemia Elevated LFTs per chart review: AST slightly high, normal ALT CV: Dr Burt Knack  CAD dx 2005, was asx >> stent  H/o false  + negative EKG Myoview 2013 negative.  stress echo normal 11-2014 H/o abnormal PAP, cone bx (remotely) H/o Fx distal fibula -R- 2013, no surgery  PLAN HTN: Continue Diovan, check a BMP CAD: Asx, saw cardiology, felt to be stable. Hyperlipidemia: Last LDL 82, close to her ideal off 70, continue Crestor, recheck on RTC Low back pain: X-rays were negative 02-2016. Currently  with no radicular sxs and improving. Recommend to continue staying active, stretching twice a day, judicious use of Tylenol/ibuprofen. RTC 6 months, fasting, CPX

## 2016-10-15 LAB — HEPATITIS C ANTIBODY: HCV AB: NEGATIVE

## 2016-10-15 NOTE — Assessment & Plan Note (Signed)
HTN: Continue Diovan, check a BMP CAD: Asx, saw cardiology, felt to be stable. Hyperlipidemia: Last LDL 82, close to her ideal off 70, continue Crestor, recheck on RTC Low back pain: X-rays were negative 02-2016. Currently with no radicular sxs and improving. Recommend to continue staying active, stretching twice a day, judicious use of Tylenol/ibuprofen. RTC 6 months, fasting, CPX

## 2016-11-07 ENCOUNTER — Ambulatory Visit (HOSPITAL_BASED_OUTPATIENT_CLINIC_OR_DEPARTMENT_OTHER)
Admission: RE | Admit: 2016-11-07 | Discharge: 2016-11-07 | Disposition: A | Discharge status: Home / Self Care | Payer: Medicare HMO | Source: Ambulatory Visit | Attending: Family Medicine | Admitting: Family Medicine

## 2016-11-07 ENCOUNTER — Encounter (HOSPITAL_BASED_OUTPATIENT_CLINIC_OR_DEPARTMENT_OTHER): Payer: Self-pay

## 2016-11-07 DIAGNOSIS — Z1231 Encounter for screening mammogram for malignant neoplasm of breast: Secondary | ICD-10-CM | POA: Diagnosis not present

## 2016-12-12 ENCOUNTER — Encounter: Payer: Self-pay | Admitting: Family Medicine

## 2016-12-12 ENCOUNTER — Ambulatory Visit (INDEPENDENT_AMBULATORY_CARE_PROVIDER_SITE_OTHER): Payer: Medicare HMO | Admitting: Family Medicine

## 2016-12-12 DIAGNOSIS — M545 Low back pain, unspecified: Secondary | ICD-10-CM

## 2016-12-12 NOTE — Patient Instructions (Signed)
Your pain is due to SI joint dysfunction. The stretches are very important - pick 2-3 of these, hold for 20-30 seconds, repeat twice a day (especially the one I showed you). Tylenol 500mg  1-2 tabs three times a day as needed for pain. Consider physical therapy, chiropractic care ([facility] [phone]) can be very helpful for this. Cortisone injection, iontophoresis in physical therapy are options if not improving as expected. Follow up with me in 6 weeks for reevaluation.

## 2016-12-13 NOTE — Assessment & Plan Note (Signed)
consistent with SI joint dysfunction.  Shown home stretches to do daily.  Tylenol if needed.  Consider PT, chiropractic care.  F/u in 6 weeks.

## 2016-12-13 NOTE — Progress Notes (Signed)
PCP: Kathlene November, MD Consultation requested by Mackie Pai PA-C Subjective:   HPI: Patient is a 71 y.o. female here for back pain.  7/28: Patient reports she's had pain from buttocks/back to back of knee since beginning of May. Worse with prolonged sitting. Also worse in the morning, better with standing and walking. Using tylenol occasionally. Sometimes has no pain - currently 0/10 level but can be sharp posteriorly. No numbness, skin changes.  05/07/16: Patient reports she feels about the same as last visit. Pain is 0/10 at rest, up to 5-7/10 at worst. Worst in the morning. No radiation now. No numbness or tingling. No bowel/bladder dysfunction. Not taking any medicines now.  12/12/16: Patient reports her low back has worsened again past few weeks but now more on the left side. Pain level to 3-4/10 and sharp. Worse with standing to sitting. Some pain with ballroom dancing. Worse getting out of bed also. No bowel/bladder dysfunction. No numbness or tingling.  Past Medical History:  Diagnosis Date  . Ankle fracture, right 2013  . Coronary atherosclerosis of native coronary artery 2005   Taxus drug-eluting stent to treat severe stenosis in the proximal LAD  . HTN (hypertension)   . Mixed dyslipidemia   . Osteoarthritis     Current Outpatient Prescriptions on File Prior to Visit  Medication Sig Dispense Refill  . aspirin 81 MG tablet Take 81 mg by mouth daily.      . Calcium Carbonate-Vitamin D (CALCIUM 600 + D PO) Take 1 tablet by mouth daily.      . Cholecalciferol (VITAMIN D-3 PO) Take 500 Units by mouth daily.     . fish oil-omega-3 fatty acids 1000 MG capsule Take 2 g by mouth daily.      . multivitamin (THERAGRAN) per tablet Take 1 tablet by mouth daily.      . rosuvastatin (CRESTOR) 20 MG tablet Take 1 tablet (20 mg total) by mouth daily. 90 tablet 1  . valsartan (DIOVAN) 160 MG tablet Take 1 tablet (160 mg total) by mouth daily. 30 tablet 1   No current  facility-administered medications on file prior to visit.     Past Surgical History:  Procedure Laterality Date  . CERVICAL CONE BIOPSY  1978   benign  . CORONARY STENT PLACEMENT  11/2003   LAD  . TUBAL LIGATION Bilateral 1980    No Known Allergies  Social History   Social History  . Marital status: Married    Spouse name: N/A  . Number of children: 1  . Years of education: N/A   Occupational History  . 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 Smoking status: Former Smoker    Packs/day: 0.25    Years: 4.00    Types: Cigarettes    Start date: 08/26/1962    Quit date: 08/26/1966  . Smokeless tobacco: Never Used  . Alcohol use 3.0 oz/week    5 Glasses of wine per week  . Drug use: No     Comment: marijuana -- very little yrs ago  . Sexual activity: Yes    Partners: Male    Birth control/ protection: Surgical, Post-menopausal     Comment: tubal   Other Topics Concern  . Not on file   Social History Narrative   Household-- pt and husband (Mr [place])    Family History  Problem Relation Age of Onset  . Heart attack Father 46    second at age 110  .  Lymphoma Mother     Lymphoma  . Heart disease Brother   . Heart attack Paternal Grandmother   . Stroke Paternal Grandmother   . Heart attack Paternal Grandfather   . Stroke Paternal Grandfather   . Colon cancer Neg Hx   . Breast cancer Neg Hx   . Diabetes Neg Hx     BP 113/75   Pulse 73   Ht 5\' 4"  (1.626 m)   Wt 160 lb (72.6 kg)   BMI 27.46 kg/m   Review of Systems: See HPI above.    Objective:  Physical Exam:  Gen: NAD, comfortable in exam room  Back/Left hip: No gross deformity, scoliosis. TTP left SI joint.  No other tenderness.  No midline or bony TTP. FROM with pain left SI joint area with flexion. Strength LEs 5/5 all muscle groups. 2+ MSRs in patellar and achilles tendons, equal bilaterally. Negative SLRs. Sensation intact to light touch  bilaterally. Negative logroll bilateral hips Positive fabers on left, negative right.  Negative piriformis stretches.    Assessment & Plan:  1. Low back pain - consistent with SI joint dysfunction.  Shown home stretches to do daily.  Tylenol if needed.  Consider PT, chiropractic care.  F/u in 6 weeks.

## 2016-12-16 ENCOUNTER — Other Ambulatory Visit: Payer: Self-pay

## 2016-12-16 MED ORDER — VALSARTAN 160 MG PO TABS: 160.0000 mg | ORAL_TABLET | Freq: Every day | ORAL | 2 refills | Status: DC

## 2017-01-07 ENCOUNTER — Other Ambulatory Visit: Payer: Self-pay | Admitting: Internal Medicine

## 2017-01-22 ENCOUNTER — Ambulatory Visit (INDEPENDENT_AMBULATORY_CARE_PROVIDER_SITE_OTHER): Payer: Medicare HMO | Admitting: Internal Medicine

## 2017-01-22 ENCOUNTER — Encounter: Payer: Self-pay | Admitting: Internal Medicine

## 2017-01-22 VITALS — BP 118/78 | HR 95 | Temp 97.9°F | Resp 14 | Ht 64.0 in | Wt 151.4 lb

## 2017-01-22 DIAGNOSIS — J069 Acute upper respiratory infection, unspecified: Secondary | ICD-10-CM | POA: Diagnosis not present

## 2017-01-22 MED ORDER — HYDROCODONE-HOMATROPINE 5-1.5 MG/5ML PO SYRP
5.0000 mL | ORAL_SOLUTION | Freq: Every evening | ORAL | 0 refills | Status: DC | PRN
Start: 2017-01-22 — End: 2017-01-28

## 2017-01-22 NOTE — Patient Instructions (Signed)
Rest, fluids , tylenol  For cough:  Take over-the-counter medication as needed. Avoid OTCs that have decongestants such as  Pseudoephedrine or phenylephrine  If the cough is severe or persistent at night, take hydrocodone. Will cause drowsiness.   If you develop nasal congestion: Use OTC Nasocort or Flonase : 2 nasal sprays on each side of the nose in the morning until you feel better   Call if not gradually better over the next  10 days  Call anytime if the symptoms are severe

## 2017-01-22 NOTE — Progress Notes (Signed)
Subjective:    Patient ID: Kara Wheeler, female    DOB: 1945-12-01, 71 y.o.   MRN: [id number]  DOS:  01/22/2017 Type of visit - description : Acute Interval history: Symptoms started 5 days ago with cough, gradually getting worse, took DayQuil and NyQuil with mixed results. Last night, the cough was not better with OTCs and she couldn't sleep. Other than the cough  she reports no other symptoms although her husband mentioned that her chest was mildly congested. She has no history of asthma or tobacco abuse.  Review of Systems  no fever chills No sinus pain congestion or nasal discharge No wheezing per se or difficulty breathing Minimal amount of whitish sputum in the mornings  Past Medical History:  Diagnosis Date  . Ankle fracture, right 2013  . Coronary atherosclerosis of native coronary artery 2005   Taxus drug-eluting stent to treat severe stenosis in the proximal LAD  . HTN (hypertension)   . Mixed dyslipidemia   . Osteoarthritis     Past Surgical History:  Procedure Laterality Date  . CERVICAL CONE BIOPSY  1978   benign  . CORONARY STENT PLACEMENT  11/2003   LAD  . TUBAL LIGATION Bilateral 1980    Social History   Social History  . Marital status: Married    Spouse name: N/A  . Number of children: 1  . Years of education: N/A   Occupational History  . 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 Smoking status: Former Smoker    Packs/day: 0.25    Years: 4.00    Types: Cigarettes    Start date: 08/26/1962    Quit date: 08/26/1966  . Smokeless tobacco: Never Used  . Alcohol use 3.0 oz/week    5 Glasses of wine per week  . Drug use: No     Comment: marijuana -- very little yrs ago  . Sexual activity: Yes    Partners: Male    Birth control/ protection: Surgical, Post-menopausal     Comment: tubal   Other Topics Concern  . Not on file   Social History Narrative   Household-- pt and husband (Mr Ericka Pontiff)       Allergies as of 01/22/2017   No Known Allergies     Medication List       Accurate as of 01/22/17 11:59 PM. Always use your most recent med list.          aspirin 81 MG tablet Take 81 mg by mouth daily.   CALCIUM 600 + D PO Take 1 tablet by mouth daily.   fish oil-omega-3 fatty acids 1000 MG capsule Take 2 g by mouth daily.   HYDROcodone-homatropine 5-1.5 MG/5ML syrup Commonly known as:  HYCODAN Take 5 mLs by mouth at bedtime as needed for cough.   multivitamin per tablet Take 1 tablet by mouth daily.   rosuvastatin 20 MG tablet Commonly known as:  CRESTOR TAKE 1 TABLET (20 MG TOTAL) BY MOUTH DAILY.   valsartan 160 MG tablet Commonly known as:  DIOVAN Take 1 tablet (160 mg total) by mouth daily.   VITAMIN D-3 PO Take 500 Units by mouth daily.          Objective:   Physical Exam BP 118/78 (BP Location: Left Arm, Patient Position: Sitting, Cuff Size: Small)   Pulse 95   Temp 97.9 F (36.6 C) (Oral)   Resp 14   Ht 5\' 4"  (1.626 m)  Wt 151 lb 6 oz (68.7 kg)   SpO2 95%   BMI 25.98 kg/m  General:   Well developed, well nourished . NAD.  HEENT:  Normocephalic . Face symmetric, atraumatic. TMs obscured by wax but no discharge. Throat symmetric, no red. Nose not congested Lungs:  CTA B Normal respiratory effort, no intercostal retractions, no accessory muscle use. Heart: RRR,  no murmur.  No pretibial edema bilaterally  Skin: Not pale. Not jaundice Neurologic:  alert & oriented X3.  Speech normal, gait appropriate for age and unassisted Psych--  Cognition and judgment appear intact.  Cooperative with normal attention span and concentration.  Behavior appropriate. No anxious or depressed appearing.      Assessment & Plan:   Assessment New pt 09-2015 HTN Dyslipidemia Elevated LFTs per chart review: AST slightly high, normal ALT CV: Dr Burt Knack  CAD dx 2005, was asx >> stent  H/o false  + negative EKG Myoview 2013 negative.  stress echo normal  11-2014 H/o abnormal PAP, cone bx (remotely) H/o Fx distal fibula -R- 2013, no surgery  PLAN URI: Likely has a URI with predominant cough, continue OTCs but will also prescribed hydrocodone to be used only at night if she can't sleep due to cough. Call if no better. See AVS.

## 2017-01-22 NOTE — Progress Notes (Signed)
Pre visit review using our clinic review tool, if applicable. No additional management support is needed unless otherwise documented below in the visit note. 

## 2017-01-23 ENCOUNTER — Encounter: Payer: Self-pay | Admitting: Family Medicine

## 2017-01-23 ENCOUNTER — Ambulatory Visit (INDEPENDENT_AMBULATORY_CARE_PROVIDER_SITE_OTHER): Payer: Medicare HMO | Admitting: Family Medicine

## 2017-01-23 DIAGNOSIS — M545 Low back pain, unspecified: Secondary | ICD-10-CM

## 2017-01-23 NOTE — Assessment & Plan Note (Signed)
2/2 SI joint dysfunction.  Resolved.  Encouraged home exercises/stretches for 6 weeks about 3 days a week.  F/u prn.

## 2017-01-23 NOTE — Progress Notes (Signed)
PCP: Colon Branch, MD Consultation requested by Mackie Pai PA-C Subjective:   HPI: Patient is a 71 y.o. female here for back pain.  7/28: Patient reports she's had pain from buttocks/back to back of knee since beginning of May. Worse with prolonged sitting. Also worse in the morning, better with standing and walking. Using tylenol occasionally. Sometimes has no pain - currently 0/10 level but can be sharp posteriorly. No numbness, skin changes.  05/07/16: Patient reports she feels about the same as last visit. Pain is 0/10 at rest, up to 5-7/10 at worst. Worst in the morning. No radiation now. No numbness or tingling. No bowel/bladder dysfunction. Not taking any medicines now.  12/12/16: Patient reports her low back has worsened again past few weeks but now more on the left side. Pain level to 3-4/10 and sharp. Worse with standing to sitting. Some pain with ballroom dancing. Worse getting out of bed also. No bowel/bladder dysfunction. No numbness or tingling.  5/31: Patient reports she's doing well. Pain level 0/10. Did home exercise program initially and now no problems. No radiation into legs. No bowel/bladder dysfunction. No numbness or tingling.  Past Medical History:  Diagnosis Date  . Ankle fracture, right 2013  . Coronary atherosclerosis of native coronary artery 2005   Taxus drug-eluting stent to treat severe stenosis in the proximal LAD  . HTN (hypertension)   . Mixed dyslipidemia   . Osteoarthritis     Current Outpatient Prescriptions on File Prior to Visit  Medication Sig Dispense Refill  . aspirin 81 MG tablet Take 81 mg by mouth daily.      . Calcium Carbonate-Vitamin D (CALCIUM 600 + D PO) Take 1 tablet by mouth daily.      . Cholecalciferol (VITAMIN D-3 PO) Take 500 Units by mouth daily.     . fish oil-omega-3 fatty acids 1000 MG capsule Take 2 g by mouth daily.      Marland Kitchen HYDROcodone-homatropine (HYCODAN) 5-1.5 MG/5ML syrup Take 5 mLs by mouth at  bedtime as needed for cough. 120 mL 0  . multivitamin (THERAGRAN) per tablet Take 1 tablet by mouth daily.      . rosuvastatin (CRESTOR) 20 MG tablet TAKE 1 TABLET (20 MG TOTAL) BY MOUTH DAILY. 90 tablet 1  . valsartan (DIOVAN) 160 MG tablet Take 1 tablet (160 mg total) by mouth daily. 90 tablet 2   No current facility-administered medications on file prior to visit.     Past Surgical History:  Procedure Laterality Date  . CERVICAL CONE BIOPSY  1978   benign  . CORONARY STENT PLACEMENT  11/2003   LAD  . TUBAL LIGATION Bilateral 1980    No Known Allergies  Social History   Social History  . Marital status: Married    Spouse name: N/A  . Number of children: 1  . Years of education: N/A   Occupational History  . 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 Smoking status: Former Smoker    Packs/day: 0.25    Years: 4.00    Types: Cigarettes    Start date: 08/26/1962    Quit date: 08/26/1966  . Smokeless tobacco: Never Used  . Alcohol use 3.0 oz/week    5 Glasses of wine per week  . Drug use: No     Comment: marijuana -- very little yrs ago  . Sexual activity: Yes    Partners: Male    Birth control/ protection: Surgical, Post-menopausal  Comment: tubal   Other Topics Concern  . Not on file   Social History Narrative   Household-- pt and husband (Mr [place])    Family History  Problem Relation Age of Onset  . Heart attack Father 14       second at age 109  . Lymphoma Mother        Lymphoma  . Heart disease Brother   . Heart attack Paternal Grandmother   . Stroke Paternal Grandmother   . Heart attack Paternal Grandfather   . Stroke Paternal Grandfather   . Colon cancer Neg Hx   . Breast cancer Neg Hx   . Diabetes Neg Hx     BP 138/85   Pulse 65   Ht 5\' 4"  (1.626 m)   Wt 152 lb (68.9 kg)   BMI 26.09 kg/m   Review of Systems: See HPI above.    Objective:  Physical Exam:  Gen: NAD, comfortable in exam  room  Back/Left hip: No gross deformity, scoliosis. No TTP left SI joint.  No other tenderness.  No midline or bony TTP. FROM without pain. Strength LEs 5/5 all muscle groups. 2+ MSRs in patellar and achilles tendons, equal bilaterally. Negative SLRs. Sensation intact to light touch bilaterally. Negative logroll bilateral hips Negative fabers and piriformis stretches.    Assessment & Plan:  1. Low back pain - 2/2 SI joint dysfunction.  Resolved.  Encouraged home exercises/stretches for 6 weeks about 3 days a week.  F/u prn.

## 2017-01-23 NOTE — Assessment & Plan Note (Signed)
URI: Likely has a URI with predominant cough, continue OTCs but will also prescribed hydrocodone to be used only at night if she can't sleep due to cough. Call if no better. See AVS.

## 2017-01-23 NOTE — Patient Instructions (Signed)
I would do the home exercises/stretches 3 times a week for the next 6 weeks then can stop if you're still doing great. Follow up with me as needed otherwise.

## 2017-01-27 NOTE — Progress Notes (Addendum)
Subjective:   Kara Wheeler is a 71 y.o. female who presents for a Subsequent Medicare Annual Wellness Visit.  Review of Systems    No ROS.  Medicare Wellness Visit.  Cardiac Risk Factors include: advanced age (>12men, >59 women);hypertension  Sleep patterns: no sleep issues, falls asleep easily, feels rested on waking, gets up 2 times nightly to void and sleeps 7 hours nightly. Has had some restless nights recently due to URI. Often takes a daytime nap. Home Safety/Smoke Alarms: Feels safe in home. Smoke alarms in place. Security camera in place. Living environment; residence and Firearm Safety: Lives w/ husband. 2-story house, bedroom on second floor, no firearms. Seat Belt Safety/Bike Helmet: Wears seat belt.   Counseling:   Eye Exam- Follows w/ Dr. Shanon Brow Daughtry  Dental- Follows w/ Dr. Mariea Clonts  Female:   Pap- Aged out       Mammo- last 11/07/16. BI-RADS CATEGORY  1: Negative.        Dexa scan- last 08/26/13. No report on file.      CCS- last 10/07/13 at [facility]. Normal exam. 10 year recall.     Objective:    Today's Vitals   01/28/17 0841  BP: 114/70  Pulse: 65  SpO2: 98%  Weight: 150 lb 3.2 oz (68.1 kg)  Height: 5\' 3"  (1.6 m)   Body mass index is 26.61 kg/m.  Wt Readings from Last 3 Encounters:  01/28/17 150 lb 3.2 oz (68.1 kg)  01/23/17 152 lb (68.9 kg)  01/22/17 151 lb 6 oz (68.7 kg)    Current Medications (verified) Outpatient Encounter Prescriptions as of 01/28/2017  Medication Sig  . aspirin 81 MG tablet Take 81 mg by mouth daily.    . Calcium Carbonate-Vitamin D (CALCIUM 600 + D PO) Take 1 tablet by mouth daily.    . Cholecalciferol (VITAMIN D-3 PO) Take 500 Units by mouth daily.   . fish oil-omega-3 fatty acids 1000 MG capsule Take 2 g by mouth daily.    . multivitamin (THERAGRAN) per tablet Take 1 tablet by mouth daily.    . rosuvastatin (CRESTOR) 20 MG tablet TAKE 1 TABLET (20 MG TOTAL) BY MOUTH DAILY.  . valsartan (DIOVAN) 160 MG  tablet Take 1 tablet (160 mg total) by mouth daily.  . [DISCONTINUED] HYDROcodone-homatropine (HYCODAN) 5-1.5 MG/5ML syrup Take 5 mLs by mouth at bedtime as needed for cough. (Patient not taking: Reported on 01/28/2017)   No facility-administered encounter medications on file as of 01/28/2017.     Allergies (verified) Patient has no known allergies.   History: Past Medical History:  Diagnosis Date  . Ankle fracture, right 2013  . Coronary atherosclerosis of native coronary artery 2005   Taxus drug-eluting stent to treat severe stenosis in the proximal LAD  . HTN (hypertension)   . Mixed dyslipidemia   . Osteoarthritis    Past Surgical History:  Procedure Laterality Date  . CERVICAL CONE BIOPSY  1978   benign  . CORONARY STENT PLACEMENT  11/2003   LAD  . TUBAL LIGATION Bilateral 1980   Family History  Problem Relation Age of Onset  . Heart attack Father 10       second at age 55  . Lymphoma Mother        Lymphoma  . Heart disease Brother   . Heart attack Paternal Grandmother   . Stroke Paternal Grandmother   . Heart attack Paternal Grandfather   . Stroke Paternal Grandfather   . Colon cancer Neg Hx   .  Breast cancer Neg Hx   . Diabetes Neg Hx    Social History   Occupational History  . 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 Smoking status: Former Smoker    Packs/day: 0.25    Years: 4.00    Types: Cigarettes    Start date: 08/26/1962    Quit date: 08/26/1966  . Smokeless tobacco: Never Used  . Alcohol use Yes  . Drug use: No     Comment: marijuana -- very little yrs ago  . Sexual activity: Yes    Partners: Male    Birth control/ protection: Surgical, Post-menopausal     Comment: tubal    Tobacco Counseling Counseling given: Not Answered   Activities of Daily Living In your present state of health, do you have any difficulty performing the following activities: 01/28/2017 04/02/2016  Hearing? N N  Vision? N N  Difficulty  concentrating or making decisions? N N  Walking or climbing stairs? N N  Dressing or bathing? N N  Doing errands, shopping? N N  Preparing Food and eating ? N -  Using the Toilet? N -  In the past six months, have you accidently leaked urine? N -  Do you have problems with loss of bowel control? N -  Managing your Medications? N -  Managing your Finances? N -  Housekeeping or managing your Housekeeping? N -  Some recent data might be hidden    Immunizations and Health Maintenance Immunization History  Administered Date(s) Administered  . Influenza-Unspecified 05/26/2015, 05/26/2016  . Pneumococcal Conjugate-13 04/02/2016  . Pneumococcal Polysaccharide-23 08/17/2014  . Tdap 04/21/2007  . Zoster 08/26/2009   There are no preventive care reminders to display for this patient.  Patient Care Team: Colon Branch, MD as PCP - General (Internal Medicine) Sherren Mocha, MD (Cardiology) Tommy Medal, MD as Consulting Physician (Internal Medicine) Wonda Horner, MD as Consulting Physician (Gastroenterology) Truett Mainland, DO as Consulting Physician (Gynecology) Moses Manners, OD as Consulting Physician (Optometry)  Indicate any recent Medical Services you may have received from other than [facility] providers in the past year (date may be approximate).     Assessment:   This is a routine wellness examination for [facility]. Physical assessment deferred to PCP.  Hearing/Vision screen  Hearing Screening   125Hz  250Hz  500Hz  1000Hz  2000Hz  3000Hz  4000Hz  6000Hz  8000Hz   Right ear:   Pass Pass Pass  Pass    Left ear:   Pass Pass Pass  Pass    Comments: Able to hear conversational tones w/o difficulty. No issues reported.  Vision Screening Comments: Wearing glasses. No vision issues reported.  Dietary issues and exercise activities discussed: Current Exercise Habits: Structured exercise class, Type of exercise: Other - see comments, Intensity: ModerateYMCA almost everyday-water aerobics,  Silver Sneakers, etc. "The more I move, the better I feel." Ballroom dancing w/ husband a few hours weekly.  Diet (meal preparation, eat out, water intake, caffeinated beverages, dairy products, fruits and vegetables): in general, a "healthy" diet  , well balanced, on average, 3 meals per day. Currently following Whole 30 diet- no dairy, concentrated sugars, white carbs/bread, alcohol, etc.  Plans to continue following a modified version of this diet (minimizing wine, sweets, breads) after the 30 days are up. Drinks lot of water. Has recently cut back on coffee intake.      Goals    . Weight (lb) < 140 lb (63.5 kg) (pt-stated)  Depression Screen PHQ 2/9 Scores 01/28/2017 04/02/2016 01/15/2016 10/04/2015  PHQ - 2 Score 0 0 0 0    Fall Risk Fall Risk  01/28/2017 04/02/2016 01/15/2016 10/04/2015  Falls in the past year? No No No No    Cognitive Function: MMSE - Mini Mental State Exam 01/28/2017  Orientation to time 5  Orientation to Place 5  Registration 3  Attention/ Calculation 5  Recall 3  Language- name 2 objects 2  Language- repeat 1  Language- follow 3 step command 3  Language- read & follow direction 1  Write a sentence 1  Copy design 1  Total score 30        Screening Tests Health Maintenance  Topic Date Due  . INFLUENZA VACCINE  03/26/2017  . TETANUS/TDAP  04/20/2017  . MAMMOGRAM  11/07/2017  . COLONOSCOPY  10/08/2023  . DEXA SCAN  Completed  . Hepatitis C Screening  Completed  . PNA vac Low Risk Adult  Completed      Plan:    Follow-up w/ PCP as scheduled.  Orders placed for bone density.  Bring a copy of your advance directives to your next office visit.  I have personally reviewed and noted the following in the patient's chart:   . Medical and social history . Use of alcohol, tobacco or illicit drugs  . Current medications and supplements . Functional ability and status . Nutritional status . Physical activity . Advanced directives . List of other  physicians . Vitals . Screenings to include cognitive, depression, and falls . Referrals and appointments  In addition, I have reviewed and discussed with patient certain preventive protocols, quality metrics, and best practice recommendations. A written personalized care plan for preventive services as well as general preventive health recommendations were provided to patient.     Dorrene German, RN   01/28/2017   Kathlene November, MD

## 2017-01-28 ENCOUNTER — Ambulatory Visit (INDEPENDENT_AMBULATORY_CARE_PROVIDER_SITE_OTHER): Payer: Medicare HMO | Admitting: *Deleted

## 2017-01-28 ENCOUNTER — Encounter: Payer: Self-pay | Admitting: *Deleted

## 2017-01-28 VITALS — BP 114/70 | HR 65 | Ht 63.0 in | Wt 150.2 lb

## 2017-01-28 DIAGNOSIS — Z0000 Encounter for general adult medical examination without abnormal findings: Secondary | ICD-10-CM | POA: Diagnosis not present

## 2017-01-28 DIAGNOSIS — Z780 Asymptomatic menopausal state: Secondary | ICD-10-CM

## 2017-01-28 NOTE — Patient Instructions (Addendum)
Kara Wheeler , Thank you for taking time to come for your Medicare Wellness Visit. I appreciate your ongoing commitment to your health goals. Please review the following plan we discussed and let me know if I can assist you in the future.   Bring a copy of your advance directives to your next office visit.  Consider the new shingles vaccine, Shingrix. This is available at most pharmacies.   These are the goals we discussed: Goals    . Weight (lb) < 140 lb (63.5 kg) (pt-stated)       This is a list of the screening recommended for you and due dates:  Health Maintenance  Topic Date Due  . Flu Shot  03/26/2017  . Tetanus Vaccine  04/20/2017  . Mammogram  11/07/2017  . Colon Cancer Screening  10/08/2023  . DEXA scan (bone density measurement)  Completed  .  Hepatitis C: One time screening is recommended by Center for Disease Control  (CDC) for  adults born from [birth year] through 1965.   Completed  . Pneumonia vaccines  Completed    Health Maintenance, Female Adopting a healthy lifestyle and getting preventive care can go a long way to promote health and wellness. Talk with your health care provider about what schedule of regular examinations is right for you. This is a good chance for you to check in with your provider about disease prevention and staying healthy. In between checkups, there are plenty of things you can do on your own. Experts have done a lot of research about which lifestyle changes and preventive measures are most likely to keep you healthy. Ask your health care provider for more information. Weight and diet Eat a healthy diet  Be sure to include plenty of vegetables, fruits, low-fat dairy products, and lean protein.  Do not eat a lot of foods high in solid fats, added sugars, or salt.  Get regular exercise. This is one of the most important things you can do for your health. ? Most adults should exercise for at least 150 minutes each week. The exercise should increase  your heart rate and make you sweat (moderate-intensity exercise). ? Most adults should also do strengthening exercises at least twice a week. This is in addition to the moderate-intensity exercise.  Maintain a healthy weight  Body mass index (BMI) is a measurement that can be used to identify possible weight problems. It estimates body fat based on height and weight. Your health care provider can help determine your BMI and help you achieve or maintain a healthy weight.  For females 23 years of age and older: ? A BMI below 18.5 is considered underweight. ? A BMI of 18.5 to 24.9 is normal. ? A BMI of 25 to 29.9 is considered overweight. ? A BMI of 30 and above is considered obese.  Watch levels of cholesterol and blood lipids  You should start having your blood tested for lipids and cholesterol at 70 years of age, then have this test every 5 years.  You may need to have your cholesterol levels checked more often if: ? Your lipid or cholesterol levels are high. ? You are older than 70 years of age. ? You are at high risk for heart disease.  Cancer screening Lung Cancer  Lung cancer screening is recommended for adults 33-44 years old who are at high risk for lung cancer because of a history of smoking.  A yearly low-dose CT scan of the lungs is recommended for people who: ?  Currently smoke. ? Have quit within the past 15 years. ? Have at least a 30-pack-year history of smoking. A pack year is smoking an average of one pack of cigarettes a day for 1 year.  Yearly screening should continue until it has been 15 years since you quit.  Yearly screening should stop if you develop a health problem that would prevent you from having lung cancer treatment.  Breast Cancer  Practice breast self-awareness. This means understanding how your breasts normally appear and feel.  It also means doing regular breast self-exams. Let your health care provider know about any changes, no matter how  small.  If you are in your 20s or 30s, you should have a clinical breast exam (CBE) by a health care provider every 1-3 years as part of a regular health exam.  If you are 78 or older, have a CBE every year. Also consider having a breast X-ray (mammogram) every year.  If you have a family history of breast cancer, talk to your health care provider about genetic screening.  If you are at high risk for breast cancer, talk to your health care provider about having an MRI and a mammogram every year.  Breast cancer gene (BRCA) assessment is recommended for women who have family members with BRCA-related cancers. BRCA-related cancers include: ? Breast. ? Ovarian. ? Tubal. ? Peritoneal cancers.  Results of the assessment will determine the need for genetic counseling and BRCA1 and BRCA2 testing.  Cervical Cancer Your health care provider may recommend that you be screened regularly for cancer of the pelvic organs (ovaries, uterus, and vagina). This screening involves a pelvic examination, including checking for microscopic changes to the surface of your cervix (Pap test). You may be encouraged to have this screening done every 3 years, beginning at age 49.  For women ages 31-65, health care providers may recommend pelvic exams and Pap testing every 3 years, or they may recommend the Pap and pelvic exam, combined with testing for human papilloma virus (HPV), every 5 years. Some types of HPV increase your risk of cervical cancer. Testing for HPV may also be done on women of any age with unclear Pap test results.  Other health care providers may not recommend any screening for nonpregnant women who are considered low risk for pelvic cancer and who do not have symptoms. Ask your health care provider if a screening pelvic exam is right for you.  If you have had past treatment for cervical cancer or a condition that could lead to cancer, you need Pap tests and screening for cancer for at least 20 years  after your treatment. If Pap tests have been discontinued, your risk factors (such as having a new sexual partner) need to be reassessed to determine if screening should resume. Some women have medical problems that increase the chance of getting cervical cancer. In these cases, your health care provider may recommend more frequent screening and Pap tests.  Colorectal Cancer  This type of cancer can be detected and often prevented.  Routine colorectal cancer screening usually begins at 70 years of age and continues through 70 years of age.  Your health care provider may recommend screening at an earlier age if you have risk factors for colon cancer.  Your health care provider may also recommend using home test kits to check for hidden blood in the stool.  A small camera at the end of a tube can be used to examine your colon directly (sigmoidoscopy or colonoscopy).  This is done to check for the earliest forms of colorectal cancer.  Routine screening usually begins at age 54.  Direct examination of the colon should be repeated every 5-10 years through 70 years of age. However, you may need to be screened more often if early forms of precancerous polyps or small growths are found.  Skin Cancer  Check your skin from head to toe regularly.  Tell your health care provider about any new moles or changes in moles, especially if there is a change in a mole's shape or color.  Also tell your health care provider if you have a mole that is larger than the size of a pencil eraser.  Always use sunscreen. Apply sunscreen liberally and repeatedly throughout the day.  Protect yourself by wearing long sleeves, pants, a wide-brimmed hat, and sunglasses whenever you are outside.  Heart disease, diabetes, and high blood pressure  High blood pressure causes heart disease and increases the risk of stroke. High blood pressure is more likely to develop in: ? People who have blood pressure in the high end of  the normal range (130-139/85-89 mm Hg). ? People who are overweight or obese. ? People who are African American.  If you are 55-66 years of age, have your blood pressure checked every 3-5 years. If you are 74 years of age or older, have your blood pressure checked every year. You should have your blood pressure measured twice-once when you are at a hospital or clinic, and once when you are not at a hospital or clinic. Record the average of the two measurements. To check your blood pressure when you are not at a hospital or clinic, you can use: ? An automated blood pressure machine at a pharmacy. ? A home blood pressure monitor.  If you are between 39 years and 6 years old, ask your health care provider if you should take aspirin to prevent strokes.  Have regular diabetes screenings. This involves taking a blood sample to check your fasting blood sugar level. ? If you are at a normal weight and have a low risk for diabetes, have this test once every three years after 70 years of age. ? If you are overweight and have a high risk for diabetes, consider being tested at a younger age or more often. Preventing infection Hepatitis B  If you have a higher risk for hepatitis B, you should be screened for this virus. You are considered at high risk for hepatitis B if: ? You were born in a country where hepatitis B is common. Ask your health care provider which countries are considered high risk. ? Your parents were born in a high-risk country, and you have not been immunized against hepatitis B (hepatitis B vaccine). ? You have HIV or AIDS. ? You use needles to inject street drugs. ? You live with someone who has hepatitis B. ? You have had sex with someone who has hepatitis B. ? You get hemodialysis treatment. ? You take certain medicines for conditions, including cancer, organ transplantation, and autoimmune conditions.  Hepatitis C  Blood testing is recommended for: ? Everyone born from [birth year]  through 1964. ? Anyone with known risk factors for hepatitis C.  Sexually transmitted infections (STIs)  You should be screened for sexually transmitted infections (STIs) including gonorrhea and chlamydia if: ? You are sexually active and are younger than 70 years of age. ? You are older than 70 years of age and your health care provider tells you that  you are at risk for this type of infection. ? Your sexual activity has changed since you were last screened and you are at an increased risk for chlamydia or gonorrhea. Ask your health care provider if you are at risk.  If you do not have HIV, but are at risk, it may be recommended that you take a prescription medicine daily to prevent HIV infection. This is called pre-exposure prophylaxis (PrEP). You are considered at risk if: ? You are sexually active and do not regularly use condoms or know the HIV status of your partner(s). ? You take drugs by injection. ? You are sexually active with a partner who has HIV.  Talk with your health care provider about whether you are at high risk of being infected with HIV. If you choose to begin PrEP, you should first be tested for HIV. You should then be tested every 3 months for as long as you are taking PrEP. Pregnancy  If you are premenopausal and you may become pregnant, ask your health care provider about preconception counseling.  If you may become pregnant, take 400 to 800 micrograms (mcg) of folic acid every day.  If you want to prevent pregnancy, talk to your health care provider about birth control (contraception). Osteoporosis and menopause  Osteoporosis is a disease in which the bones lose minerals and strength with aging. This can result in serious bone fractures. Your risk for osteoporosis can be identified using a bone density scan.  If you are 40 years of age or older, or if you are at risk for osteoporosis and fractures, ask your health care provider if you should be screened.  Ask  your health care provider whether you should take a calcium or vitamin D supplement to lower your risk for osteoporosis.  Menopause may have certain physical symptoms and risks.  Hormone replacement therapy may reduce some of these symptoms and risks. Talk to your health care provider about whether hormone replacement therapy is right for you. Follow these instructions at home:  Schedule regular health, dental, and eye exams.  Stay current with your immunizations.  Do not use any tobacco products including cigarettes, chewing tobacco, or electronic cigarettes.  If you are pregnant, do not drink alcohol.  If you are breastfeeding, limit how much and how often you drink alcohol.  Limit alcohol intake to no more than 1 drink per day for nonpregnant women. One drink equals 12 ounces of beer, 5 ounces of wine, or 1 ounces of hard liquor.  Do not use street drugs.  Do not share needles.  Ask your health care provider for help if you need support or information about quitting drugs.  Tell your health care provider if you often feel depressed.  Tell your health care provider if you have ever been abused or do not feel safe at home. This information is not intended to replace advice given to you by your health care provider. Make sure you discuss any questions you have with your health care provider. Document Released: 02/25/2011 Document Revised: 01/18/2016 Document Reviewed: 05/16/2015 Elsevier Interactive Patient Education  [employer].

## 2017-02-03 ENCOUNTER — Ambulatory Visit (HOSPITAL_BASED_OUTPATIENT_CLINIC_OR_DEPARTMENT_OTHER)
Admission: RE | Admit: 2017-02-03 | Discharge: 2017-02-03 | Disposition: A | Discharge status: Home / Self Care | Payer: Medicare HMO | Source: Ambulatory Visit | Attending: Internal Medicine | Admitting: Internal Medicine

## 2017-02-03 DIAGNOSIS — Z780 Asymptomatic menopausal state: Secondary | ICD-10-CM | POA: Diagnosis not present

## 2017-03-05 ENCOUNTER — Encounter: Payer: Self-pay | Admitting: Internal Medicine

## 2017-03-05 ENCOUNTER — Ambulatory Visit (INDEPENDENT_AMBULATORY_CARE_PROVIDER_SITE_OTHER): Payer: Medicare HMO | Admitting: Internal Medicine

## 2017-03-05 VITALS — BP 124/68 | HR 56 | Temp 97.6°F | Resp 14 | Ht 63.0 in | Wt 151.1 lb

## 2017-03-05 DIAGNOSIS — Z23 Encounter for immunization: Secondary | ICD-10-CM

## 2017-03-05 DIAGNOSIS — R739 Hyperglycemia, unspecified: Secondary | ICD-10-CM

## 2017-03-05 DIAGNOSIS — Z0001 Encounter for general adult medical examination with abnormal findings: Secondary | ICD-10-CM

## 2017-03-05 DIAGNOSIS — R102 Pelvic and perineal pain: Secondary | ICD-10-CM | POA: Diagnosis not present

## 2017-03-05 DIAGNOSIS — Z0000 Encounter for general adult medical examination without abnormal findings: Secondary | ICD-10-CM

## 2017-03-05 MED ORDER — ZOSTER VAC RECOMB ADJUVANTED 50 MCG/0.5ML IM SUSR: 0.5000 mL | Freq: Once | INTRAMUSCULAR | 1 refills | Status: AC

## 2017-03-05 NOTE — Progress Notes (Signed)
Pre visit review using our clinic review tool, if applicable. No additional management support is needed unless otherwise documented below in the visit note. 

## 2017-03-05 NOTE — Patient Instructions (Signed)
GO TO THE LAB : Get the blood work     GO TO THE FRONT DESK Schedule your next appointment for a  routine checkup in 8 months  

## 2017-03-05 NOTE — Progress Notes (Signed)
Subjective:    Patient ID: Kara Wheeler, female    DOB: Dec 23, 1945, 71 y.o.   MRN: [id number]  DOS:  03/05/2017 Type of visit - description : cpx Interval history:   No major concerns, feeling well   Review of Systems For the last few days has experienced mild discomfort at the lower abdomen, mostly at the left side. No fever chills. No nausea, vomiting or diarrhea. No dysuria or gross hematuria. Symptoms are more noticeable at night.   Other than above, a 14 point review of systems is negative    Past Medical History:  Diagnosis Date  . Ankle fracture, right 2013  . Coronary atherosclerosis of native coronary artery 2005   Taxus drug-eluting stent to treat severe stenosis in the proximal LAD  . HTN (hypertension)   . Mixed dyslipidemia   . Osteoarthritis     Past Surgical History:  Procedure Laterality Date  . CERVICAL CONE BIOPSY  1978   benign  . CORONARY STENT PLACEMENT  11/2003   LAD  . TUBAL LIGATION Bilateral 1980    Social History   Social History  . Marital status: Married    Spouse name: N/A  . Number of children: 1  . Years of education: N/A   Occupational History  . 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 Smoking status: Former Smoker    Packs/day: 0.25    Years: 4.00    Types: Cigarettes    Start date: 08/26/1962    Quit date: 08/26/1966  . Smokeless tobacco: Never Used  . Alcohol use Yes  . Drug use: No     Comment: marijuana -- very little yrs ago  . Sexual activity: Yes    Partners: Male    Birth control/ protection: Surgical, Post-menopausal     Comment: tubal   Other Topics Concern  . Not on file   Social History Narrative   Household-- pt and husband (Mr [place])     Family History  Problem Relation Age of Onset  . Heart attack Father 46       second at age 83  . Lymphoma Mother        Lymphoma  . Heart disease Brother   . Heart attack Paternal Grandmother   . Stroke Paternal  Grandmother   . Heart attack Paternal Grandfather   . Stroke Paternal Grandfather   . Colon cancer Neg Hx   . Breast cancer Neg Hx   . Diabetes Neg Hx      Allergies as of 03/05/2017   No Known Allergies     Medication List       Accurate as of 03/05/17  6:27 PM. Always use your most recent med list.          aspirin 81 MG tablet Take 81 mg by mouth daily.   CALCIUM 600 + D PO Take 1 tablet by mouth daily.   fish oil-omega-3 fatty acids 1000 MG capsule Take 2 g by mouth daily.   multivitamin per tablet Take 1 tablet by mouth daily.   rosuvastatin 20 MG tablet Commonly known as:  CRESTOR TAKE 1 TABLET (20 MG TOTAL) BY MOUTH DAILY.   valsartan 160 MG tablet Commonly known as:  DIOVAN Take 80 mg by mouth daily.   VITAMIN D-3 PO Take 500 Units by mouth daily.   Zoster Vac Recomb Adjuvanted injection Commonly known as:  SHINGRIX Inject 0.5 mLs into the muscle once.  Objective:   Physical Exam BP 124/68 (BP Location: Left Arm, Patient Position: Sitting, Cuff Size: Small)   Pulse (!) 56   Temp 97.6 F (36.4 C) (Oral)   Resp 14   Ht 5\' 3"  (1.6 m)   Wt 151 lb 2 oz (68.5 kg)   SpO2 98%   BMI 26.77 kg/m   General:   Well developed, well nourished . NAD.  Neck: No  thyromegaly  HEENT:  Normocephalic . Face symmetric, atraumatic Lungs:  CTA B Normal respiratory effort, no intercostal retractions, no accessory muscle use. Heart: RRR,  no murmur.  No pretibial edema bilaterally  Abdomen:  Not distended, soft, not tender but mild discomfort upon palpation of the lower abdomen, left side. No mass or rebound Skin: Exposed areas without rash. Not pale. Not jaundice Neurologic:  alert & oriented X3.  Speech normal, gait appropriate for age and unassisted Strength symmetric and appropriate for age.  Psych: Cognition and judgment appear intact.  Cooperative with normal attention span and concentration.  Behavior appropriate. No anxious or depressed  appearing.    Assessment & Plan:    Assessment New pt 09-2015 HTN Dyslipidemia Elevated LFTs per chart review: AST slightly high, normal ALT CV: Dr Burt Knack  CAD dx 2005, was asx >> stent  H/o false  + negative EKG Myoview 2013 negative.  stress echo normal 11-2014 H/o abnormal PAP, cone bx (remotely) H/o Fx distal fibula -R- 2013, no surgery  PLAN HTN: Continue Diovan 80 mg, taking labs High cholesterol: On Crestor, checking labs CAD: Asymptomatic. Mild abdominal discomfort for a few days, abdominal exam is benign, will check a UA and urine culture RTC 8 months.

## 2017-03-05 NOTE — Assessment & Plan Note (Signed)
-  Td 2008; pnm-23 2015; prevnar 03-2016;  zostavax 2011; shingrex Rx printed, s/e discussed  -Female care: saw Dr Nehemiah Settle 10-2015, was rec RTC PRN ; PAP 2013 wnl ; MMG 10-2016  --CCS: normal cscope 2015 -DEXA 01-2017 normal -Labs: CMP, FLP, CBC, A1c, TSH, UA, urine culture -diet  and exercise discussed

## 2017-03-05 NOTE — Assessment & Plan Note (Signed)
HTN: Continue Diovan 80 mg, taking labs High cholesterol: On Crestor, checking labs CAD: Asymptomatic. Mild abdominal discomfort for a few days, abdominal exam is benign, will check a UA and urine culture RTC 8 months.

## 2017-03-06 ENCOUNTER — Encounter: Payer: Self-pay | Admitting: Internal Medicine

## 2017-03-06 LAB — COMPREHENSIVE METABOLIC PANEL
ALT: 32 U/L (ref 0–35)
AST: 42 U/L — AB (ref 0–37)
Albumin: 4.5 g/dL (ref 3.5–5.2)
Alkaline Phosphatase: 60 U/L (ref 39–117)
BILIRUBIN TOTAL: 1 mg/dL (ref 0.2–1.2)
BUN: 20 mg/dL (ref 6–23)
CHLORIDE: 103 meq/L (ref 96–112)
CO2: 29 mEq/L (ref 19–32)
CREATININE: 1.07 mg/dL (ref 0.40–1.20)
Calcium: 10.9 mg/dL — ABNORMAL HIGH (ref 8.4–10.5)
GFR: 53.9 mL/min — ABNORMAL LOW (ref >60.00)
Glucose, Bld: 90 mg/dL (ref 70–99)
Potassium: 4.2 mEq/L (ref 3.5–5.1)
SODIUM: 141 meq/L (ref 135–145)
Total Protein: 6.8 g/dL (ref 6.0–8.3)

## 2017-03-06 LAB — CBC WITH DIFFERENTIAL/PLATELET
BASOS ABS: 0 10*3/uL (ref 0.0–0.1)
Basophils Relative: 0.6 % (ref 0.0–3.0)
Eosinophils Absolute: 0.2 10*3/uL (ref 0.0–0.7)
Eosinophils Relative: 3.3 % (ref 0.0–5.0)
HEMATOCRIT: 35.9 % — AB (ref 36.0–46.0)
Hemoglobin: 12.1 g/dL (ref 12.0–15.0)
LYMPHS PCT: 42.3 % (ref 12.0–46.0)
Lymphs Abs: 2.2 10*3/uL (ref 0.7–4.0)
MCHC: 33.6 g/dL (ref 30.0–36.0)
MCV: 88.7 fl (ref 78.0–100.0)
MONOS PCT: 9.7 % (ref 3.0–12.0)
Monocytes Absolute: 0.5 10*3/uL (ref 0.1–1.0)
NEUTROS ABS: 2.3 10*3/uL (ref 1.4–7.7)
NEUTROS PCT: 44.1 % (ref 43.0–77.0)
PLATELETS: 168 10*3/uL (ref 150.0–400.0)
RBC: 4.05 Mil/uL (ref 3.87–5.11)
RDW: 14.1 % (ref 11.5–15.5)
WBC: 5.3 10*3/uL (ref 4.0–10.5)

## 2017-03-06 LAB — URINALYSIS, ROUTINE W REFLEX MICROSCOPIC
Bilirubin Urine: NEGATIVE
KETONES UR: NEGATIVE
Leukocytes, UA: NEGATIVE
NITRITE: NEGATIVE
RBC / HPF: NONE SEEN (ref 0–?)
SPECIFIC GRAVITY, URINE: 1.01 (ref 1.000–1.030)
TOTAL PROTEIN, URINE-UPE24: NEGATIVE
URINE GLUCOSE: NEGATIVE
Urobilinogen, UA: 0.2 (ref 0.0–1.0)
pH: 6 (ref 5.0–8.0)

## 2017-03-06 LAB — LIPID PANEL
Cholesterol: 171 mg/dL (ref 0–200)
HDL: 66.2 mg/dL (ref >39.00)
LDL CALC: 88 mg/dL (ref 0–99)
NONHDL: 104.49
Total CHOL/HDL Ratio: 3
Triglycerides: 82 mg/dL (ref 0.0–149.0)
VLDL: 16.4 mg/dL (ref 0.0–40.0)

## 2017-03-06 LAB — HEMOGLOBIN A1C: Hgb A1c MFr Bld: 6 % (ref 4.6–6.5)

## 2017-03-06 LAB — TSH: TSH: 1.77 u[IU]/mL (ref 0.35–4.50)

## 2017-03-06 NOTE — Telephone Encounter (Signed)
Patient calling, requesting call back

## 2017-03-07 LAB — URINE CULTURE

## 2017-04-09 ENCOUNTER — Telehealth: Payer: Self-pay

## 2017-04-09 DIAGNOSIS — I10 Essential (primary) hypertension: Secondary | ICD-10-CM

## 2017-04-09 MED ORDER — LOSARTAN POTASSIUM 50 MG PO TABS: 50.0000 mg | ORAL_TABLET | Freq: Every day | ORAL | 1 refills | Status: DC

## 2017-04-09 NOTE — Telephone Encounter (Signed)
Pt on valsartan 160mg . Please advise.

## 2017-04-09 NOTE — Telephone Encounter (Signed)
Please switch to losartan 50 mg 1 by mouth daily #30 and one refill. Advice to watch her BP to be sure it remains okay. BMP in 2 weeks

## 2017-04-09 NOTE — Telephone Encounter (Signed)
MyChart message sent. Losartan 50mg  1 tab daily sent to pharmacy. BMP ordered.

## 2017-04-16 ENCOUNTER — Encounter: Payer: Self-pay | Admitting: Internal Medicine

## 2017-05-07 ENCOUNTER — Other Ambulatory Visit: Payer: Self-pay

## 2017-05-07 MED ORDER — LOSARTAN POTASSIUM 50 MG PO TABS: 50.0000 mg | ORAL_TABLET | Freq: Every day | ORAL | 0 refills | Status: DC

## 2017-05-20 DIAGNOSIS — J209 Acute bronchitis, unspecified: Secondary | ICD-10-CM | POA: Diagnosis not present

## 2017-06-04 ENCOUNTER — Encounter: Payer: Self-pay | Admitting: Internal Medicine

## 2017-06-04 ENCOUNTER — Other Ambulatory Visit: Payer: Self-pay | Admitting: Internal Medicine

## 2017-06-05 ENCOUNTER — Other Ambulatory Visit (INDEPENDENT_AMBULATORY_CARE_PROVIDER_SITE_OTHER): Payer: Medicare HMO

## 2017-06-05 DIAGNOSIS — I10 Essential (primary) hypertension: Secondary | ICD-10-CM | POA: Diagnosis not present

## 2017-06-05 LAB — BASIC METABOLIC PANEL
BUN: 16 mg/dL (ref 6–23)
CO2: 28 meq/L (ref 19–32)
CREATININE: 0.94 mg/dL (ref 0.40–1.20)
Calcium: 10.1 mg/dL (ref 8.4–10.5)
Chloride: 104 mEq/L (ref 96–112)
GFR: 62.55 mL/min (ref >60.00)
GLUCOSE: 130 mg/dL — AB (ref 70–99)
Potassium: 4.3 mEq/L (ref 3.5–5.1)
SODIUM: 138 meq/L (ref 135–145)

## 2017-06-09 MED ORDER — LOSARTAN POTASSIUM 50 MG PO TABS: 50.0000 mg | ORAL_TABLET | Freq: Every day | ORAL | 2 refills | Status: DC

## 2017-06-09 NOTE — Addendum Note (Signed)
Addended byDamita Dunnings D on: 06/09/2017 09:10 AM   Modules accepted: Orders

## 2017-06-30 ENCOUNTER — Other Ambulatory Visit: Payer: Self-pay

## 2017-06-30 MED ORDER — ROSUVASTATIN CALCIUM 20 MG PO TABS: 20.0000 mg | ORAL_TABLET | Freq: Every day | ORAL | 2 refills | Status: DC

## 2017-07-15 ENCOUNTER — Encounter: Payer: Self-pay | Admitting: Cardiovascular Disease

## 2017-07-15 ENCOUNTER — Ambulatory Visit: Payer: Medicare HMO | Admitting: Cardiovascular Disease

## 2017-07-15 VITALS — BP 130/70 | HR 69 | Ht 64.0 in | Wt 149.0 lb

## 2017-07-15 DIAGNOSIS — I10 Essential (primary) hypertension: Secondary | ICD-10-CM

## 2017-07-15 DIAGNOSIS — I2510 Atherosclerotic heart disease of native coronary artery without angina pectoris: Secondary | ICD-10-CM | POA: Diagnosis not present

## 2017-07-15 DIAGNOSIS — E785 Hyperlipidemia, unspecified: Secondary | ICD-10-CM | POA: Diagnosis not present

## 2017-07-15 NOTE — Patient Instructions (Signed)
Medication Instructions:  Your provider recommends that you continue on your current medications as directed. Please refer to the Current Medication list given to you today.    Labwork: None  Testing/Procedures: Your physician has requested that you have a stress echocardiogram. For further information please visit [email or web address]. Please follow instruction sheet as given.  Follow-Up: Your provider wants you to follow-up in: 1 year with Dr. Burt Knack. You will receive a reminder letter in the mail two months in advance. If you don't receive a letter, please call our office to schedule the follow-up appointment.    Any Other Special Instructions Will Be Listed Below (If Applicable).     If you need a refill on your cardiac medications before your next appointment, please call your pharmacy.

## 2017-07-15 NOTE — Progress Notes (Signed)
Cardiology Office Note Date:  07/15/2017   ID:  Kara Wheeler, DOB December 07, 1945, MRN [id number]  PCP:  Colon Branch, MD  Cardiologist:  Janne Lab, MD    Chief Complaint  Patient presents with  . Follow-up    CAD     History of Present Illness: Kara Wheeler is a 71 y.o. female who presents for followup of coronary artery disease. She underwent stenting of the LAD in 2005.  Her last stress test in 2016 was negative for ischemia.  She has always had a false positive EKG response to exercise.  The patient is here alone today.  She is doing well.  She denies chest pain, chest pressure, or shortness of breath.  No exertional symptoms.  She is compliant with her medications.   Past Medical History:  Diagnosis Date  . Ankle fracture, right 2013  . Coronary atherosclerosis of native coronary artery 2005   Taxus drug-eluting stent to treat severe stenosis in the proximal LAD  . HTN (hypertension)   . Mixed dyslipidemia   . Osteoarthritis     Past Surgical History:  Procedure Laterality Date  . CERVICAL CONE BIOPSY  1978   benign  . CORONARY STENT PLACEMENT  11/2003   LAD  . TUBAL LIGATION Bilateral 1980    Current Outpatient Medications  Medication Sig Dispense Refill  . aspirin 81 MG tablet Take 81 mg by mouth daily.      . Cholecalciferol (VITAMIN D-3 PO) Take 500 Units by mouth daily.     . fish oil-omega-3 fatty acids 1000 MG capsule Take 2 g by mouth daily.      Marland Kitchen losartan (COZAAR) 50 MG tablet Take 1 tablet (50 mg total) by mouth daily. 90 tablet 2  . Multiple Vitamin (MULTI VITAMIN) TABS Take 1 tablet by mouth daily.    . rosuvastatin (CRESTOR) 20 MG tablet Take 1 tablet (20 mg total) daily by mouth. 90 tablet 2   No current facility-administered medications for this visit.     Allergies:   Patient has no known allergies.   Social History:  The patient  reports that she quit smoking about 50 years ago. Her smoking use included cigarettes. She started  smoking about 54 years ago. She has a 1.00 pack-year smoking history. she has never used smokeless tobacco. She reports that she drinks alcohol. She reports that she does not use drugs.   Family History:  The patient's family history includes Heart attack in her paternal grandfather and paternal grandmother; Heart attack (age of onset: 1) in her father; Heart disease in her brother; Lymphoma in her mother; Stroke in her paternal grandfather and paternal grandmother.    ROS:  Please see the history of present illness.  Otherwise, review of systems is positive for nonproductive cough.  All other systems are reviewed and negative.    PHYSICAL EXAM: VS:  BP 130/70   Pulse 69   Ht 5\' 4"  (1.626 m)   Wt 149 lb (67.6 kg)   BMI 25.58 kg/m  , BMI Body mass index is 25.58 kg/m. GEN: Well nourished, well developed, in no acute distress  HEENT: normal  Neck: no JVD, no masses. No carotid bruits Cardiac: RRR without murmur or gallop                Respiratory:  clear to auscultation bilaterally, normal work of breathing GI: soft, nontender, nondistended, + BS MS: no deformity or atrophy  Ext: no pretibial edema, pedal pulses  2+= bilaterally Skin: warm and dry, no rash Neuro:  Strength and sensation are intact Psych: euthymic mood, full affect  EKG:  EKG is ordered today. The ekg ordered today shows NSR 69 bpm, within normal limits  Recent Labs: 03/05/2017: ALT 32; Hemoglobin 12.1; Platelets 168.0; TSH 1.77 06/05/2017: BUN 16; Creatinine, Ser 0.94; Potassium 4.3; Sodium 138   Lipid Panel     Component Value Date/Time   CHOL 171 03/05/2017 1636   TRIG 82.0 03/05/2017 1636   HDL 66.20 03/05/2017 1636   CHOLHDL 3 03/05/2017 1636   VLDL 16.4 03/05/2017 1636   LDLCALC 88 03/05/2017 1636      Wt Readings from Last 3 Encounters:  07/15/17 149 lb (67.6 kg)  03/05/17 151 lb 2 oz (68.5 kg)  01/28/17 150 lb 3.2 oz (68.1 kg)    ASSESSMENT AND PLAN: 1.  Coronary artery disease, native  vessel, without angina: The patient appears stable on aspirin for antiplatelet therapy, losartan, and a statin drug.  Since she was asymptomatic at presentation, will check an exercise echocardiogram for surveillance of ischemic heart disease.  Of note she has had a false positive ECG in the past.  2.  Hyperlipidemia: Treated with a high intensity statin drug using Crestor 20 mg daily.  Most recent lipids reviewed as outlined above.  3.  Hypertension: Blood pressure well controlled on losartan 50 mg daily.  Current medicines are reviewed with the patient today.  The patient does not have concerns regarding medicines.  Labs/ tests ordered today include:   Orders Placed This Encounter  Procedures  . EKG 12-Lead  . ECHOCARDIOGRAM STRESS TEST    Disposition:   FU one year  Signed, Janne Lab, MD  07/15/2017 5:38 PM    [facility] [address], [place], [place]  [address] Phone: [phone]; Fax: [phone]

## 2017-07-27 IMAGING — CR DG HIP (WITH OR WITHOUT PELVIS) 2-3V*R*
3 series · 3 of 3 positions shown · non-contrast
Comparison: None.

CLINICAL DATA: Pain for 3 months

EXAM:
DG HIP (WITH OR WITHOUT PELVIS) 2-3V RIGHT

[t pelvis a.p.]
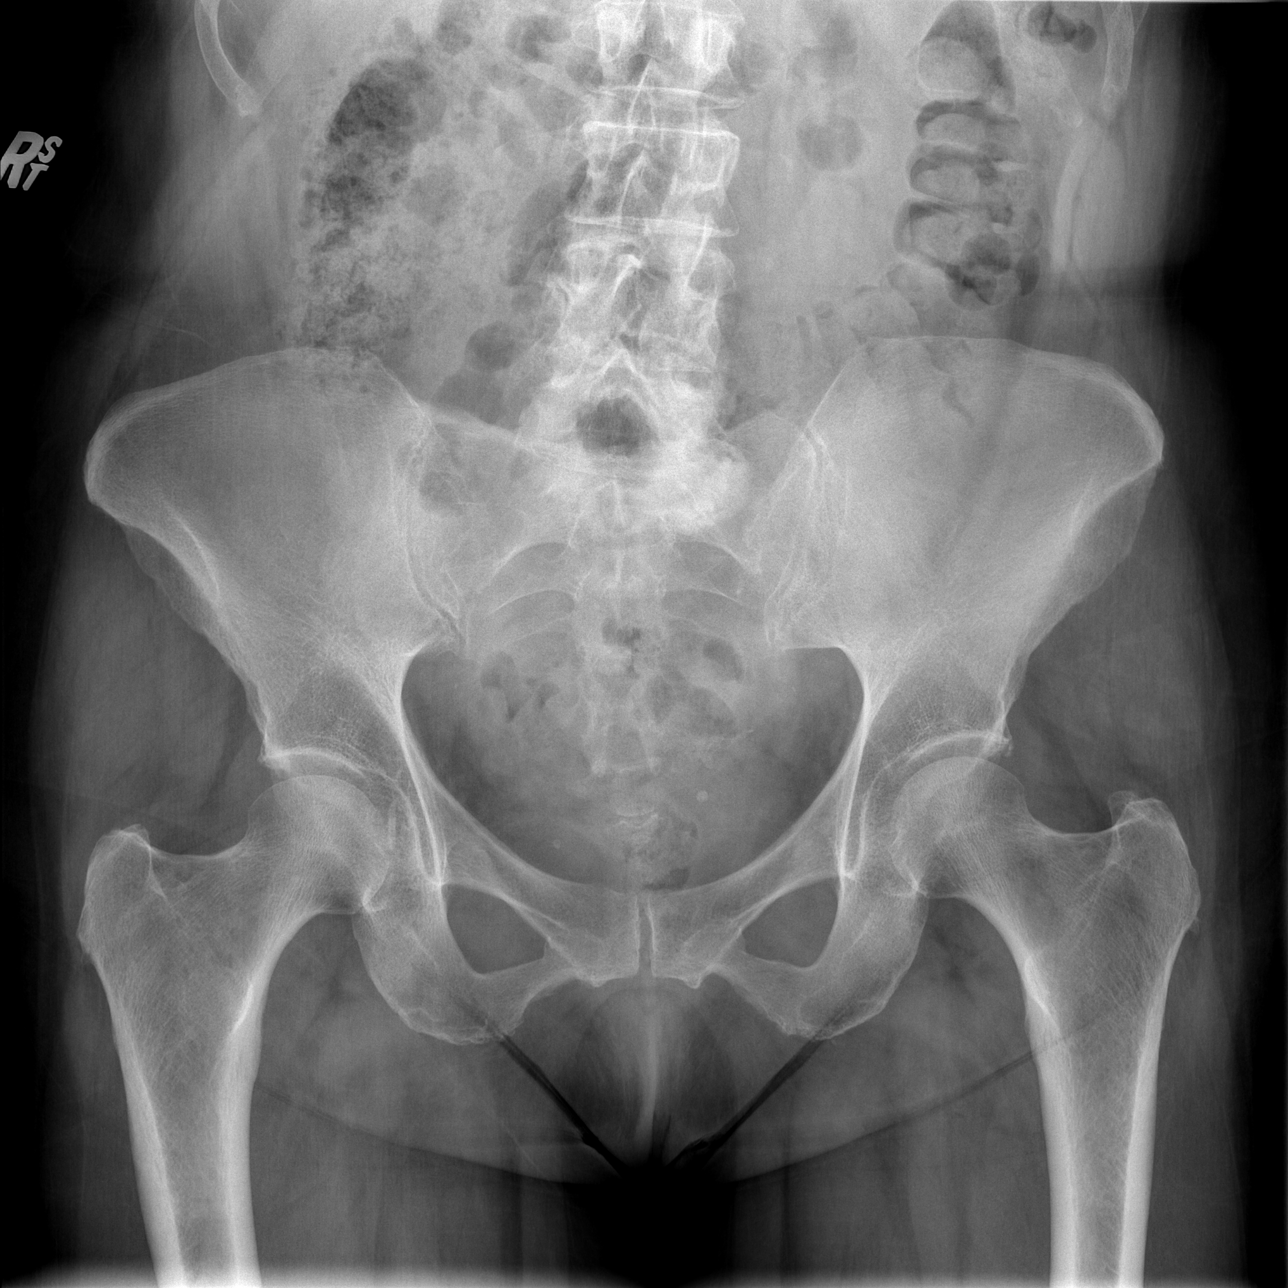

[t hip ap right]
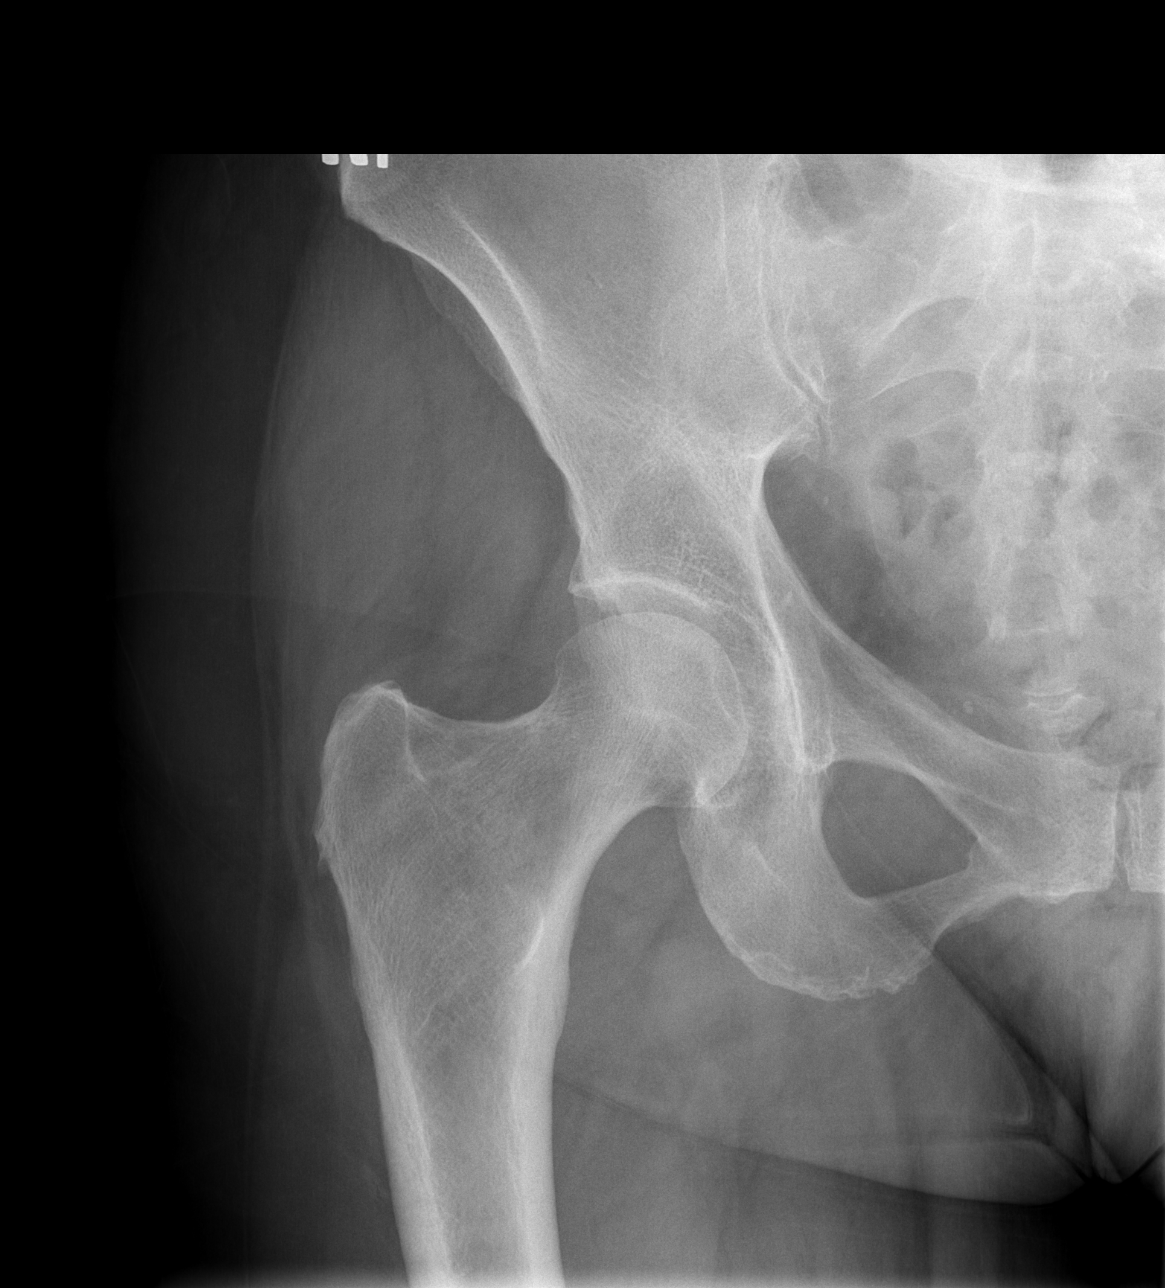

[t hip frog leg right]
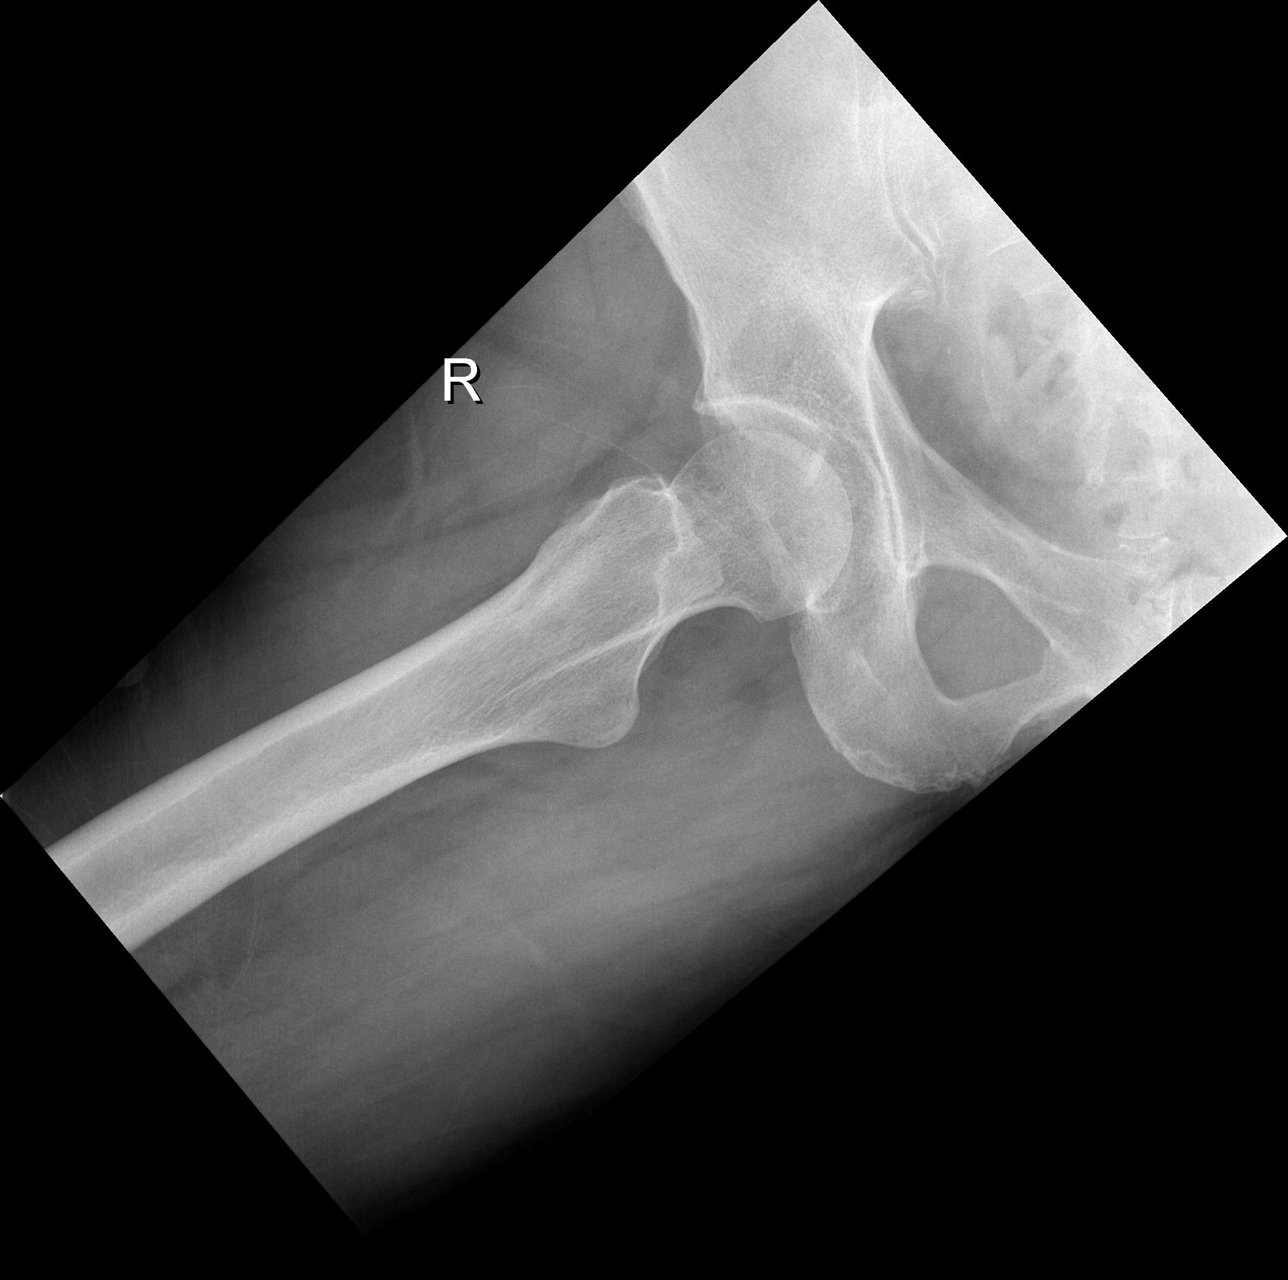

[3 of 3 positions shown; findings below may reference images not displayed]

FINDINGS: Frontal pelvis as well as frontal and lateral right hip images were
obtained. There is no fracture or dislocation. The joint spaces
appear normal. There is slight lower lumbar levoscoliosis.
IMPRESSION: Hip joints appear symmetric bilaterally. No fracture or dislocation.
Mild lower lumbar levoscoliosis.

## 2017-07-27 IMAGING — CR DG FEMUR 2+V*R*
4 series · 4 of 4 positions shown · non-contrast
Comparison: None.

CLINICAL DATA: Pain for 3 months in the right hip and leg.

EXAM:
RIGHT FEMUR 2 VIEWS

[t femur with hip  ap right]
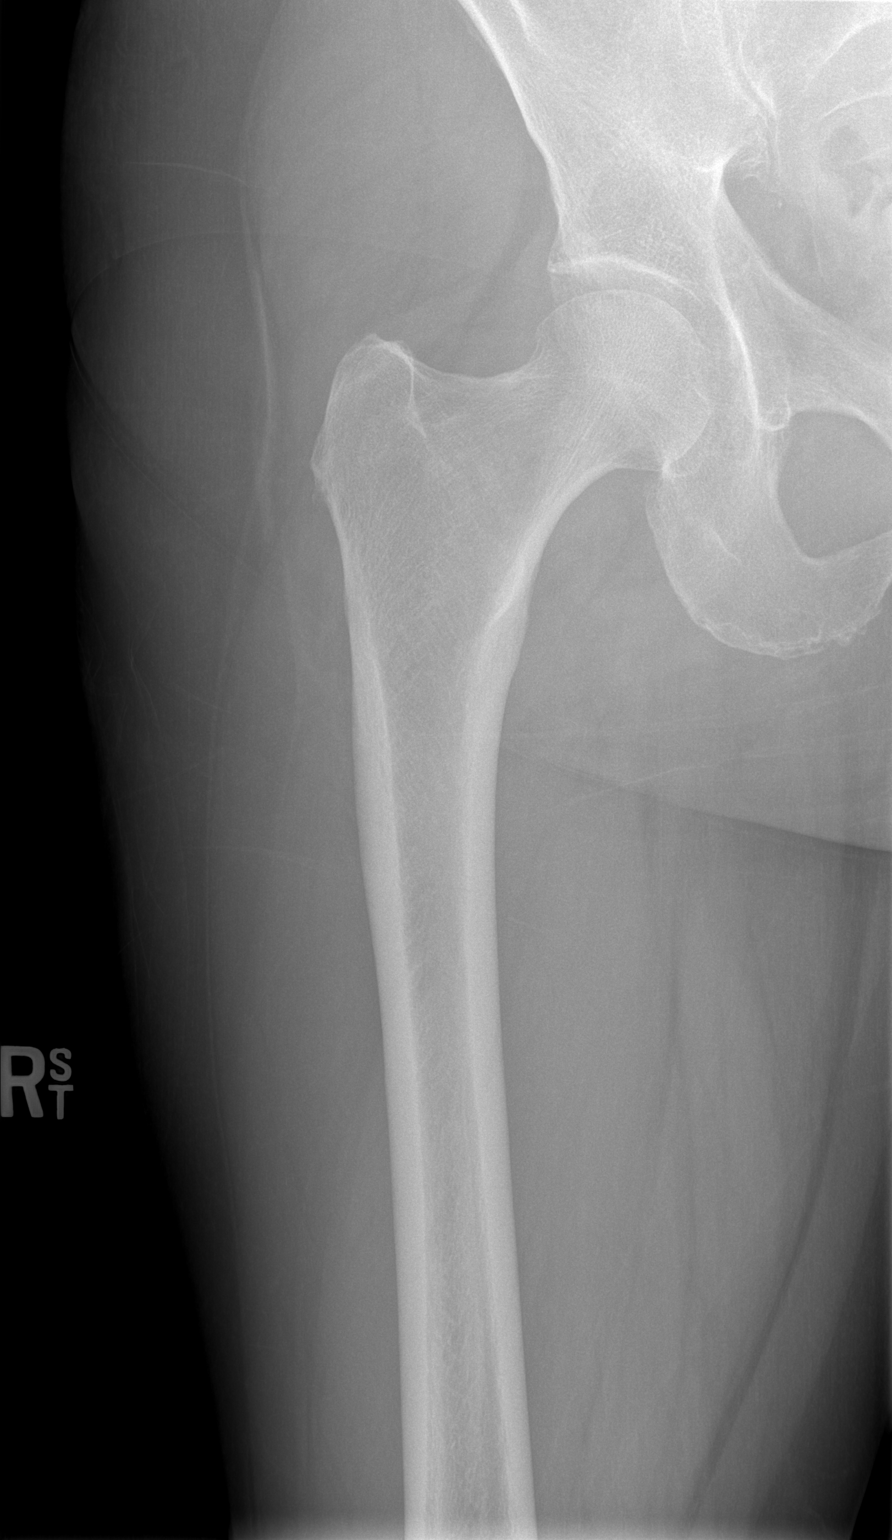

[t femur with knee ap right]
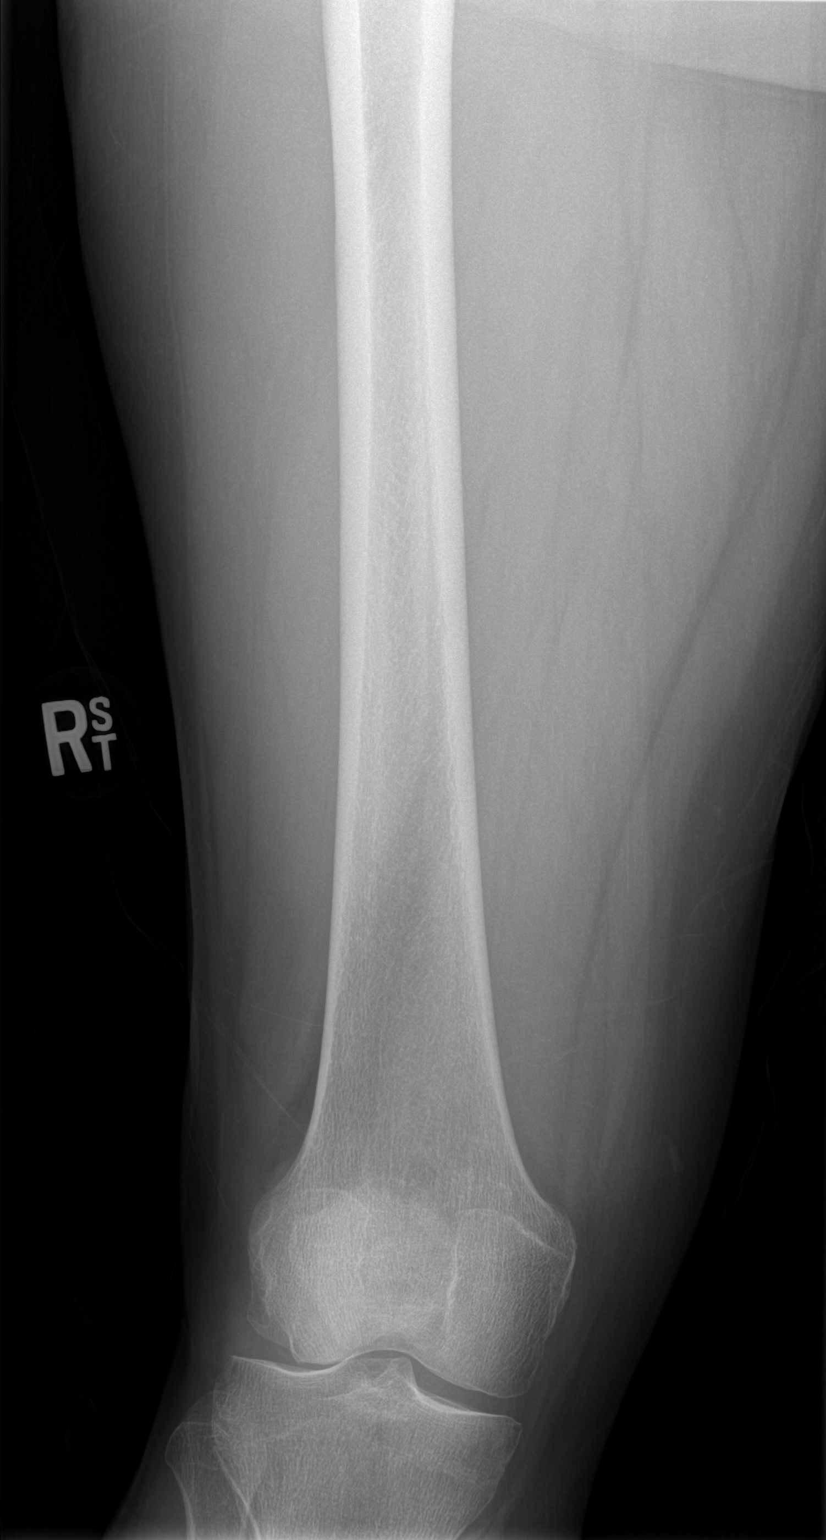

[t femur with hip lat right]
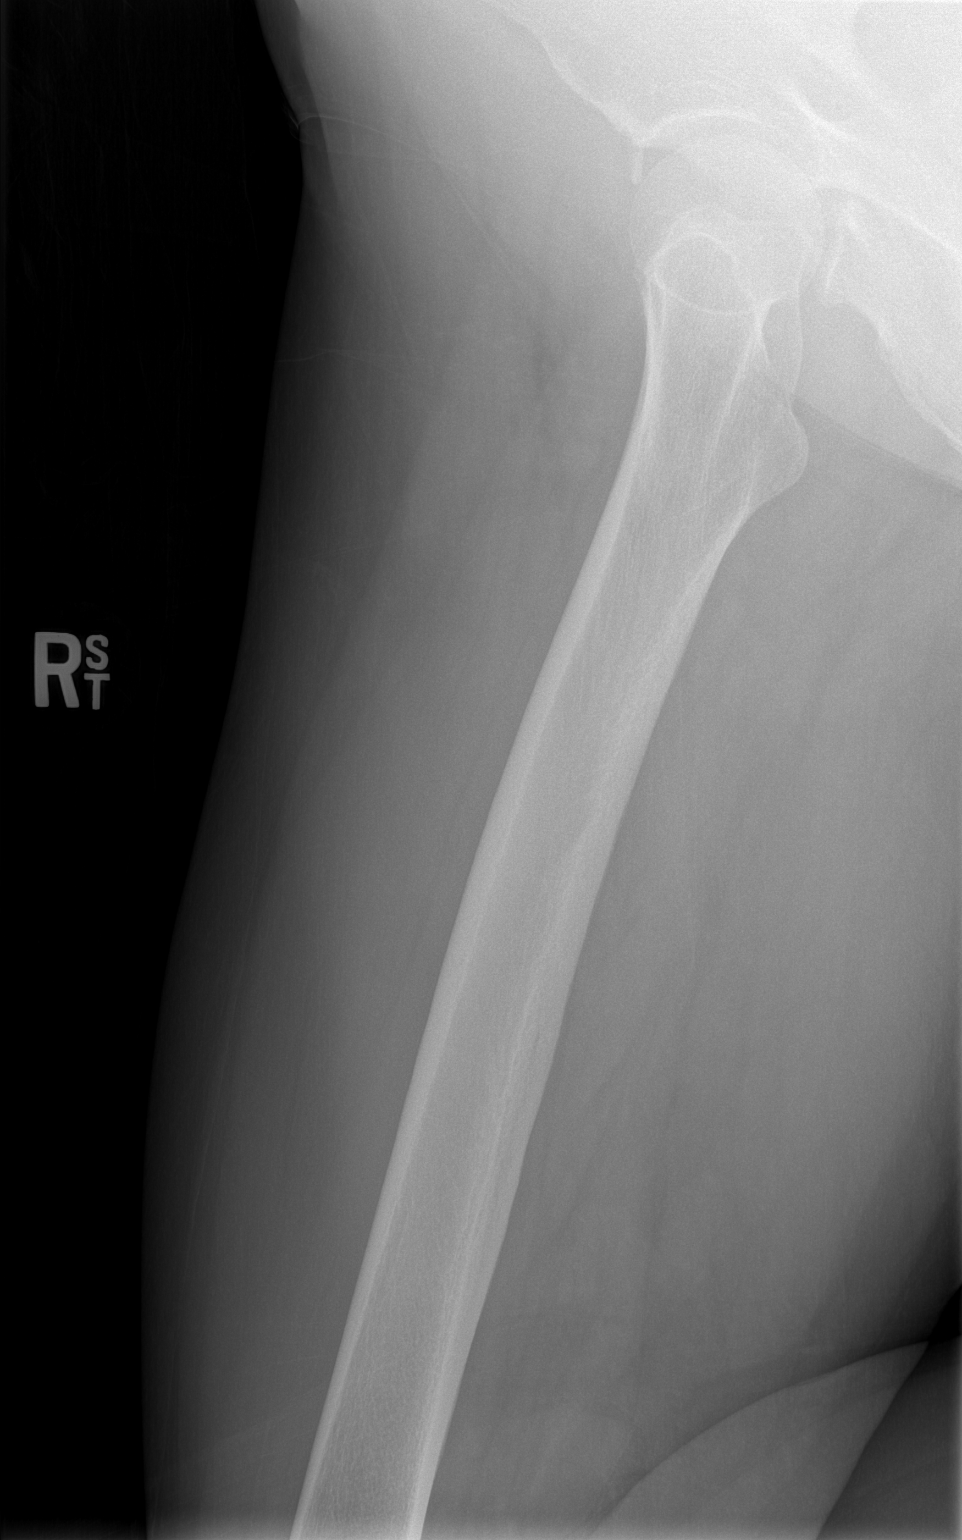

[t femur with knee lat right]
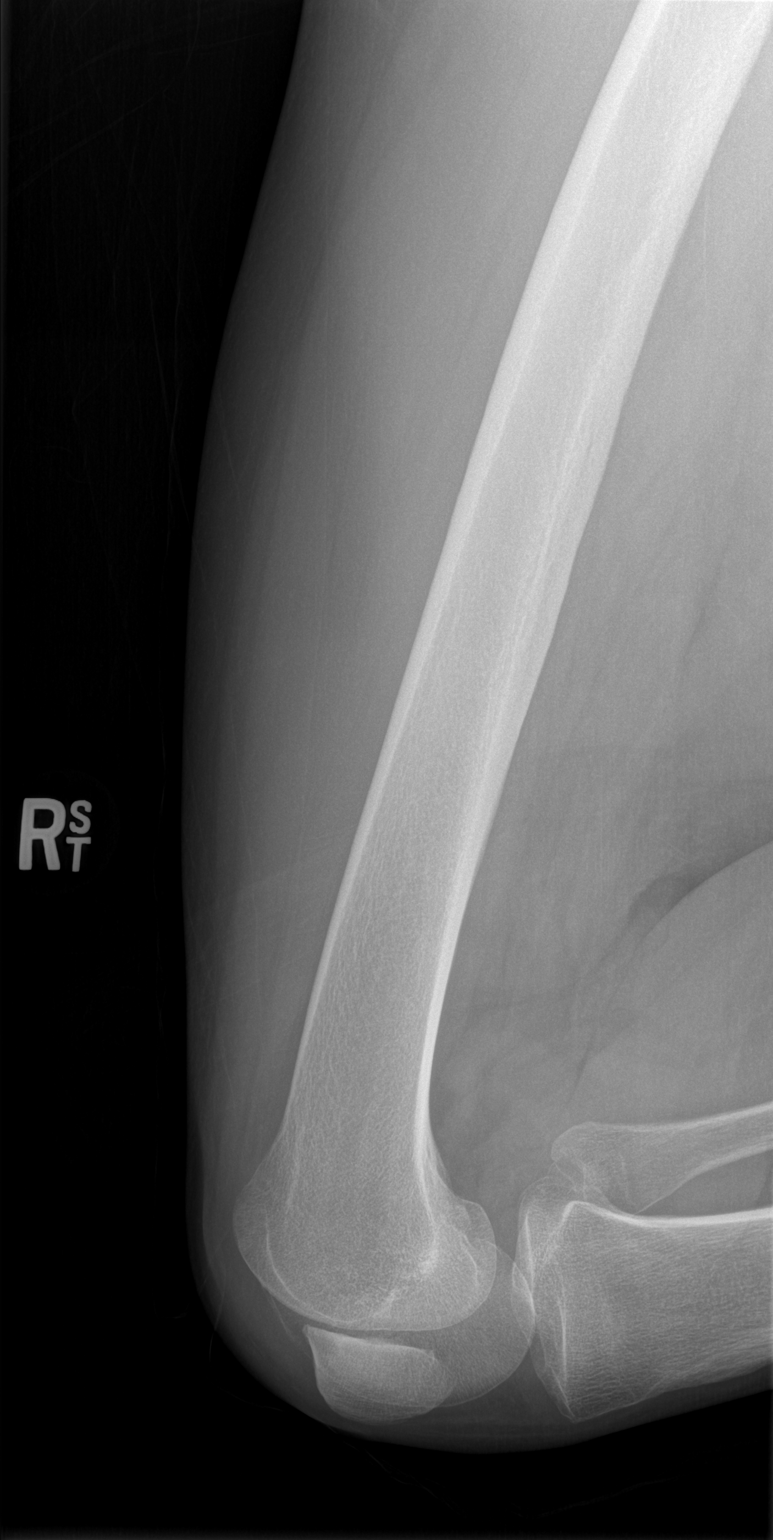

[4 of 4 positions shown; findings below may reference images not displayed]

FINDINGS: There is no evidence of fracture or other focal bone lesions. Soft
tissues are unremarkable.
IMPRESSION: No acute bony abnormality.

## 2017-08-11 ENCOUNTER — Ambulatory Visit: Payer: Self-pay | Admitting: Cardiovascular Disease

## 2017-09-01 ENCOUNTER — Other Ambulatory Visit (HOSPITAL_COMMUNITY): Payer: Self-pay

## 2017-09-04 ENCOUNTER — Telehealth (HOSPITAL_COMMUNITY): Payer: Self-pay | Admitting: *Deleted

## 2017-09-04 NOTE — Telephone Encounter (Signed)
Patient given detailed instructions per Stress Test Requisition Sheet for test on 09/08/17 at 7:30.Patient Notified to arrive 30 minutes early, and that it is imperative to arrive on time for appointment to keep from having the test rescheduled.  Patient verbalized understanding. Kara Wheeler

## 2017-09-08 ENCOUNTER — Ambulatory Visit (HOSPITAL_COMMUNITY): Payer: Medicare HMO

## 2017-09-08 ENCOUNTER — Ambulatory Visit (HOSPITAL_COMMUNITY): Payer: Medicare HMO | Attending: Cardiovascular Disease

## 2017-09-08 ENCOUNTER — Other Ambulatory Visit: Payer: Self-pay

## 2017-09-08 DIAGNOSIS — I2510 Atherosclerotic heart disease of native coronary artery without angina pectoris: Secondary | ICD-10-CM | POA: Diagnosis not present

## 2017-09-08 DIAGNOSIS — E785 Hyperlipidemia, unspecified: Secondary | ICD-10-CM | POA: Insufficient documentation

## 2017-09-08 DIAGNOSIS — I10 Essential (primary) hypertension: Secondary | ICD-10-CM | POA: Diagnosis not present

## 2017-09-08 LAB — ECHOCARDIOGRAM STRESS TEST
Reg peak vel: 172 cm/s
TRMAXVEL: 172 cm/s

## 2017-09-25 ENCOUNTER — Telehealth: Payer: Self-pay | Admitting: Cardiovascular Disease

## 2017-09-25 NOTE — Telephone Encounter (Signed)
Informed patient that I have not called her since we spoke about her echo results. She thinks the message may have come through delayed. She was grateful for call back.

## 2017-09-25 NOTE — Telephone Encounter (Signed)
New message ° °Pt verbalized that she is returning call for RN °

## 2017-10-07 DIAGNOSIS — H524 Presbyopia: Secondary | ICD-10-CM | POA: Diagnosis not present

## 2017-10-07 DIAGNOSIS — H35033 Hypertensive retinopathy, bilateral: Secondary | ICD-10-CM | POA: Diagnosis not present

## 2017-12-18 ENCOUNTER — Other Ambulatory Visit: Payer: Self-pay | Admitting: Internal Medicine

## 2017-12-18 DIAGNOSIS — Z1231 Encounter for screening mammogram for malignant neoplasm of breast: Secondary | ICD-10-CM

## 2017-12-30 ENCOUNTER — Ambulatory Visit (HOSPITAL_BASED_OUTPATIENT_CLINIC_OR_DEPARTMENT_OTHER)
Admission: RE | Admit: 2017-12-30 | Discharge: 2017-12-30 | Disposition: A | Discharge status: Home / Self Care | Payer: Medicare HMO | Source: Ambulatory Visit | Attending: Internal Medicine | Admitting: Internal Medicine

## 2017-12-30 DIAGNOSIS — Z1231 Encounter for screening mammogram for malignant neoplasm of breast: Secondary | ICD-10-CM | POA: Insufficient documentation

## 2018-01-30 ENCOUNTER — Ambulatory Visit: Payer: Self-pay | Admitting: *Deleted

## 2018-02-10 ENCOUNTER — Ambulatory Visit: Payer: Medicare HMO | Admitting: *Deleted

## 2018-02-12 NOTE — Progress Notes (Deleted)
Subjective:   Kara Wheeler is a 72 y.o. female who presents for Medicare Annual (Subsequent) preventive examination.  Review of Systems: No ROS.  Medicare Wellness Visit. Additional risk factors are reflected in the social history.   Sleep patterns:   Home Safety/Smoke Alarms: Feels safe in home. Smoke alarms in place.  Living environment; residence and Firearm Safety:    Female:  Mammo- utd       Dexa scan- utd       CCS-due 10/09/23    Objective:     Vitals: There were no vitals taken for this visit.  There is no height or weight on file to calculate BMI.  Advanced Directives 01/28/2017 01/24/2016  Does Patient Have a Medical Advance Directive? No No  Would patient like information on creating a medical advance directive? Yes (MAU/Ambulatory/Procedural Areas - Information given) No - patient declined information    Tobacco Social History   Tobacco Use  Smoking Status Former Smoker  . Packs/day: 0.25  . Years: 4.00  . Pack years: 1.00  . Types: Cigarettes  . Start date: 08/26/1962  . Last attempt to quit: 08/26/1966  . Years since quitting: 51.5  Smokeless Tobacco Never Used     Counseling given: Not Answered   Clinical Intake:                       Past Medical History:  Diagnosis Date  . Ankle fracture, right 2013  . Coronary atherosclerosis of native coronary artery 2005   Taxus drug-eluting stent to treat severe stenosis in the proximal LAD  . HTN (hypertension)   . Mixed dyslipidemia   . Osteoarthritis    Past Surgical History:  Procedure Laterality Date  . CERVICAL CONE BIOPSY  1978   benign  . CORONARY STENT PLACEMENT  11/2003   LAD  . TUBAL LIGATION Bilateral 1980   Family History  Problem Relation Age of Onset  . Heart attack Father 16       second at age 14  . Lymphoma Mother        Lymphoma  . Heart disease Brother   . Heart attack Paternal Grandmother   . Stroke Paternal Grandmother   . Heart attack Paternal  Grandfather   . Stroke Paternal Grandfather   . Colon cancer Neg Hx   . Breast cancer Neg Hx   . Diabetes Neg Hx    Social History   Socioeconomic History  . Marital status: Married    Spouse name: Not on file  . Number of children: 1  . Years of education: Not on file  . Highest education level: Not on file  Occupational History  . 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 Financial resource strain: Not on file  . Food insecurity:    Worry: Not on file    Inability: Not on file  . Transportation needs:    Medical: Not on file    Non-medical: Not on file  Tobacco Use  . Smoking status: Former Smoker    Packs/day: 0.25    Years: 4.00    Pack years: 1.00    Types: Cigarettes    Start date: 08/26/1962    Last attempt to quit: 08/26/1966    Years since quitting: 51.5  . Smokeless tobacco: Never Used  Substance and Sexual Activity  . Alcohol use: Yes  . Drug use: No    Comment: marijuana -- very little yrs  ago  . Sexual activity: Yes    Partners: Male    Birth control/protection: Surgical, Post-menopausal    Comment: tubal  Lifestyle  . Physical activity:    Days per week: Not on file    Minutes per session: Not on file  . Stress: Not on file  Relationships  . Social connections:    Talks on phone: Not on file    Gets together: Not on file    Attends religious service: Not on file    Active member of club or organization: Not on file    Attends meetings of clubs or organizations: Not on file    Relationship status: Not on file  Other Topics Concern  . Not on file  Social History Narrative   Household-- pt and husband (Mr [place])    Outpatient Encounter Medications as of 02/17/2018  Medication Sig  . aspirin 81 MG tablet Take 81 mg by mouth daily.    . Cholecalciferol (VITAMIN D-3 PO) Take 500 Units by mouth daily.   . fish oil-omega-3 fatty acids 1000 MG capsule Take 2 g by mouth daily.    Marland Kitchen losartan (COZAAR) 50 MG tablet Take 1  tablet (50 mg total) by mouth daily.  . Multiple Vitamin (MULTI VITAMIN) TABS Take 1 tablet by mouth daily.  . rosuvastatin (CRESTOR) 20 MG tablet Take 1 tablet (20 mg total) daily by mouth.   No facility-administered encounter medications on file as of 02/17/2018.     Activities of Daily Living No flowsheet data found.  Patient Care Team: Colon Branch, MD as PCP - General (Internal Medicine) Sherren Mocha, MD (Cardiology) Tommy Medal, MD as Consulting Physician (Internal Medicine) Wonda Horner, MD as Consulting Physician (Gastroenterology) Truett Mainland, DO as Consulting Physician (Gynecology) Moses Manners, OD as Consulting Physician (Optometry)    Assessment:   This is a routine wellness examination for [facility]. Physical assessment deferred to PCP.  Exercise Activities and Dietary recommendations   Diet (meal preparation, eat out, water intake, caffeinated beverages, dairy products, fruits and vegetables): {Desc; diets:16563} Breakfast: Lunch:  Dinner:      Goals    . Weight (lb) < 140 lb (63.5 kg) (pt-stated)       Fall Risk Fall Risk  01/28/2017 04/02/2016 01/15/2016 10/04/2015  Falls in the past year? No No No No   Depression Screen PHQ 2/9 Scores 01/28/2017 04/02/2016 01/15/2016 10/04/2015  PHQ - 2 Score 0 0 0 0     Cognitive Function MMSE - Mini Mental State Exam 01/28/2017  Orientation to time 5  Orientation to Place 5  Registration 3  Attention/ Calculation 5  Recall 3  Language- name 2 objects 2  Language- repeat 1  Language- follow 3 step command 3  Language- read & follow direction 1  Write a sentence 1  Copy design 1  Total score 30        Immunization History  Administered Date(s) Administered  . Influenza-Unspecified 05/26/2015, 05/26/2016, 06/04/2017  . Pneumococcal Conjugate-13 04/02/2016  . Pneumococcal Polysaccharide-23 08/17/2014  . Td 03/05/2017  . Tdap 04/21/2007  . Zoster 08/26/2009    Screening Tests Health Maintenance  Topic  Date Due  . INFLUENZA VACCINE  03/26/2018  . MAMMOGRAM  12/31/2018  . COLONOSCOPY  10/08/2023  . TETANUS/TDAP  03/06/2027  . DEXA SCAN  Completed  . Hepatitis C Screening  Completed  . PNA vac Low Risk Adult  Completed       Plan:   ***  I have personally reviewed and noted the following in the patient's chart:   . Medical and social history . Use of alcohol, tobacco or illicit drugs  . Current medications and supplements . Functional ability and status . Nutritional status . Physical activity . Advanced directives . List of other physicians . Hospitalizations, surgeries, and ER visits in previous 12 months . Vitals . Screenings to include cognitive, depression, and falls . Referrals and appointments  In addition, I have reviewed and discussed with patient certain preventive protocols, quality metrics, and best practice recommendations. A written personalized care plan for preventive services as well as general preventive health recommendations were provided to patient.     Shela Nevin, [place]  02/12/2018

## 2018-02-17 ENCOUNTER — Encounter: Payer: Self-pay | Admitting: *Deleted

## 2018-02-17 ENCOUNTER — Ambulatory Visit (INDEPENDENT_AMBULATORY_CARE_PROVIDER_SITE_OTHER): Payer: Medicare HMO | Admitting: *Deleted

## 2018-02-17 ENCOUNTER — Ambulatory Visit: Payer: Medicare HMO | Admitting: *Deleted

## 2018-02-17 VITALS — BP 120/64 | HR 65 | Ht 64.0 in | Wt 151.2 lb

## 2018-02-17 DIAGNOSIS — Z0000 Encounter for general adult medical examination without abnormal findings: Secondary | ICD-10-CM

## 2018-02-17 NOTE — Progress Notes (Addendum)
Subjective:   Kara Wheeler is a 72 y.o. female who presents for Medicare Annual (Subsequent) preventive examination.  Review of Systems: No ROS.  Medicare Wellness Visit. Additional risk factors are reflected in the social history.  Cardiac Risk Factors include: advanced age (>25men, >67 women);dyslipidemia;hypertension Sleep patterns: Sleeps about 7 hrs. Naps as needed.   Home Safety/Smoke Alarms: Feels safe in home. Smoke alarms in place.  Living environment; residence and Firearm Safety: Lives with husband in 2 story home. Bedroom on 2nd floor.    Female:    Mammo- utd       Dexa scan- utd       CCS- due 2025 Eye- wears glasses. Yearly. Eye Care.     Objective:     Vitals: BP 120/64 (BP Location: Left Arm, Patient Position: Sitting, Cuff Size: Normal)   Pulse 65   Ht 5\' 4"  (1.626 m)   Wt 151 lb 3.2 oz (68.6 kg)   SpO2 96%   BMI 25.95 kg/m   Body mass index is 25.95 kg/m.  Advanced Directives 02/17/2018 01/28/2017 01/24/2016  Does Patient Have a Medical Advance Directive? No No No  Would patient like information on creating a medical advance directive? Yes (MAU/Ambulatory/Procedural Areas - Information given) Yes (MAU/Ambulatory/Procedural Areas - Information given) No - patient declined information    Tobacco Social History   Tobacco Use  Smoking Status Former Smoker  . Packs/day: 0.25  . Years: 4.00  . Pack years: 1.00  . Types: Cigarettes  . Start date: 08/26/1962  . Last attempt to quit: 08/26/1966  . Years since quitting: 51.5  Smokeless Tobacco Never Used     Counseling given: Not Answered   Clinical Intake: Pain : No/denies pain     Past Medical History:  Diagnosis Date  . Ankle fracture, right 2013  . Coronary atherosclerosis of native coronary artery 2005   Taxus drug-eluting stent to treat severe stenosis in the proximal LAD  . HTN (hypertension)   . Mixed dyslipidemia   . Osteoarthritis    Past Surgical History:  Procedure Laterality  Date  . CERVICAL CONE BIOPSY  1978   benign  . CORONARY STENT PLACEMENT  11/2003   LAD  . TUBAL LIGATION Bilateral 1980   Family History  Problem Relation Age of Onset  . Heart attack Father 55       second at age 81  . Lymphoma Mother        Lymphoma  . Heart disease Brother   . Heart attack Paternal Grandmother   . Stroke Paternal Grandmother   . Heart attack Paternal Grandfather   . Stroke Paternal Grandfather   . Colon cancer Neg Hx   . Breast cancer Neg Hx   . Diabetes Neg Hx    Social History   Socioeconomic History  . Marital status: Married    Spouse name: Not on file  . Number of children: 1  . Years of education: Not on file  . Highest education level: Not on file  Occupational History  . 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 Financial resource strain: Not on file  . Food insecurity:    Worry: Not on file    Inability: Not on file  . Transportation needs:    Medical: Not on file    Non-medical: Not on file  Tobacco Use  . Smoking status: Former Smoker    Packs/day: 0.25    Years: 4.00  Pack years: 1.00    Types: Cigarettes    Start date: 08/26/1962    Last attempt to quit: 08/26/1966    Years since quitting: 51.5  . Smokeless tobacco: Never Used  Substance and Sexual Activity  . Alcohol use: Yes    Comment: glass of  wine daily.  . Drug use: No    Comment: marijuana -- very little yrs ago  . Sexual activity: Yes    Partners: Male    Birth control/protection: Surgical, Post-menopausal    Comment: tubal  Lifestyle  . Physical activity:    Days per week: Not on file    Minutes per session: Not on file  . Stress: Not on file  Relationships  . Social connections:    Talks on phone: Not on file    Gets together: Not on file    Attends religious service: Not on file    Active member of club or organization: Not on file    Attends meetings of clubs or organizations: Not on file    Relationship status: Not on file    Other Topics Concern  . Not on file  Social History Narrative   Household-- pt and husband (Mr [place])    Outpatient Encounter Medications as of 02/17/2018  Medication Sig  . aspirin 81 MG tablet Take 81 mg by mouth daily.    . Cholecalciferol (VITAMIN D-3 PO) Take 500 Units by mouth daily.   . fish oil-omega-3 fatty acids 1000 MG capsule Take 2 g by mouth daily.    Marland Kitchen losartan (COZAAR) 50 MG tablet Take 1 tablet (50 mg total) by mouth daily.  . Multiple Vitamin (MULTI VITAMIN) TABS Take 1 tablet by mouth daily.  . rosuvastatin (CRESTOR) 20 MG tablet Take 1 tablet (20 mg total) daily by mouth.   No facility-administered encounter medications on file as of 02/17/2018.     Activities of Daily Living In your present state of health, do you have any difficulty performing the following activities: 02/17/2018  Hearing? N  Vision? N  Difficulty concentrating or making decisions? N  Walking or climbing stairs? N  Dressing or bathing? N  Doing errands, shopping? N  Some recent data might be hidden    Patient Care Team: Colon Branch, MD as PCP - General (Internal Medicine) Sherren Mocha, MD (Cardiology) Tommy Medal, MD as Consulting Physician (Internal Medicine) Wonda Horner, MD as Consulting Physician (Gastroenterology) Truett Mainland, DO as Consulting Physician (Gynecology) Moses Manners, OD as Consulting Physician (Optometry)    Assessment:   This is a routine wellness examination for [facility]. Physical assessment deferred to PCP.  Exercise Activities and Dietary recommendations Current Exercise Habits: Home exercise routine, Time (Minutes): 60, Frequency (Times/Week): 4, Weekly Exercise (Minutes/Week): 240, Intensity: Mild, Exercise limited by: None identified Diet (meal preparation, eat out, water intake, caffeinated beverages, dairy products, fruits and vegetables): in general, a "healthy" diet  , well balanced    Goals    . Maintain current healthy lifestyle.    .  Weight (lb) < 140 lb (63.5 kg) (pt-stated)       Fall Risk Fall Risk  02/17/2018 01/28/2017 04/02/2016 01/15/2016 10/04/2015  Falls in the past year? No No No No No    Depression Screen PHQ 2/9 Scores 02/17/2018 01/28/2017 04/02/2016 01/15/2016  PHQ - 2 Score 0 0 0 0     Cognitive Function MMSE - Mini Mental State Exam 01/28/2017  Orientation to time 5  Orientation to Place 5  Registration  3  Attention/ Calculation 5  Recall 3  Language- name 2 objects 2  Language- repeat 1  Language- follow 3 step command 3  Language- read & follow direction 1  Write a sentence 1  Copy design 1  Total score 30        Immunization History  Administered Date(s) Administered  . Influenza-Unspecified 05/26/2015, 05/26/2016, 06/04/2017  . Pneumococcal Conjugate-13 04/02/2016  . Pneumococcal Polysaccharide-23 08/17/2014  . Td 03/05/2017  . Tdap 04/21/2007  . Zoster 08/26/2009   Screening Tests Health Maintenance  Topic Date Due  . INFLUENZA VACCINE  03/26/2018  . MAMMOGRAM  12/31/2018  . COLONOSCOPY  10/08/2023  . TETANUS/TDAP  03/06/2027  . DEXA SCAN  Completed  . Hepatitis C Screening  Completed  . PNA vac Low Risk Adult  Completed       Plan:     Please schedule your next medicare wellness visit with me in 1 yr.  Continue to eat heart healthy diet (full of fruits, vegetables, whole grains, lean protein, water--limit salt, fat, and sugar intake) and increase physical activity as tolerated.  Continue doing brain stimulating activities (puzzles, reading, adult coloring books, staying active) to keep memory sharp.   Bring a copy of your living will and/or healthcare power of attorney to your next office visit.   I have personally reviewed and noted the following in the patient's chart:   . Medical and social history . Use of alcohol, tobacco or illicit drugs  . Current medications and supplements . Functional ability and status . Nutritional status . Physical activity . Advanced  directives . List of other physicians . Hospitalizations, surgeries, and ER visits in previous 12 months . Vitals . Screenings to include cognitive, depression, and falls . Referrals and appointments  In addition, I have reviewed and discussed with patient certain preventive protocols, quality metrics, and best practice recommendations. A written personalized care plan for preventive services as well as general preventive health recommendations were provided to patient.     Naaman Plummer [place], [place]  02/17/2018   Kathlene November, MD

## 2018-02-17 NOTE — Patient Instructions (Signed)
Please schedule your next medicare wellness visit with me in 1 yr.  Continue to eat heart healthy diet (full of fruits, vegetables, whole grains, lean protein, water--limit salt, fat, and sugar intake) and increase physical activity as tolerated.  Continue doing brain stimulating activities (puzzles, reading, adult coloring books, staying active) to keep memory sharp.   Bring a copy of your living will and/or healthcare power of attorney to your next office visit.   Ms. Kara Wheeler , Thank you for taking time to come for your Medicare Wellness Visit. I appreciate your ongoing commitment to your health goals. Please review the following plan we discussed and let me know if I can assist you in the future.   These are the goals we discussed: Goals    . Maintain current healthy lifestyle.    . Weight (lb) < 140 lb (63.5 kg) (pt-stated)       This is a list of the screening recommended for you and due dates:  Health Maintenance  Topic Date Due  . Flu Shot  03/26/2018  . Mammogram  12/31/2018  . Colon Cancer Screening  10/08/2023  . Tetanus Vaccine  03/06/2027  . DEXA scan (bone density measurement)  Completed  .  Hepatitis C: One time screening is recommended by Center for Disease Control  (CDC) for  adults born from [birth year] through 1964.   Completed  . Pneumonia vaccines  Completed    Health Maintenance for Postmenopausal Women Menopause is a normal process in which your reproductive ability comes to an end. This process happens gradually over a span of months to years, usually between the ages of 51 and 16. Menopause is complete when you have missed 12 consecutive menstrual periods. It is important to talk with your health care provider about some of the most common conditions that affect postmenopausal women, such as heart disease, cancer, and bone loss (osteoporosis). Adopting a healthy lifestyle and getting preventive care can help to promote your health and wellness. Those actions can also  lower your chances of developing some of these common conditions. What should I know about menopause? During menopause, you may experience a number of symptoms, such as:  Moderate-to-severe hot flashes.  Night sweats.  Decrease in sex drive.  Mood swings.  Headaches.  Tiredness.  Irritability.  Memory problems.  Insomnia.  Choosing to treat or not to treat menopausal changes is an individual decision that you make with your health care provider. What should I know about hormone replacement therapy and supplements? Hormone therapy products are effective for treating symptoms that are associated with menopause, such as hot flashes and night sweats. Hormone replacement carries certain risks, especially as you become older. If you are thinking about using estrogen or estrogen with progestin treatments, discuss the benefits and risks with your health care provider. What should I know about heart disease and stroke? Heart disease, heart attack, and stroke become more likely as you age. This may be due, in part, to the hormonal changes that your body experiences during menopause. These can affect how your body processes dietary fats, triglycerides, and cholesterol. Heart attack and stroke are both medical emergencies. There are many things that you can do to help prevent heart disease and stroke:  Have your blood pressure checked at least every 1-2 years. High blood pressure causes heart disease and increases the risk of stroke.  If you are 27-52 years old, ask your health care provider if you should take aspirin to prevent a heart attack or  a stroke.  Do not use any tobacco products, including cigarettes, chewing tobacco, or electronic cigarettes. If you need help quitting, ask your health care provider.  It is important to eat a healthy diet and maintain a healthy weight. ? Be sure to include plenty of vegetables, fruits, low-fat dairy products, and lean protein. ? Avoid eating foods  that are high in solid fats, added sugars, or salt (sodium).  Get regular exercise. This is one of the most important things that you can do for your health. ? Try to exercise for at least 150 minutes each week. The type of exercise that you do should increase your heart rate and make you sweat. This is known as moderate-intensity exercise. ? Try to do strengthening exercises at least twice each week. Do these in addition to the moderate-intensity exercise.  Know your numbers.Ask your health care provider to check your cholesterol and your blood glucose. Continue to have your blood tested as directed by your health care provider.  What should I know about cancer screening? There are several types of cancer. Take the following steps to reduce your risk and to catch any cancer development as early as possible. Breast Cancer  Practice breast self-awareness. ? This means understanding how your breasts normally appear and feel. ? It also means doing regular breast self-exams. Let your health care provider know about any changes, no matter how small.  If you are 63 or older, have a clinician do a breast exam (clinical breast exam or CBE) every year. Depending on your age, family history, and medical history, it may be recommended that you also have a yearly breast X-ray (mammogram).  If you have a family history of breast cancer, talk with your health care provider about genetic screening.  If you are at high risk for breast cancer, talk with your health care provider about having an MRI and a mammogram every year.  Breast cancer (BRCA) gene test is recommended for women who have family members with BRCA-related cancers. Results of the assessment will determine the need for genetic counseling and BRCA1 and for BRCA2 testing. BRCA-related cancers include these types: ? Breast. This occurs in males or females. ? Ovarian. ? Tubal. This may also be called fallopian tube cancer. ? Cancer of the  abdominal or pelvic lining (peritoneal cancer). ? Prostate. ? Pancreatic.  Cervical, Uterine, and Ovarian Cancer Your health care provider may recommend that you be screened regularly for cancer of the pelvic organs. These include your ovaries, uterus, and vagina. This screening involves a pelvic exam, which includes checking for microscopic changes to the surface of your cervix (Pap test).  For women ages 21-65, health care providers may recommend a pelvic exam and a Pap test every three years. For women ages 19-65, they may recommend the Pap test and pelvic exam, combined with testing for human papilloma virus (HPV), every five years. Some types of HPV increase your risk of cervical cancer. Testing for HPV may also be done on women of any age who have unclear Pap test results.  Other health care providers may not recommend any screening for nonpregnant women who are considered low risk for pelvic cancer and have no symptoms. Ask your health care provider if a screening pelvic exam is right for you.  If you have had past treatment for cervical cancer or a condition that could lead to cancer, you need Pap tests and screening for cancer for at least 20 years after your treatment. If Pap tests  have been discontinued for you, your risk factors (such as having a new sexual partner) need to be reassessed to determine if you should start having screenings again. Some women have medical problems that increase the chance of getting cervical cancer. In these cases, your health care provider may recommend that you have screening and Pap tests more often.  If you have a family history of uterine cancer or ovarian cancer, talk with your health care provider about genetic screening.  If you have vaginal bleeding after reaching menopause, tell your health care provider.  There are currently no reliable tests available to screen for ovarian cancer.  Lung Cancer Lung cancer screening is recommended for adults  58-84 years old who are at high risk for lung cancer because of a history of smoking. A yearly low-dose CT scan of the lungs is recommended if you:  Currently smoke.  Have a history of at least 30 pack-years of smoking and you currently smoke or have quit within the past 15 years. A pack-year is smoking an average of one pack of cigarettes per day for one year.  Yearly screening should:  Continue until it has been 15 years since you quit.  Stop if you develop a health problem that would prevent you from having lung cancer treatment.  Colorectal Cancer  This type of cancer can be detected and can often be prevented.  Routine colorectal cancer screening usually begins at age 7 and continues through age 38.  If you have risk factors for colon cancer, your health care provider may recommend that you be screened at an earlier age.  If you have a family history of colorectal cancer, talk with your health care provider about genetic screening.  Your health care provider may also recommend using home test kits to check for hidden blood in your stool.  A small camera at the end of a tube can be used to examine your colon directly (sigmoidoscopy or colonoscopy). This is done to check for the earliest forms of colorectal cancer.  Direct examination of the colon should be repeated every 5-10 years until age 41. However, if early forms of precancerous polyps or small growths are found or if you have a family history or genetic risk for colorectal cancer, you may need to be screened more often.  Skin Cancer  Check your skin from head to toe regularly.  Monitor any moles. Be sure to tell your health care provider: ? About any new moles or changes in moles, especially if there is a change in a mole's shape or color. ? If you have a mole that is larger than the size of a pencil eraser.  If any of your family members has a history of skin cancer, especially at a young age, talk with your health  care provider about genetic screening.  Always use sunscreen. Apply sunscreen liberally and repeatedly throughout the day.  Whenever you are outside, protect yourself by wearing long sleeves, pants, a wide-brimmed hat, and sunglasses.  What should I know about osteoporosis? Osteoporosis is a condition in which bone destruction happens more quickly than new bone creation. After menopause, you may be at an increased risk for osteoporosis. To help prevent osteoporosis or the bone fractures that can happen because of osteoporosis, the following is recommended:  If you are 68-24 years old, get at least 1,000 mg of calcium and at least 600 mg of vitamin D per day.  If you are older than age 17 but younger than  age 6, get at least 1,200 mg of calcium and at least 600 mg of vitamin D per day.  If you are older than age 37, get at least 1,200 mg of calcium and at least 800 mg of vitamin D per day.  Smoking and excessive alcohol intake increase the risk of osteoporosis. Eat foods that are rich in calcium and vitamin D, and do weight-bearing exercises several times each week as directed by your health care provider. What should I know about how menopause affects my mental health? Depression may occur at any age, but it is more common as you become older. Common symptoms of depression include:  Low or sad mood.  Changes in sleep patterns.  Changes in appetite or eating patterns.  Feeling an overall lack of motivation or enjoyment of activities that you previously enjoyed.  Frequent crying spells.  Talk with your health care provider if you think that you are experiencing depression. What should I know about immunizations? It is important that you get and maintain your immunizations. These include:  Tetanus, diphtheria, and pertussis (Tdap) booster vaccine.  Influenza every year before the flu season begins.  Pneumonia vaccine.  Shingles vaccine.  Your health care provider may also  recommend other immunizations. This information is not intended to replace advice given to you by your health care provider. Make sure you discuss any questions you have with your health care provider. Document Released: 10/04/2005 Document Revised: 03/01/2016 Document Reviewed: 05/16/2015 Elsevier Interactive Patient Education  2018 [employer].

## 2018-03-06 ENCOUNTER — Encounter: Payer: Self-pay | Admitting: Internal Medicine

## 2018-03-06 ENCOUNTER — Ambulatory Visit (INDEPENDENT_AMBULATORY_CARE_PROVIDER_SITE_OTHER): Payer: Medicare HMO | Admitting: Internal Medicine

## 2018-03-06 VITALS — BP 126/68 | HR 64 | Temp 98.0°F | Resp 16 | Ht 64.0 in | Wt 150.0 lb

## 2018-03-06 DIAGNOSIS — Z0000 Encounter for general adult medical examination without abnormal findings: Secondary | ICD-10-CM | POA: Diagnosis not present

## 2018-03-06 DIAGNOSIS — E785 Hyperlipidemia, unspecified: Secondary | ICD-10-CM | POA: Diagnosis not present

## 2018-03-06 DIAGNOSIS — R739 Hyperglycemia, unspecified: Secondary | ICD-10-CM

## 2018-03-06 NOTE — Progress Notes (Signed)
Subjective:    Patient ID: Kara Wheeler, female    DOB: 01-27-1946, 72 y.o.   MRN: [id number]  DOS:  03/06/2018 Type of visit - description : CPX Interval history: No major concerns, feeling well, good med compliance.  Review of Systems  A 14 point review of systems is negative     Past Medical History:  Diagnosis Date  . Ankle fracture, right 2013  . Coronary atherosclerosis of native coronary artery 2005   Taxus drug-eluting stent to treat severe stenosis in the proximal LAD  . HTN (hypertension)   . Mixed dyslipidemia   . Osteoarthritis     Past Surgical History:  Procedure Laterality Date  . CERVICAL CONE BIOPSY  1978   benign  . CORONARY STENT PLACEMENT  11/2003   LAD  . TUBAL LIGATION Bilateral 1980    Social History   Socioeconomic History  . Marital status: Married    Spouse name: Not on file  . Number of children: 1  . Years of education: Not on file  . Highest education level: Not on file  Occupational History  . 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 Financial resource strain: Not on file  . Food insecurity:    Worry: Not on file    Inability: Not on file  . Transportation needs:    Medical: Not on file    Non-medical: Not on file  Tobacco Use  . Smoking status: Former Smoker    Packs/day: 0.25    Years: 4.00    Pack years: 1.00    Types: Cigarettes    Start date: 08/26/1962    Last attempt to quit: 08/26/1966    Years since quitting: 51.5  . Smokeless tobacco: Never Used  Substance and Sexual Activity  . Alcohol use: Yes    Comment: glass of  wine daily.  . Drug use: No    Comment: marijuana -- very little yrs ago  . Sexual activity: Yes    Partners: Male    Birth control/protection: Surgical, Post-menopausal    Comment: tubal  Lifestyle  . Physical activity:    Days per week: Not on file    Minutes per session: Not on file  . Stress: Not on file  Relationships  . Social connections:    Talks  on phone: Not on file    Gets together: Not on file    Attends religious service: Not on file    Active member of club or organization: Not on file    Attends meetings of clubs or organizations: Not on file    Relationship status: Not on file  . Intimate partner violence:    Fear of current or ex partner: Not on file    Emotionally abused: Not on file    Physically abused: Not on file    Forced sexual activity: Not on file  Other Topics Concern  . Not on file  Social History Narrative   Household-- pt and husband (Mr [place])     Family History  Problem Relation Age of Onset  . Heart attack Father 11       second at age 39  . Lymphoma Mother        Lymphoma  . Heart disease Brother   . Heart attack Paternal Grandmother   . Stroke Paternal Grandmother   . Heart attack Paternal Grandfather   . Stroke Paternal Grandfather   . Colon cancer Neg Hx   .  Breast cancer Neg Hx   . Diabetes Neg Hx      Allergies as of 03/06/2018   No Known Allergies     Medication List        Accurate as of 03/06/18 11:59 PM. Always use your most recent med list.          aspirin 81 MG tablet Take 81 mg by mouth daily.   fish oil-omega-3 fatty acids 1000 MG capsule Take 2 g by mouth daily.   losartan 50 MG tablet Commonly known as:  COZAAR Take 1 tablet (50 mg total) by mouth daily.   Multi Vitamin Tabs Take 1 tablet by mouth daily.   rosuvastatin 20 MG tablet Commonly known as:  CRESTOR Take 1 tablet (20 mg total) daily by mouth.   VITAMIN D-3 PO Take 500 Units by mouth daily.          Objective:   Physical Exam BP 126/68 (BP Location: Left Arm, Patient Position: Sitting, Cuff Size: Small)   Pulse 64   Temp 98 F (36.7 C) (Oral)   Resp 16   Ht 5\' 4"  (1.626 m)   Wt 150 lb (68 kg)   SpO2 97%   BMI 25.75 kg/m  General: Well developed, NAD, see BMI.  Neck: No  thyromegaly  HEENT:  Normocephalic . Face symmetric, atraumatic Lungs:  CTA B Normal respiratory  effort, no intercostal retractions, no accessory muscle use. Heart: RRR,  no murmur.  No pretibial edema bilaterally  Abdomen:  Not distended, soft, non-tender. No rebound or rigidity.   Skin: Exposed areas without rash. Not pale. Not jaundice Neurologic:  alert & oriented X3.  Speech normal, gait appropriate for age and unassisted Strength symmetric and appropriate for age.  Psych: Cognition and judgment appear intact.  Cooperative with normal attention span and concentration.  Behavior appropriate. No anxious or depressed appearing.     Assessment & Plan:   Assessment New pt 09-2015 Hyper glycemia A1c 6.0 (02-2017) HTN Dyslipidemia Elevated LFTs per chart review: AST slightly high, normal ALT CV: Dr Burt Knack  CAD dx 2005, was asx >> stent  H/o false  Positive stress-EKG Myoview 2013 negative.  stress echo normal 11-2014 Normal echo stress test 08-2017 H/o abnormal PAP, cone bx (remotely) H/o Fx distal fibula -R- 2013, no surgery  PLAN Diabetes: Concept of A1c discussed, checking A1c HTN, dyslipidemia, CAD: Continue present care, had a normal echo stress test 08-2017.  Asymptomatic RTC 1 year

## 2018-03-06 NOTE — Patient Instructions (Signed)
  GO TO THE FRONT DESK Schedule labs to be done next week, fasting  Schedule your next appointment for a physical exam in 1 year   Check the  blood pressure monthly Be sure your blood pressure is between 110/65 and  135/85. If it is consistently higher or lower, let me know  Please  call your gynecologist

## 2018-03-06 NOTE — Progress Notes (Signed)
Pre visit review using our clinic review tool, if applicable. No additional management support is needed unless otherwise documented below in the visit note. 

## 2018-03-06 NOTE — Assessment & Plan Note (Addendum)
-  Td 2018; pnm-23 2015; prevnar 03-2016;  zostavax 2011; shingrex  discussed (to get at her pharmacy) -Female care: saw Dr Nehemiah Settle 10-2015, was rec RTC PRN ; PAP 2013 wnl; rec to call gyn to be sure f/u is prn MMG 12/2017  --CCS: normal cscope 2015 -DEXA 01-2017 normal -Labs: Will come back fasting for a FLP, CBC, CMP, A1c -diet  and exercise discussed , she is doing very well.

## 2018-03-07 NOTE — Assessment & Plan Note (Signed)
Diabetes: Concept of A1c discussed, checking A1c HTN, dyslipidemia, CAD: Continue present care, had a normal echo stress test 08-2017.  Asymptomatic RTC 1 year

## 2018-03-10 ENCOUNTER — Other Ambulatory Visit (INDEPENDENT_AMBULATORY_CARE_PROVIDER_SITE_OTHER): Payer: Medicare HMO

## 2018-03-10 DIAGNOSIS — E785 Hyperlipidemia, unspecified: Secondary | ICD-10-CM | POA: Diagnosis not present

## 2018-03-10 DIAGNOSIS — R739 Hyperglycemia, unspecified: Secondary | ICD-10-CM | POA: Diagnosis not present

## 2018-03-10 DIAGNOSIS — Z0000 Encounter for general adult medical examination without abnormal findings: Secondary | ICD-10-CM | POA: Diagnosis not present

## 2018-03-10 LAB — COMPREHENSIVE METABOLIC PANEL
ALT: 28 U/L (ref 0–35)
AST: 40 U/L — ABNORMAL HIGH (ref 0–37)
Albumin: 4.6 g/dL (ref 3.5–5.2)
Alkaline Phosphatase: 76 U/L (ref 39–117)
BUN: 20 mg/dL (ref 6–23)
CALCIUM: 9.9 mg/dL (ref 8.4–10.5)
CHLORIDE: 102 meq/L (ref 96–112)
CO2: 31 meq/L (ref 19–32)
Creatinine, Ser: 0.97 mg/dL (ref 0.40–1.20)
GFR: 60.19 mL/min (ref >60.00)
GLUCOSE: 105 mg/dL — AB (ref 70–99)
POTASSIUM: 4.9 meq/L (ref 3.5–5.1)
Sodium: 138 mEq/L (ref 135–145)
Total Bilirubin: 1 mg/dL (ref 0.2–1.2)
Total Protein: 6.7 g/dL (ref 6.0–8.3)

## 2018-03-10 LAB — CBC WITH DIFFERENTIAL/PLATELET
BASOS ABS: 0 10*3/uL (ref 0.0–0.1)
BASOS PCT: 0.4 % (ref 0.0–3.0)
EOS PCT: 5.7 % — AB (ref 0.0–5.0)
Eosinophils Absolute: 0.3 10*3/uL (ref 0.0–0.7)
HEMATOCRIT: 40.5 % (ref 36.0–46.0)
Hemoglobin: 13.9 g/dL (ref 12.0–15.0)
LYMPHS ABS: 1.9 10*3/uL (ref 0.7–4.0)
Lymphocytes Relative: 36.1 % (ref 12.0–46.0)
MCHC: 34.3 g/dL (ref 30.0–36.0)
MCV: 89.9 fl (ref 78.0–100.0)
MONOS PCT: 10.7 % (ref 3.0–12.0)
Monocytes Absolute: 0.6 10*3/uL (ref 0.1–1.0)
NEUTROS ABS: 2.5 10*3/uL (ref 1.4–7.7)
NEUTROS PCT: 47.1 % (ref 43.0–77.0)
PLATELETS: 155 10*3/uL (ref 150.0–400.0)
RBC: 4.51 Mil/uL (ref 3.87–5.11)
RDW: 14.2 % (ref 11.5–15.5)
WBC: 5.3 10*3/uL (ref 4.0–10.5)

## 2018-03-10 LAB — HEMOGLOBIN A1C: Hgb A1c MFr Bld: 6 % (ref 4.6–6.5)

## 2018-03-10 LAB — LIPID PANEL
CHOL/HDL RATIO: 2
Cholesterol: 164 mg/dL (ref 0–200)
HDL: 65.5 mg/dL (ref >39.00)
LDL Cholesterol: 84 mg/dL (ref 0–99)
NONHDL: 98.24
Triglycerides: 70 mg/dL (ref 0.0–149.0)
VLDL: 14 mg/dL (ref 0.0–40.0)

## 2018-03-11 MED ORDER — ROSUVASTATIN CALCIUM 40 MG PO TABS: 40.0000 mg | ORAL_TABLET | Freq: Every day | ORAL | 0 refills | Status: DC

## 2018-03-11 NOTE — Addendum Note (Signed)
Addended byDamita Dunnings D on: 03/11/2018 04:42 PM   Modules accepted: Orders

## 2018-03-15 ENCOUNTER — Other Ambulatory Visit: Payer: Self-pay | Admitting: Internal Medicine

## 2018-03-23 ENCOUNTER — Ambulatory Visit: Payer: Medicare HMO | Admitting: Family Medicine

## 2018-03-23 ENCOUNTER — Encounter: Payer: Self-pay | Admitting: Family Medicine

## 2018-03-23 VITALS — BP 125/81 | HR 69 | Ht 64.0 in | Wt 150.0 lb

## 2018-03-23 DIAGNOSIS — M545 Low back pain, unspecified: Secondary | ICD-10-CM

## 2018-03-23 NOTE — Progress Notes (Signed)
PCP: Colon Branch, MD  Subjective:   HPI: Patient is a 72 y.o. female here for low back pain.  Patient reports she's having to do more around the house and with lifting because husband had back surgery recently. On Thursday of last week she was reaching up with her left arm to put get well cards up and felt a sharp pain in left side of low back. Since then she's getting sharp pains that are fleeting at 3/10 level currently though. Taking ibuprofen which helps. Can feel like she's going to fall down. Feels different than the SI joint problem she had previously. No radiation into legs. No numbness, tingling. No skin changes. No bowel/bladder dysfunction.  Past Medical History:  Diagnosis Date  . Ankle fracture, right 2013  . Coronary atherosclerosis of native coronary artery 2005   Taxus drug-eluting stent to treat severe stenosis in the proximal LAD  . HTN (hypertension)   . Mixed dyslipidemia   . Osteoarthritis     Current Outpatient Medications on File Prior to Visit  Medication Sig Dispense Refill  . aspirin 81 MG tablet Take 81 mg by mouth daily.      . Cholecalciferol (VITAMIN D-3 PO) Take 500 Units by mouth daily.     . fish oil-omega-3 fatty acids 1000 MG capsule Take 2 g by mouth daily.      Marland Kitchen losartan (COZAAR) 50 MG tablet Take 1 tablet (50 mg total) by mouth daily. 90 tablet 3  . Multiple Vitamin (MULTI VITAMIN) TABS Take 1 tablet by mouth daily.    . rosuvastatin (CRESTOR) 40 MG tablet Take 1 tablet (40 mg total) by mouth daily. 90 tablet 0   No current facility-administered medications on file prior to visit.     Past Surgical History:  Procedure Laterality Date  . CERVICAL CONE BIOPSY  1978   benign  . CORONARY STENT PLACEMENT  11/2003   LAD  . TUBAL LIGATION Bilateral 1980    No Known Allergies  Social History   Socioeconomic History  . Marital status: Married    Spouse name: Not on file  . Number of children: 1  . Years of education: Not on file   . Highest education level: Not on file  Occupational History  . 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 Financial resource strain: Not on file  . Food insecurity:    Worry: Not on file    Inability: Not on file  . Transportation needs:    Medical: Not on file    Non-medical: Not on file  Tobacco Use  . Smoking status: Former Smoker    Packs/day: 0.25    Years: 4.00    Pack years: 1.00    Types: Cigarettes    Start date: 08/26/1962    Last attempt to quit: 08/26/1966    Years since quitting: 51.6  . Smokeless tobacco: Never Used  Substance and Sexual Activity  . Alcohol use: Yes    Comment: glass of  wine daily.  . Drug use: No    Comment: marijuana -- very little yrs ago  . Sexual activity: Yes    Partners: Male    Birth control/protection: Surgical, Post-menopausal    Comment: tubal  Lifestyle  . Physical activity:    Days per week: Not on file    Minutes per session: Not on file  . Stress: Not on file  Relationships  . Social connections:    Talks on phone:  Not on file    Gets together: Not on file    Attends religious service: Not on file    Active member of club or organization: Not on file    Attends meetings of clubs or organizations: Not on file    Relationship status: Not on file  . Intimate partner violence:    Fear of current or ex partner: Not on file    Emotionally abused: Not on file    Physically abused: Not on file    Forced sexual activity: Not on file  Other Topics Concern  . Not on file  Social History Narrative   Household-- pt and husband (Mr [place])    Family History  Problem Relation Age of Onset  . Heart attack Father 60       second at age 3  . Lymphoma Mother        Lymphoma  . Heart disease Brother   . Heart attack Paternal Grandmother   . Stroke Paternal Grandmother   . Heart attack Paternal Grandfather   . Stroke Paternal Grandfather   . Colon cancer Neg Hx   . Breast cancer Neg Hx   .  Diabetes Neg Hx     BP 125/81   Pulse 69   Ht 5\' 4"  (1.626 m)   Wt 150 lb (68 kg)   BMI 25.75 kg/m   Review of Systems: See HPI above.     Objective:  Physical Exam:  Gen: NAD, comfortable in exam room  Back: No gross deformity, scoliosis. TTP low paraspinal lumbar region superior to SI joint.  No midline or bony TTP. FROM with pain on extension, flexion in area noted above. Strength LEs 5/5 all muscle groups.   2+ MSRs in patellar and achilles tendons, equal bilaterally. Negative SLRs. Sensation intact to light touch bilaterally.  Left hip: No deformity. FROM with 5/5 strength. No tenderness to palpation. NVI distally. Negative logroll Negative fabers and piriformis stretches.   Assessment & Plan:  1. Low back pain - consistent with strain of latissimus vs deep posture muscles of low back.  Start home exercises which were reviewed today.  Declined physical therapy for now as she's very busy trying to take care of her husband who just had back surgery.  Tylenol, ibuprofen.  Consider PT, prednisone dose pack, imaging if not improving.  F/u in 2 weeks.

## 2018-03-23 NOTE — Patient Instructions (Addendum)
You have a lumbar strain. Ok to take tylenol for baseline pain relief (1-2 extra strength tabs 3x/day) Ibuprofen 3 tabs three times a day with food for pain and inflammation for 7-10 days then as needed. Can consider prednisone dose pack, muscle relaxants, pain medication. Stay as active as possible. Do home exercises and stretches as directed - hold each for 20-30 seconds and do each one three times. Consider physical therapy, imaging if not improving. Follow up with me in 2 weeks.

## 2018-04-06 ENCOUNTER — Encounter: Payer: Self-pay | Admitting: Family Medicine

## 2018-04-06 ENCOUNTER — Ambulatory Visit (INDEPENDENT_AMBULATORY_CARE_PROVIDER_SITE_OTHER): Payer: Medicare HMO | Admitting: Family Medicine

## 2018-04-06 ENCOUNTER — Other Ambulatory Visit: Payer: Self-pay | Admitting: Internal Medicine

## 2018-04-06 VITALS — BP 144/86 | HR 60 | Ht 64.0 in | Wt 150.0 lb

## 2018-04-06 DIAGNOSIS — M545 Low back pain, unspecified: Secondary | ICD-10-CM

## 2018-04-06 NOTE — Progress Notes (Signed)
PCP: Colon Branch, MD  Subjective:   HPI: Patient is a 72 y.o. female here for low back pain.  7/29: Patient reports she's having to do more around the house and with lifting because husband had back surgery recently. On Thursday of last week she was reaching up with her left arm to put get well cards up and felt a sharp pain in left side of low back. Since then she's getting sharp pains that are fleeting at 3/10 level currently though. Taking ibuprofen which helps. Can feel like she's going to fall down. Feels different than the SI joint problem she had previously. No radiation into legs. No numbness, tingling. No skin changes. No bowel/bladder dysfunction.  8/12: Patient reports she's improved since last visit. Pain level currently 0/10. She reports she could be better with her home exercises and stretches. Taking ibuprofen 600mg  up to twice a day at present which helps. Pain still left side of low back. Doing silver sneakers at [facility]. No numbness, bowel/bladder dysfunction.  Past Medical History:  Diagnosis Date  . Ankle fracture, right 2013  . Coronary atherosclerosis of native coronary artery 2005   Taxus drug-eluting stent to treat severe stenosis in the proximal LAD  . HTN (hypertension)   . Mixed dyslipidemia   . Osteoarthritis     Current Outpatient Medications on File Prior to Visit  Medication Sig Dispense Refill  . aspirin 81 MG tablet Take 81 mg by mouth daily.      . Cholecalciferol (VITAMIN D-3 PO) Take 500 Units by mouth daily.     . fish oil-omega-3 fatty acids 1000 MG capsule Take 2 g by mouth daily.      Marland Kitchen losartan (COZAAR) 50 MG tablet Take 1 tablet (50 mg total) by mouth daily. 90 tablet 3  . Multiple Vitamin (MULTI VITAMIN) TABS Take 1 tablet by mouth daily.     No current facility-administered medications on file prior to visit.     Past Surgical History:  Procedure Laterality Date  . CERVICAL CONE BIOPSY  1978   benign  . CORONARY STENT  PLACEMENT  11/2003   LAD  . TUBAL LIGATION Bilateral 1980    No Known Allergies  Social History   Socioeconomic History  . Marital status: Married    Spouse name: Not on file  . Number of children: 1  . Years of education: Not on file  . Highest education level: Not on file  Occupational History  . 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 Financial resource strain: Not on file  . Food insecurity:    Worry: Not on file    Inability: Not on file  . Transportation needs:    Medical: Not on file    Non-medical: Not on file  Tobacco Use  . Smoking status: Former Smoker    Packs/day: 0.25    Years: 4.00    Pack years: 1.00    Types: Cigarettes    Start date: 08/26/1962    Last attempt to quit: 08/26/1966    Years since quitting: 51.6  . Smokeless tobacco: Never Used  Substance and Sexual Activity  . Alcohol use: Yes    Comment: glass of  wine daily.  . Drug use: No    Comment: marijuana -- very little yrs ago  . Sexual activity: Yes    Partners: Male    Birth control/protection: Surgical, Post-menopausal    Comment: tubal  Lifestyle  . Physical activity:  Days per week: Not on file    Minutes per session: Not on file  . Stress: Not on file  Relationships  . Social connections:    Talks on phone: Not on file    Gets together: Not on file    Attends religious service: Not on file    Active member of club or organization: Not on file    Attends meetings of clubs or organizations: Not on file    Relationship status: Not on file  . Intimate partner violence:    Fear of current or ex partner: Not on file    Emotionally abused: Not on file    Physically abused: Not on file    Forced sexual activity: Not on file  Other Topics Concern  . Not on file  Social History Narrative   Household-- pt and husband (Mr [place])    Family History  Problem Relation Age of Onset  . Heart attack Father 35       second at age 3  . Lymphoma Mother         Lymphoma  . Heart disease Brother   . Heart attack Paternal Grandmother   . Stroke Paternal Grandmother   . Heart attack Paternal Grandfather   . Stroke Paternal Grandfather   . Colon cancer Neg Hx   . Breast cancer Neg Hx   . Diabetes Neg Hx     BP (!) 144/86   Pulse 60   Ht 5\' 4"  (1.626 m)   Wt 150 lb (68 kg)   BMI 25.75 kg/m   Review of Systems: See HPI above.     Objective:  Physical Exam:  Gen: NAD, comfortable in exam room  Back: No gross deformity, scoliosis. No paraspinal TTP .  No midline or bony TTP. FROM without pain. Strength LEs 5/5 all muscle groups.   2+ MSRs in patellar and achilles tendons, equal bilaterally. Negative SLRs. Sensation intact to light touch bilaterally.  Left hip: No deformity. FROM with 5/5 strength. No tenderness to palpation. NVI distally. Negative logroll Mild pain with fabers.  Negative piriformis.   Assessment & Plan:  1. Low back pain - 2/2 strain latissimus vs deep posture muscles.  Some SI joint pain today.  Encouraged more regular home exercises and stretches.  Ibuprofen as needed but try to wean off this.  Tylenol if needed in addition to this.  Consider PT, prednisone if not improving.  F/u prn.

## 2018-04-06 NOTE — Patient Instructions (Signed)
Do home exercises and stretches until pain has resolved - then just do 2-3 times a week for 4 more weeks. Ibuprofen up to 3 times a day - try to wean off of this as pain improves. Follow up as needed if you're doing well. Have a good time in [place]!

## 2018-05-15 ENCOUNTER — Encounter: Payer: Self-pay | Admitting: Internal Medicine

## 2018-05-15 ENCOUNTER — Other Ambulatory Visit (INDEPENDENT_AMBULATORY_CARE_PROVIDER_SITE_OTHER): Payer: Medicare HMO

## 2018-05-15 DIAGNOSIS — E785 Hyperlipidemia, unspecified: Secondary | ICD-10-CM

## 2018-05-15 LAB — LIPID PANEL
Cholesterol: 129 mg/dL (ref 0–200)
HDL: 61.7 mg/dL (ref >39.00)
LDL Cholesterol: 51 mg/dL (ref 0–99)
NONHDL: 67.69
Total CHOL/HDL Ratio: 2
Triglycerides: 84 mg/dL (ref 0.0–149.0)
VLDL: 16.8 mg/dL (ref 0.0–40.0)

## 2018-05-15 LAB — ALT: ALT: 24 U/L (ref 0–35)

## 2018-05-15 LAB — AST: AST: 36 U/L (ref 0–37)

## 2018-05-19 MED ORDER — ROSUVASTATIN CALCIUM 20 MG PO TABS: 20.0000 mg | ORAL_TABLET | Freq: Every day | ORAL | 2 refills | Status: DC

## 2018-05-19 NOTE — Addendum Note (Signed)
Addended byDamita Dunnings D on: 05/19/2018 08:05 AM   Modules accepted: Orders

## 2018-06-05 ENCOUNTER — Other Ambulatory Visit: Payer: Self-pay | Admitting: Internal Medicine

## 2018-06-23 ENCOUNTER — Encounter: Payer: Self-pay | Admitting: Internal Medicine

## 2018-10-08 DIAGNOSIS — H524 Presbyopia: Secondary | ICD-10-CM | POA: Diagnosis not present

## 2018-10-21 ENCOUNTER — Ambulatory Visit: Payer: Medicare HMO | Admitting: Cardiovascular Disease

## 2018-10-21 ENCOUNTER — Encounter: Payer: Self-pay | Admitting: Cardiovascular Disease

## 2018-10-21 VITALS — BP 106/80 | HR 54 | Ht 64.0 in | Wt 150.1 lb

## 2018-10-21 DIAGNOSIS — E782 Mixed hyperlipidemia: Secondary | ICD-10-CM

## 2018-10-21 DIAGNOSIS — I10 Essential (primary) hypertension: Secondary | ICD-10-CM

## 2018-10-21 DIAGNOSIS — I2510 Atherosclerotic heart disease of native coronary artery without angina pectoris: Secondary | ICD-10-CM | POA: Diagnosis not present

## 2018-10-21 NOTE — Patient Instructions (Signed)
Medication Instructions:  Your provider recommends that you continue on your current medications as directed. Please refer to the Current Medication list given to you today.    Labwork: None  Testing/Procedures: None  Follow-Up: Your provider wants you to follow-up in: 2 years with Dr. Burt Knack. You will receive a reminder letter in the mail two months in advance. If you don't receive a letter, please call our office to schedule the follow-up appointment.

## 2018-10-21 NOTE — Progress Notes (Signed)
Cardiology Office Note:    Date:  10/21/2018   ID:  Kara Wheeler, DOB [birth year], MRN [id number]  PCP:  Colon Branch, MD  Cardiologist:  Sherren Mocha, MD  Electrophysiologist:  None   Referring MD: Colon Branch, MD   Chief Complaint  Patient presents with  . Coronary Artery Disease    History of Present Illness:    TIDA SANER is a 73 y.o. female with a hx of coronary artery disease. She underwent stenting of the LAD in 2005.  Her last stress test in 2016 was negative for ischemia.  She has always had a false positive EKG response to exercise.  She is been doing very well.  She stays active and denies any exertional symptoms.  She specifically denies chest pain or shortness of breath.  She is had no palpitations, leg swelling, orthopnea, or PND.  Past Medical History:  Diagnosis Date  . Ankle fracture, right 2013  . Coronary atherosclerosis of native coronary artery 2005   Taxus drug-eluting stent to treat severe stenosis in the proximal LAD  . HTN (hypertension)   . Mixed dyslipidemia   . Osteoarthritis     Past Surgical History:  Procedure Laterality Date  . CERVICAL CONE BIOPSY  1978   benign  . CORONARY STENT PLACEMENT  11/2003   LAD  . TUBAL LIGATION Bilateral 1980    Current Medications: Current Meds  Medication Sig  . aspirin 81 MG tablet Take 81 mg by mouth daily.    . Cholecalciferol (VITAMIN D-3 PO) Take 500 Units by mouth daily.   . fish oil-omega-3 fatty acids 1000 MG capsule Take 2 g by mouth daily.    Marland Kitchen losartan (COZAAR) 50 MG tablet Take 1 tablet (50 mg total) by mouth daily.  . Multiple Vitamin (MULTI VITAMIN) TABS Take 1 tablet by mouth daily.  . rosuvastatin (CRESTOR) 20 MG tablet Take 20 mg by mouth daily.     Allergies:   Patient has no known allergies.   Social History   Socioeconomic History  . Marital status: Married    Spouse name: Not on file  . Number of children: 1  . Years of education: Not on file  . Highest  education level: Not on file  Occupational History  . 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 Financial resource strain: Not on file  . Food insecurity:    Worry: Not on file    Inability: Not on file  . Transportation needs:    Medical: Not on file    Non-medical: Not on file  Tobacco Use  . Smoking status: Former Smoker    Packs/day: 0.25    Years: 4.00    Pack years: 1.00    Types: Cigarettes    Start date: 08/26/1962    Last attempt to quit: 08/26/1966    Years since quitting: 52.1  . Smokeless tobacco: Never Used  Substance and Sexual Activity  . Alcohol use: Yes    Comment: glass of  wine daily.  . Drug use: No    Comment: marijuana -- very little yrs ago  . Sexual activity: Yes    Partners: Male    Birth control/protection: Surgical, Post-menopausal    Comment: tubal  Lifestyle  . Physical activity:    Days per week: Not on file    Minutes per session: Not on file  . Stress: Not on file  Relationships  . Social connections:  Talks on phone: Not on file    Gets together: Not on file    Attends religious service: Not on file    Active member of club or organization: Not on file    Attends meetings of clubs or organizations: Not on file    Relationship status: Not on file  Other Topics Concern  . Not on file  Social History Narrative   Household-- pt and husband (Mr [place])     Family History: The patient's family history includes Heart attack in her paternal grandfather and paternal grandmother; Heart attack (age of onset: 72) in her father; Heart disease in her brother; Lymphoma in her mother; Stroke in her paternal grandfather and paternal grandmother. There is no history of Colon cancer, Breast cancer, or Diabetes.  ROS:   Please see the history of present illness.    All other systems reviewed and are negative.  EKGs/Labs/Other Studies Reviewed:    EKG:  EKG is ordered today.  The ekg ordered today demonstrates sinus  bradycardia 54 bpm, otherwise within normal limits.  Recent Labs: 03/10/2018: BUN 20; Creatinine, Ser 0.97; Hemoglobin 13.9; Platelets 155.0; Potassium 4.9; Sodium 138 05/15/2018: ALT 24  Recent Lipid Panel    Component Value Date/Time   CHOL 129 05/15/2018 0832   TRIG 84.0 05/15/2018 0832   HDL 61.70 05/15/2018 0832   CHOLHDL 2 05/15/2018 0832   VLDL 16.8 05/15/2018 0832   LDLCALC 51 05/15/2018 0832    Physical Exam:    VS:  BP 106/80   Pulse (!) 54   Ht 5\' 4"  (1.626 m)   Wt 150 lb 1.9 oz (68.1 kg)   SpO2 97%   BMI 25.77 kg/m     Wt Readings from Last 3 Encounters:  10/21/18 150 lb 1.9 oz (68.1 kg)  04/06/18 150 lb (68 kg)  03/23/18 150 lb (68 kg)     GEN:  Well nourished, well developed in no acute distress HEENT: Normal NECK: No JVD; No carotid bruits LYMPHATICS: No lymphadenopathy CARDIAC: bradycardic and regular, no murmurs, rubs, gallops RESPIRATORY:  Clear to auscultation without rales, wheezing or rhonchi  ABDOMEN: Soft, non-tender, non-distended MUSCULOSKELETAL:  No edema; No deformity  SKIN: Warm and dry NEUROLOGIC:  Alert and oriented x 3 PSYCHIATRIC:  Normal affect   ASSESSMENT:    1. Atherosclerosis of native coronary artery of native heart without angina pectoris   2. Essential hypertension   3. Mixed hyperlipidemia    PLAN:    In order of problems listed above:  1. The patient is doing well after remote PCI with stenting performed back in 2005.  She continues on aspirin, a statin drug, and losartan.  No changes are made today.  I will see her back in 2 years unless problems arise. 2. Blood pressure is well controlled on losartan.  Most recent labs reviewed. 3. Lipids are at goal with a cholesterol of 129, HDL 62, LDL 51.  She is treated with a statin drug.   Medication Adjustments/Labs and Tests Ordered: Current medicines are reviewed at length with the patient today.  Concerns regarding medicines are outlined above.  Orders Placed This  Encounter  Procedures  . EKG 12-Lead   No orders of the defined types were placed in this encounter.   Patient Instructions  Medication Instructions:  Your provider recommends that you continue on your current medications as directed. Please refer to the Current Medication list given to you today.    Labwork: None  Testing/Procedures: None  Follow-Up:  Your provider wants you to follow-up in: 2 years with Dr. Burt Knack. You will receive a reminder letter in the mail two months in advance. If you don't receive a letter, please call our office to schedule the follow-up appointment.      Signed, Sherren Mocha, MD  10/21/2018 3:53 PM    [place]

## 2018-10-26 ENCOUNTER — Telehealth: Payer: Self-pay | Admitting: Cardiovascular Disease

## 2018-10-26 NOTE — Telephone Encounter (Signed)
Dr. Burt Knack, this pt has remote stent in 2005, normal stress echo 08/2017. You are to see 2 years. Is it OK to hold aspirin for "muscle sample".   Please route response back to P CV DIV PREOP  Thanks, Gae Bon

## 2018-10-26 NOTE — Telephone Encounter (Signed)
New Message      [facility] Pre-operative Risk Assessment    Request for surgical clearance:  1. What type of surgery is being performed? Muscle sample  2. When is this surgery scheduled? 10/29/18  3. What type of clearance is required (medical clearance vs. Pharmacy clearance to hold med vs. Both)? Pharmacy clearanc  4. Are there any medications that need to be held prior to surgery and how long? Aspirin  5. Practice name and name of physician performing surgery? [facility] Dr. Nicolette Bang   6. What is your office phone number [phone]   7.   What is your office fax number [phone]  8.   Anesthesia type (None, local, MAC, general) ? Local   Andree Coss 10/26/2018, 11:26 AM  _________________________________________________________________   (provider comments below)

## 2018-10-26 NOTE — Telephone Encounter (Signed)
Yes this is ok. thanks 

## 2018-10-26 NOTE — Telephone Encounter (Signed)
   Primary Cardiologist: Sherren Mocha, MD  Chart reviewed as part of pre-operative protocol coverage.   Kara Wheeler was last seen on 10/21/2018 by Dr. Burt Knack.  Pt has remote stent in 2005, normal stress echo 08/2017. At her recent appointment she was doing very well, staying active with no exertional symptoms.   Therefore, based on ACC/AHA guidelines, the patient would be at acceptable risk for the planned procedure without further cardiovascular testing.   Per Dr. Burt Knack, it is OK to hold aspirin as needed for the procedure.   I will route this recommendation to the requesting party via Epic fax function and remove from pre-op pool.  Please call with questions.  Daune Perch, NP 10/26/2018, 3:42 PM

## 2018-10-28 ENCOUNTER — Telehealth: Payer: Self-pay | Admitting: Cardiovascular Disease

## 2018-10-28 NOTE — Telephone Encounter (Signed)
I will fix to requesting number via Epic.

## 2018-10-28 NOTE — Telephone Encounter (Signed)
Follow up    Kara Wheeler is calling to advise that fax has not been received for clearance. Did confirm that the fax number is [phone]. She request that it be re-faxed or the nurse can call and give verbal authorization.

## 2018-10-28 NOTE — Telephone Encounter (Signed)
New Message   Kara Wheeler states she sent over a clearance form 10/23/18 for patients aspirin and needs it faxed back to [phone].

## 2018-10-29 NOTE — Telephone Encounter (Signed)
Re-faxed clearance to number [phone].

## 2018-11-03 ENCOUNTER — Telehealth: Payer: Self-pay | Admitting: Cardiology

## 2018-11-03 NOTE — Telephone Encounter (Signed)
Kara Wheeler states she believes she spoke to you about this patient. She would like for you to give her a call.

## 2018-11-04 NOTE — Telephone Encounter (Signed)
Date/Time Type Contact Phone/Fax         11/03/2018 10:00 AM Phone (Incoming) [place], from [place]   By Ermelinda Das

## 2018-11-15 ENCOUNTER — Telehealth: Payer: Medicare HMO | Admitting: Nurse Practitioner

## 2018-11-15 DIAGNOSIS — R05 Cough: Secondary | ICD-10-CM

## 2018-11-15 DIAGNOSIS — R059 Cough, unspecified: Secondary | ICD-10-CM

## 2018-11-15 MED ORDER — AZITHROMYCIN 250 MG PO TABS: ORAL_TABLET | ORAL | 0 refills | Status: DC

## 2018-11-15 NOTE — Progress Notes (Signed)
We are sorry that you are not feeling well.  Here is how we plan to help!  Based on your presentation I believe you most likely have A cough due to bacteria.  When patients have a fever and a productive cough with a change in color or increased sputum production, we are concerned about bacterial bronchitis.  If left untreated it can progress to pneumonia.  If your symptoms do not improve with your treatment plan it is important that you contact your provider.   I have prescribed Azithromyin 250 mg: two tablets now and then one tablet daily for 4 additonal days    In addition you may use A non-prescription cough medication called Mucinex DM: take 2 tablets every 12 hours.  * I do not feel this is the corona virus but there is always a possibility. Currently they are only testing those that are sick enough to beuin hospital. continue to quarantine and take meds rx.  From your responses in the eVisit questionnaire you describe inflammation in the upper respiratory tract which is causing a significant cough.  This is commonly called Bronchitis and has four common causes:    Allergies  Viral Infections  Acid Reflux  Bacterial Infection Allergies, viruses and acid reflux are treated by controlling symptoms or eliminating the cause. An example might be a cough caused by taking certain blood pressure medications. You stop the cough by changing the medication. Another example might be a cough caused by acid reflux. Controlling the reflux helps control the cough.  USE OF BRONCHODILATOR ("RESCUE") INHALERS: There is a risk from using your bronchodilator too frequently.  The risk is that over-reliance on a medication which only relaxes the muscles surrounding the breathing tubes can reduce the effectiveness of medications prescribed to reduce swelling and congestion of the tubes themselves.  Although you feel brief relief from the bronchodilator inhaler, your asthma may actually be worsening with the tubes  becoming more swollen and filled with mucus.  This can delay other crucial treatments, such as oral steroid medications. If you need to use a bronchodilator inhaler daily, several times per day, you should discuss this with your provider.  There are probably better treatments that could be used to keep your asthma under control.     HOME CARE . Only take medications as instructed by your medical team. . Complete the entire course of an antibiotic. . Drink plenty of fluids and get plenty of rest. . Avoid close contacts especially the very young and the elderly . Cover your mouth if you cough or cough into your sleeve. . Always remember to wash your hands . A steam or ultrasonic humidifier can help congestion.   GET HELP RIGHT AWAY IF: . You develop worsening fever. . You become short of breath . You cough up blood. . Your symptoms persist after you have completed your treatment plan MAKE SURE YOU   Understand these instructions.  Will watch your condition.  Will get help right away if you are not doing well or get worse.  Your e-visit answers were reviewed by a board certified advanced clinical practitioner to complete your personal care plan.  Depending on the condition, your plan could have included both over the counter or prescription medications. If there is a problem please reply  once you have received a response from your provider. Your safety is important to [place].  If you have drug allergies check your prescription carefully.    You can use MyChart to ask  questions about today's visit, request a non-urgent call back, or ask for a work or school excuse for 24 hours related to this e-Visit. If it has been greater than 24 hours you will need to follow up with your provider, or enter a new e-Visit to address those concerns. You will get an e-mail in the next two days asking about your experience.  I hope that your e-visit has been valuable and will speed your recovery. Thank you for  using e-visits.  5 minutes spent reviewing and documenting in chart.

## 2018-12-31 DIAGNOSIS — Z20828 Contact with and (suspected) exposure to other viral communicable diseases: Secondary | ICD-10-CM | POA: Diagnosis not present

## 2019-01-08 ENCOUNTER — Telehealth: Payer: Self-pay

## 2019-01-08 DIAGNOSIS — B342 Coronavirus infection, unspecified: Secondary | ICD-10-CM

## 2019-01-08 NOTE — Telephone Encounter (Signed)
Pt requesting COVID-19 antibody testing. Okay per PCP. Order placed. Pt to call and schedule lab appt.

## 2019-01-12 ENCOUNTER — Other Ambulatory Visit (INDEPENDENT_AMBULATORY_CARE_PROVIDER_SITE_OTHER): Payer: Medicare HMO

## 2019-01-12 ENCOUNTER — Other Ambulatory Visit: Payer: Self-pay

## 2019-01-12 DIAGNOSIS — B342 Coronavirus infection, unspecified: Secondary | ICD-10-CM

## 2019-01-12 LAB — SAR COV2 SEROLOGY (COVID19)AB(IGG),IA: SARS CoV2 AB IGG: NEGATIVE

## 2019-02-13 ENCOUNTER — Other Ambulatory Visit: Payer: Self-pay | Admitting: Internal Medicine

## 2019-03-08 ENCOUNTER — Encounter: Payer: Self-pay | Admitting: Internal Medicine

## 2019-03-08 ENCOUNTER — Ambulatory Visit (INDEPENDENT_AMBULATORY_CARE_PROVIDER_SITE_OTHER): Payer: Medicare HMO | Admitting: Internal Medicine

## 2019-03-08 ENCOUNTER — Other Ambulatory Visit: Payer: Self-pay

## 2019-03-08 VITALS — BP 132/88 | HR 84 | Temp 98.0°F | Resp 16 | Ht 64.0 in | Wt 150.5 lb

## 2019-03-08 DIAGNOSIS — E785 Hyperlipidemia, unspecified: Secondary | ICD-10-CM

## 2019-03-08 DIAGNOSIS — Z0000 Encounter for general adult medical examination without abnormal findings: Secondary | ICD-10-CM | POA: Diagnosis not present

## 2019-03-08 DIAGNOSIS — R739 Hyperglycemia, unspecified: Secondary | ICD-10-CM | POA: Diagnosis not present

## 2019-03-08 LAB — COMPREHENSIVE METABOLIC PANEL
ALT: 29 U/L (ref 0–35)
AST: 39 U/L — ABNORMAL HIGH (ref 0–37)
Albumin: 4.6 g/dL (ref 3.5–5.2)
Alkaline Phosphatase: 70 U/L (ref 39–117)
BUN: 20 mg/dL (ref 6–23)
CO2: 29 mEq/L (ref 19–32)
Calcium: 10.3 mg/dL (ref 8.4–10.5)
Chloride: 103 mEq/L (ref 96–112)
Creatinine, Ser: 0.96 mg/dL (ref 0.40–1.20)
GFR: 57.15 mL/min — ABNORMAL LOW (ref >60.00)
Glucose, Bld: 91 mg/dL (ref 70–99)
Potassium: 4.8 mEq/L (ref 3.5–5.1)
Sodium: 140 mEq/L (ref 135–145)
Total Bilirubin: 1.2 mg/dL (ref 0.2–1.2)
Total Protein: 6.6 g/dL (ref 6.0–8.3)

## 2019-03-08 LAB — CBC WITH DIFFERENTIAL/PLATELET
Basophils Absolute: 0 10*3/uL (ref 0.0–0.1)
Basophils Relative: 0.4 % (ref 0.0–3.0)
Eosinophils Absolute: 0.2 10*3/uL (ref 0.0–0.7)
Eosinophils Relative: 3.6 % (ref 0.0–5.0)
HCT: 42.2 % (ref 36.0–46.0)
Hemoglobin: 14.2 g/dL (ref 12.0–15.0)
Lymphocytes Relative: 38.3 % (ref 12.0–46.0)
Lymphs Abs: 1.8 10*3/uL (ref 0.7–4.0)
MCHC: 33.6 g/dL (ref 30.0–36.0)
MCV: 91.8 fl (ref 78.0–100.0)
Monocytes Absolute: 0.4 10*3/uL (ref 0.1–1.0)
Monocytes Relative: 8.8 % (ref 3.0–12.0)
Neutro Abs: 2.3 10*3/uL (ref 1.4–7.7)
Neutrophils Relative %: 48.9 % (ref 43.0–77.0)
Platelets: 168 10*3/uL (ref 150.0–400.0)
RBC: 4.6 Mil/uL (ref 3.87–5.11)
RDW: 13.3 % (ref 11.5–15.5)
WBC: 4.7 10*3/uL (ref 4.0–10.5)

## 2019-03-08 LAB — LIPID PANEL
Cholesterol: 173 mg/dL (ref 0–200)
HDL: 65.5 mg/dL (ref >39.00)
LDL Cholesterol: 88 mg/dL (ref 0–99)
NonHDL: 107.15
Total CHOL/HDL Ratio: 3
Triglycerides: 95 mg/dL (ref 0.0–149.0)
VLDL: 19 mg/dL (ref 0.0–40.0)

## 2019-03-08 LAB — HEMOGLOBIN A1C: Hgb A1c MFr Bld: 6 % (ref 4.6–6.5)

## 2019-03-08 MED ORDER — ROSUVASTATIN CALCIUM 20 MG PO TABS: 20.0000 mg | ORAL_TABLET | Freq: Every day | ORAL | 3 refills | Status: DC

## 2019-03-08 MED ORDER — LOSARTAN POTASSIUM 50 MG PO TABS: 50.0000 mg | ORAL_TABLET | Freq: Every day | ORAL | 3 refills | Status: DC

## 2019-03-08 MED ORDER — SHINGRIX 50 MCG/0.5ML IM SUSR: 0.5000 mL | Freq: Once | INTRAMUSCULAR | 1 refills | Status: AC

## 2019-03-08 NOTE — Progress Notes (Signed)
Subjective:    Patient ID: Kara Wheeler, female    DOB: Jan 10, 1946, 73 y.o.   MRN: [id number]  DOS:  03/08/2019 Type of visit - description: CPX Doing well, no concerns. She remains active  Review of Systems Doing great with COVID-19 precautions, occasionally sad because she is unable to see her grandkids frequently.  Other than above, a 14 point review of systems is negative   Past Medical History:  Diagnosis Date  . Ankle fracture, right 2013  . Coronary atherosclerosis of native coronary artery 2005   Taxus drug-eluting stent to treat severe stenosis in the proximal LAD  . HTN (hypertension)   . Mixed dyslipidemia   . Osteoarthritis     Past Surgical History:  Procedure Laterality Date  . CERVICAL CONE BIOPSY  1978   benign  . CORONARY STENT PLACEMENT  11/2003   LAD  . TUBAL LIGATION Bilateral 1980    Social History   Socioeconomic History  . Marital status: Married    Spouse name: Not on file  . Number of children: 1  . Years of education: Not on file  . Highest education level: Not on file  Occupational History  . 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 Financial resource strain: Not on file  . Food insecurity    Worry: Not on file    Inability: Not on file  . Transportation needs    Medical: Not on file    Non-medical: Not on file  Tobacco Use  . Smoking status: Former Smoker    Packs/day: 0.25    Years: 4.00    Pack years: 1.00    Types: Cigarettes    Start date: 08/26/1962    Quit date: 08/26/1966    Years since quitting: 52.5  . Smokeless tobacco: Never Used  Substance and Sexual Activity  . Alcohol use: Yes    Comment: glass of  wine daily.  . Drug use: No    Comment: marijuana -- very little yrs ago  . Sexual activity: Yes    Partners: Male    Birth control/protection: Surgical, Post-menopausal    Comment: tubal  Lifestyle  . Physical activity    Days per week: Not on file    Minutes per session: Not  on file  . Stress: Not on file  Relationships  . Social Herbalist on phone: Not on file    Gets together: Not on file    Attends religious service: Not on file    Active member of club or organization: Not on file    Attends meetings of clubs or organizations: Not on file    Relationship status: Not on file  . Intimate partner violence    Fear of current or ex partner: Not on file    Emotionally abused: Not on file    Physically abused: Not on file    Forced sexual activity: Not on file  Other Topics Concern  . Not on file  Social History Narrative   Household-- pt and husband (Mr [place])     Family History  Problem Relation Age of Onset  . Heart attack Father 57       second at age 33  . Lymphoma Mother        Lymphoma  . Heart disease Brother   . Heart attack Paternal Grandmother   . Stroke Paternal Grandmother   . Heart attack Paternal Grandfather   . Stroke Paternal  Grandfather   . Colon cancer Neg Hx   . Breast cancer Neg Hx   . Diabetes Neg Hx      Allergies as of 03/08/2019   No Known Allergies     Medication List       Accurate as of March 08, 2019  8:47 PM. If you have any questions, ask your nurse or doctor.        STOP taking these medications   azithromycin 250 MG tablet Commonly known as: Zithromax Z-Pak Stopped by: Kathlene November, MD     TAKE these medications   aspirin 81 MG tablet Take 81 mg by mouth daily.   fish oil-omega-3 fatty acids 1000 MG capsule Take 2 g by mouth daily.   losartan 50 MG tablet Commonly known as: COZAAR Take 1 tablet (50 mg total) by mouth daily.   Multi Vitamin Tabs Take 1 tablet by mouth daily.   rosuvastatin 20 MG tablet Commonly known as: CRESTOR Take 1 tablet (20 mg total) by mouth daily.   Shingrix injection Generic drug: Zoster Vaccine Adjuvanted Inject 0.5 mLs into the muscle once for 1 dose. Started by: Kathlene November, MD   VITAMIN D-3 PO Take 500 Units by mouth daily.            Objective:   Physical Exam BP 132/88 (BP Location: Left Arm, Patient Position: Sitting, Cuff Size: Small)   Pulse 84   Temp 98 F (36.7 C) (Oral)   Resp 16   Ht 5\' 4"  (1.626 m)   Wt 150 lb 8 oz (68.3 kg)   SpO2 99%   BMI 25.83 kg/m  General: Well developed, NAD, BMI noted Neck: No  thyromegaly  HEENT:  Normocephalic . Face symmetric, atraumatic Lungs:  CTA B Normal respiratory effort, no intercostal retractions, no accessory muscle use. Heart: RRR,  no murmur.  No pretibial edema bilaterally  Abdomen:  Not distended, soft, non-tender. No rebound or rigidity.   Skin: Exposed areas without rash. Not pale. Not jaundice Neurologic:  alert & oriented X3.  Speech normal, gait appropriate for age and unassisted Strength symmetric and appropriate for age.  Psych: Cognition and judgment appear intact.  Cooperative with normal attention span and concentration.  Behavior appropriate. No anxious or depressed appearing.     Assessment     Assessment New pt 09-2015 Hyper glycemia A1c 6.0 (02-2017) HTN Dyslipidemia Elevated LFTs per chart review: AST slightly high, normal ALT CV: Dr Burt Knack  CAD dx 2005, was asx >> stent  H/o false  Positive stress-EKG Myoview 2013 negative.  stress echo normal 11-2014 Normal echo stress test 08-2017 H/o abnormal PAP, cone bx (remotely) H/o Fx distal fibula -R- 2013, no surgery  PLAN Hyperglycemia, HTN, dyslipidemia, CAD: Continue present care, checking labs, saw Dr. Burt Knack 09-2018, felt to be stable was recommended to follow-up in 2 years. RTC 1 year

## 2019-03-08 NOTE — Assessment & Plan Note (Signed)
Hyperglycemia, HTN, dyslipidemia, CAD: Continue present care, checking labs, saw Dr. Burt Knack 09-2018, felt to be stable was recommended to follow-up in 2 years. RTC 1 year

## 2019-03-08 NOTE — Assessment & Plan Note (Addendum)
-  Td 2018 - pnm-23 2015 - prevnar 03-2016 - zostavax 2011 - shingrex : unable to get it so far, rx printed   -Recommend early flu shot this year -Female care: saw Dr Nehemiah Settle 10-2015, was rec RTC PRN; rec to call gyn to be sure; MMG 12/2017 , plans to call and schedule  --CCS: normal cscope 2015 -DEXA 01-2017 normal.  On calcium and vitamin D supplements -Labs: CBC, CMP, FLP, A1c -diet  and exercise discussed , she is doing very well, walks daily

## 2019-03-08 NOTE — Patient Instructions (Signed)
GO TO THE LAB : Get the blood work     GO TO THE FRONT DESK Schedule your next appointment  For a physical in 1 year     Call your gynecologist   Check the  blood pressure 2   times a month   GOAL is between 110/65 and  135/85. If it is consistently higher or lower, let me know    Get a flu shot early

## 2019-03-08 NOTE — Progress Notes (Signed)
Pre visit review using our clinic review tool, if applicable. No additional management support is needed unless otherwise documented below in the visit note. 

## 2019-03-12 ENCOUNTER — Other Ambulatory Visit (HOSPITAL_BASED_OUTPATIENT_CLINIC_OR_DEPARTMENT_OTHER): Payer: Self-pay | Admitting: Internal Medicine

## 2019-03-12 DIAGNOSIS — Z1231 Encounter for screening mammogram for malignant neoplasm of breast: Secondary | ICD-10-CM

## 2019-03-18 ENCOUNTER — Other Ambulatory Visit: Payer: Self-pay | Admitting: Internal Medicine

## 2019-03-24 ENCOUNTER — Ambulatory Visit (HOSPITAL_BASED_OUTPATIENT_CLINIC_OR_DEPARTMENT_OTHER)
Admission: RE | Admit: 2019-03-24 | Discharge: 2019-03-24 | Disposition: A | Discharge status: Home / Self Care | Payer: Medicare HMO | Source: Ambulatory Visit | Attending: Internal Medicine | Admitting: Internal Medicine

## 2019-03-24 ENCOUNTER — Other Ambulatory Visit: Payer: Self-pay

## 2019-03-24 DIAGNOSIS — Z1231 Encounter for screening mammogram for malignant neoplasm of breast: Secondary | ICD-10-CM

## 2019-05-31 ENCOUNTER — Encounter: Payer: Self-pay | Admitting: Internal Medicine

## 2019-06-05 ENCOUNTER — Other Ambulatory Visit: Payer: Self-pay | Admitting: Internal Medicine

## 2019-07-30 ENCOUNTER — Encounter: Payer: Self-pay | Admitting: Internal Medicine

## 2019-10-15 DIAGNOSIS — H524 Presbyopia: Secondary | ICD-10-CM | POA: Diagnosis not present

## 2020-01-27 ENCOUNTER — Encounter: Payer: Self-pay | Admitting: Internal Medicine

## 2020-03-06 NOTE — Progress Notes (Signed)
I connected with Peg today by telephone and verified that I am speaking with the correct person using two identifiers. Location patient: home Location provider: work Persons participating in the virtual visit: patient, Marine scientist.    I discussed the limitations, risks, security and privacy concerns of performing an evaluation and management service by telephone and the availability of in person appointments. I also discussed with the patient that there may be a patient responsible charge related to this service. The patient expressed understanding and verbally consented to this telephonic visit.    Interactive audio and video telecommunications were attempted between this provider and patient, however failed, due to patient having technical difficulties OR patient did not have access to video capability.  We continued and completed visit with audio only.  Some vital signs may be absent or patient reported.   Subjective:   Kara Wheeler is a 74 y.o. female who presents for Medicare Annual (Subsequent) preventive examination.  Review of Systems     Cardiac Risk Factors include: advanced age (>2men, >68 women);hypertension;dyslipidemia     Objective:     Advanced Directives 03/07/2020 02/17/2018 01/28/2017 01/24/2016  Does Patient Have a Medical Advance Directive? Yes No No No  Type of Paramedic of [place];Living will - - -  Does patient want to make changes to medical advance directive? No - Patient declined - - -  Copy of [place] in Chart? No - copy requested - - -  Would patient like information on creating a medical advance directive? - Yes (MAU/Ambulatory/Procedural Areas - Information given) Yes (MAU/Ambulatory/Procedural Areas - Information given) No - patient declined information    Current Medications (verified) Outpatient Encounter Medications as of 03/07/2020  Medication Sig  . aspirin 81 MG tablet Take 81 mg by mouth daily.    .  Cholecalciferol (VITAMIN D-3 PO) Take 500 Units by mouth daily.   Marland Kitchen losartan (COZAAR) 25 MG tablet Take 2 tablets (50 mg total) by mouth daily.  . Multiple Vitamin (MULTI VITAMIN) TABS Take 1 tablet by mouth daily.  . rosuvastatin (CRESTOR) 20 MG tablet Take 1 tablet (20 mg total) by mouth daily.  . [DISCONTINUED] fish oil-omega-3 fatty acids 1000 MG capsule Take 2 g by mouth daily.     No facility-administered encounter medications on file as of 03/07/2020.    Allergies (verified) Patient has no known allergies.   History: Past Medical History:  Diagnosis Date  . Ankle fracture, right 2013  . Coronary atherosclerosis of native coronary artery 2005   Taxus drug-eluting stent to treat severe stenosis in the proximal LAD  . HTN (hypertension)   . Mixed dyslipidemia   . Osteoarthritis    Past Surgical History:  Procedure Laterality Date  . CERVICAL CONE BIOPSY  1978   benign  . CORONARY STENT PLACEMENT  11/2003   LAD  . TUBAL LIGATION Bilateral 1980   Family History  Problem Relation Age of Onset  . Heart attack Father 47       second at age 73  . Lymphoma Mother        Lymphoma  . Heart disease Brother   . Heart attack Paternal Grandmother   . Stroke Paternal Grandmother   . Heart attack Paternal Grandfather   . Stroke Paternal Grandfather   . Colon cancer Neg Hx   . Breast cancer Neg Hx   . Diabetes Neg Hx    Social History   Socioeconomic History  . Marital status: Married  Spouse name: Not on file  . Number of children: 1  . Years of education: Not on file  . Highest education level: Not on file  Occupational History  . 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 Smoking status: Former Smoker    Packs/day: 0.25    Years: 4.00    Pack years: 1.00    Types: Cigarettes    Start date: 08/26/1962    Quit date: 08/26/1966    Years since quitting: 53.5  . Smokeless tobacco: Never Used  Substance and Sexual Activity  . Alcohol use: Yes      Comment: glass of  wine daily.  . Drug use: No    Comment: marijuana -- very little yrs ago  . Sexual activity: Yes    Partners: Male    Birth control/protection: Surgical, Post-menopausal    Comment: tubal  Other Topics Concern  . Not on file  Social History Narrative   Household-- pt and husband (Mr Chenoweth)   Social Determinants of Health   Financial Resource Strain:   . Difficulty of Paying Living Expenses:   Food Insecurity:   . Worried About Charity fundraiser in the Last Year:   . Arboriculturist in the Last Year:   Transportation Needs: No Transportation Needs  . Lack of Transportation (Medical): No  . Lack of Transportation (Non-Medical): No  Physical Activity:   . Days of Exercise per Week:   . Minutes of Exercise per Session:   Stress:   . Feeling of Stress :   Social Connections:   . Frequency of Communication with Friends and Family:   . Frequency of Social Gatherings with Friends and Family:   . Attends Religious Services:   . Active Member of Clubs or Organizations:   . Attends Archivist Meetings:   Marland Kitchen Marital Status:     Tobacco Counseling Counseling given: Not Answered   Clinical Intake: Pain : No/denies pain    Activities of Daily Living In your present state of health, do you have any difficulty performing the following activities: 03/07/2020 03/08/2019  Hearing? N N  Vision? N N  Difficulty concentrating or making decisions? N N  Walking or climbing stairs? N N  Dressing or bathing? N N  Doing errands, shopping? N N  Preparing Food and eating ? N -  Using the Toilet? N -  In the past six months, have you accidently leaked urine? N -  Do you have problems with loss of bowel control? N -  Managing your Medications? N -  Managing your Finances? N -  Housekeeping or managing your Housekeeping? N -  Some recent data might be hidden    Patient Care Team: Colon Branch, MD as PCP - General (Internal Medicine) Sherren Mocha, MD  as PCP - Cardiology (Cardiology) Tommy Medal, MD as Consulting Physician (Internal Medicine) Wonda Horner, MD as Consulting Physician (Gastroenterology) Truett Mainland, DO as Consulting Physician (Gynecology) Moses Manners, OD as Consulting Physician (Optometry)  Indicate any recent Medical Services you may have received from other than [facility] providers in the past year (date may be approximate).     Assessment:   This is a routine wellness examination for [facility].  Dietary issues and exercise activities discussed: Current Exercise Habits: Structured exercise class, Type of exercise: yoga;strength training/weights;calisthenics, Time (Minutes): 60, Frequency (Times/Week): 6, Weekly Exercise (Minutes/Week): 360, Intensity: Moderate, Exercise limited by: None identified Diet (meal preparation, eat  out, water intake, caffeinated beverages, dairy products, fruits and vegetables): well balanced   Goals    .  Maintain current healthy lifestyle.    .  Weight (lb) < 140 lb (63.5 kg) (pt-stated)      Depression Screen PHQ 2/9 Scores 03/07/2020 03/08/2019 02/17/2018 01/28/2017 04/02/2016 01/15/2016 10/04/2015  PHQ - 2 Score 0 0 0 0 0 0 0    Fall Risk Fall Risk  03/07/2020 03/08/2019 02/17/2018 01/28/2017 04/02/2016  Falls in the past year? 0 0 No No No  Number falls in past yr: 0 0 - - -  Injury with Fall? 0 - - - -  Follow up Education provided;Falls prevention discussed - - - -   Lives w/ husband in 2 story home.  Any stairs in or around the home? Yes  If so, are there any without handrails? No  Home free of loose throw rugs in walkways, pet beds, electrical cords, etc? Yes  Adequate lighting in your home to reduce risk of falls? Yes    Cognitive Function: Ad8 score reviewed for issues:  Issues making decisions:no  Less interest in hobbies / activities:no  Repeats questions, stories (family complaining):no  Trouble using ordinary gadgets (microwave, computer, phone):no  Forgets the  month or year: no  Mismanaging finances: no  Remembering appts:no  Daily problems with thinking and/or memory:no Ad8 score is=0     MMSE - Mini Mental State Exam 01/28/2017  Orientation to time 5  Orientation to Place 5  Registration 3  Attention/ Calculation 5  Recall 3  Language- name 2 objects 2  Language- repeat 1  Language- follow 3 step command 3  Language- read & follow direction 1  Write a sentence 1  Copy design 1  Total score 30        Immunizations Immunization History  Administered Date(s) Administered  . Influenza Split 05/15/2018  . Influenza, High Dose Seasonal PF 05/27/2019  . Influenza-Unspecified 05/26/2015, 05/26/2016, 06/04/2017  . Moderna SARS-COVID-2 Vaccination 09/26/2019, 10/25/2019  . Pneumococcal Conjugate-13 04/02/2016  . Pneumococcal Polysaccharide-23 08/17/2014  . Td 03/05/2017  . Tdap 04/21/2007  . Zoster 08/26/2009  . Zoster Recombinat (Shingrix) 03/18/2019, 07/17/2019    TDAP status: Up to date Flu Vaccine status: Up to date Pneumococcal vaccine status: Up to date Covid-19 vaccine status: Completed vaccines  Qualifies for Shingles Vaccine? Yes   Zostavax completed Yes   Shingrix Completed?: Yes  Screening Tests Health Maintenance  Topic Date Due  . MAMMOGRAM  03/23/2020  . INFLUENZA VACCINE  03/26/2020  . COLONOSCOPY  10/08/2023  . TETANUS/TDAP  03/06/2027  . DEXA SCAN  Completed  . COVID-19 Vaccine  Completed  . Hepatitis C Screening  Completed  . PNA vac Low Risk Adult  Completed    Health Maintenance  There are no preventive care reminders to display for this patient.  Colorectal cancer screening: Completed 10/07/13. Repeat every 10 years Mammogram status: Completed 03/24/19. Repeat every year Bone Density status: Completed 02/03/17. Results reflect: Bone density results: NORMAL. Repeat every 2 years.  Lung Cancer Screening: (Low Dose CT Chest recommended if Age 17-80 years, 30 pack-year currently smoking OR have  quit w/in 15years.) does not qualify.    Additional Screening:  Hepatitis C Screening: does qualify; Completed 10/14/16  Vision Screening: Recommended annual ophthalmology exams for early detection of glaucoma and other disorders of the eye. Is the patient up to date with their annual eye exam?  Yes  Who is the provider or what is the name  of the office in which the patient attends annual eye exams? Dr.Daughtry  Dental Screening: Recommended annual dental exams for proper oral hygiene  Community Resource Referral / Chronic Care Management: CRR required this visit?  No   CCM required this visit?  No      Plan:    Please schedule your next medicare wellness visit with me in 1 yr.  Keep up the great work!  I have personally reviewed and noted the following in the patient's chart:   . Medical and social history . Use of alcohol, tobacco or illicit drugs  . Current medications and supplements . Functional ability and status . Nutritional status . Physical activity . Advanced directives . List of other physicians . Hospitalizations, surgeries, and ER visits in previous 12 months . Vitals . Screenings to include cognitive, depression, and falls . Referrals and appointments  In addition, I have reviewed and discussed with patient certain preventive protocols, quality metrics, and best practice recommendations. A written personalized care plan for preventive services as well as general preventive health recommendations were provided to patient.   Due to this being a telephonic visit, the after visit summary with patients personalized plan was offered to patient via mail or my-chart. Patient to pick up at office at next visit.  Shela Nevin, [place]   03/07/2020   Nurse Notes: pt really enjoys working out and spending time with her grandkids whenever she can.

## 2020-03-07 ENCOUNTER — Ambulatory Visit (INDEPENDENT_AMBULATORY_CARE_PROVIDER_SITE_OTHER): Payer: Medicare HMO | Admitting: *Deleted

## 2020-03-07 ENCOUNTER — Encounter: Payer: Self-pay | Admitting: *Deleted

## 2020-03-07 ENCOUNTER — Other Ambulatory Visit: Payer: Self-pay

## 2020-03-07 DIAGNOSIS — Z0000 Encounter for general adult medical examination without abnormal findings: Secondary | ICD-10-CM

## 2020-03-07 NOTE — Patient Instructions (Signed)
Kara Wheeler , Thank you for taking time to come for your Medicare Wellness Visit. I appreciate your ongoing commitment to your health goals. Please review the following plan we discussed and let me know if I can assist you in the future.   Screening recommendations/referrals: Colorectal cancer screening: Completed 10/07/13. Repeat every 10 years Mammogram status: Completed 03/24/19. Repeat every year Bone Density status: Completed 02/03/17. Results reflect: Bone density results: NORMAL. Repeat every 2 years. Recommended yearly ophthalmology/optometry visit for glaucoma screening and checkup Recommended yearly dental visit for hygiene and checkup  Vaccinations: TDAP status: Up to date Flu Vaccine status: Up to date Pneumococcal vaccine status: Up to date Covid-19 vaccine status: Completed vaccines  Advanced directives: Bring a copy of your living will and/or healthcare power of attorney to your next office visit.  Next appointment: Follow up in one year for your annual wellness visit    Preventive Care 65 Years and Older, Female Preventive care refers to lifestyle choices and visits with your health care provider that can promote health and wellness. What does preventive care include?  A yearly physical exam. This is also called an annual well check.  Dental exams once or twice a year.  Routine eye exams. Ask your health care provider how often you should have your eyes checked.  Personal lifestyle choices, including:  Daily care of your teeth and gums.  Regular physical activity.  Eating a healthy diet.  Avoiding tobacco and drug use.  Limiting alcohol use.  Practicing safe sex.  Taking low-dose aspirin every day.  Taking vitamin and mineral supplements as recommended by your health care provider. What happens during an annual well check? The services and screenings done by your health care provider during your annual well check will depend on your age, overall health,  lifestyle risk factors, and family history of disease. Counseling  Your health care provider may ask you questions about your:  Alcohol use.  Tobacco use.  Drug use.  Emotional well-being.  Home and relationship well-being.  Sexual activity.  Eating habits.  History of falls.  Memory and ability to understand (cognition).  Work and work Statistician.  Reproductive health. Screening  You may have the following tests or measurements:  Height, weight, and BMI.  Blood pressure.  Lipid and cholesterol levels. These may be checked every 5 years, or more frequently if you are over 37 years old.  Skin check.  Lung cancer screening. You may have this screening every year starting at age 49 if you have a 30-pack-year history of smoking and currently smoke or have quit within the past 15 years.  Fecal occult blood test (FOBT) of the stool. You may have this test every year starting at age 73.  Flexible sigmoidoscopy or colonoscopy. You may have a sigmoidoscopy every 5 years or a colonoscopy every 10 years starting at age 57.  Hepatitis C blood test.  Hepatitis B blood test.  Sexually transmitted disease (STD) testing.  Diabetes screening. This is done by checking your blood sugar (glucose) after you have not eaten for a while (fasting). You may have this done every 1-3 years.  Bone density scan. This is done to screen for osteoporosis. You may have this done starting at age 83.  Mammogram. This may be done every 1-2 years. Talk to your health care provider about how often you should have regular mammograms. Talk with your health care provider about your test results, treatment options, and if necessary, the need for more tests. Vaccines  Your health care provider may recommend certain vaccines, such as:  Influenza vaccine. This is recommended every year.  Tetanus, diphtheria, and acellular pertussis (Tdap, Td) vaccine. You may need a Td booster every 10 years.  Zoster  vaccine. You may need this after age 66.  Pneumococcal 13-valent conjugate (PCV13) vaccine. One dose is recommended after age 21.  Pneumococcal polysaccharide (PPSV23) vaccine. One dose is recommended after age 70. Talk to your health care provider about which screenings and vaccines you need and how often you need them. This information is not intended to replace advice given to you by your health care provider. Make sure you discuss any questions you have with your health care provider. Document Released: 09/08/2015 Document Revised: 05/01/2016 Document Reviewed: 06/13/2015 Elsevier Interactive Patient Education  2017 [place] Prevention in the Home Falls can cause injuries. They can happen to people of all ages. There are many things you can do to make your home safe and to help prevent falls. What can I do on the outside of my home?  Regularly fix the edges of walkways and driveways and fix any cracks.  Remove anything that might make you trip as you walk through a door, such as a raised step or threshold.  Trim any bushes or trees on the path to your home.  Use bright outdoor lighting.  Clear any walking paths of anything that might make someone trip, such as rocks or tools.  Regularly check to see if handrails are loose or broken. Make sure that both sides of any steps have handrails.  Any raised decks and porches should have guardrails on the edges.  Have any leaves, snow, or ice cleared regularly.  Use sand or salt on walking paths during winter.  Clean up any spills in your garage right away. This includes oil or grease spills. What can I do in the bathroom?  Use night lights.  Install grab bars by the toilet and in the tub and shower. Do not use towel bars as grab bars.  Use non-skid mats or decals in the tub or shower.  If you need to sit down in the shower, use a plastic, non-slip stool.  Keep the floor dry. Clean up any water that spills on the  floor as soon as it happens.  Remove soap buildup in the tub or shower regularly.  Attach bath mats securely with double-sided non-slip rug tape.  Do not have throw rugs and other things on the floor that can make you trip. What can I do in the bedroom?  Use night lights.  Make sure that you have a light by your bed that is easy to reach.  Do not use any sheets or blankets that are too big for your bed. They should not hang down onto the floor.  Have a firm chair that has side arms. You can use this for support while you get dressed.  Do not have throw rugs and other things on the floor that can make you trip. What can I do in the kitchen?  Clean up any spills right away.  Avoid walking on wet floors.  Keep items that you use a lot in easy-to-reach places.  If you need to reach something above you, use a strong step stool that has a grab bar.  Keep electrical cords out of the way.  Do not use floor polish or wax that makes floors slippery. If you must use wax, use non-skid floor wax.  Do  not have throw rugs and other things on the floor that can make you trip. What can I do with my stairs?  Do not leave any items on the stairs.  Make sure that there are handrails on both sides of the stairs and use them. Fix handrails that are broken or loose. Make sure that handrails are as long as the stairways.  Check any carpeting to make sure that it is firmly attached to the stairs. Fix any carpet that is loose or worn.  Avoid having throw rugs at the top or bottom of the stairs. If you do have throw rugs, attach them to the floor with carpet tape.  Make sure that you have a light switch at the top of the stairs and the bottom of the stairs. If you do not have them, ask someone to add them for you. What else can I do to help prevent falls?  Wear shoes that:  Do not have high heels.  Have rubber bottoms.  Are comfortable and fit you well.  Are closed at the toe. Do not wear  sandals.  If you use a stepladder:  Make sure that it is fully opened. Do not climb a closed stepladder.  Make sure that both sides of the stepladder are locked into place.  Ask someone to hold it for you, if possible.  Clearly mark and make sure that you can see:  Any grab bars or handrails.  First and last steps.  Where the edge of each step is.  Use tools that help you move around (mobility aids) if they are needed. These include:  Canes.  Walkers.  Scooters.  Crutches.  Turn on the lights when you go into a dark area. Replace any light bulbs as soon as they burn out.  Set up your furniture so you have a clear path. Avoid moving your furniture around.  If any of your floors are uneven, fix them.  If there are any pets around you, be aware of where they are.  Review your medicines with your doctor. Some medicines can make you feel dizzy. This can increase your chance of falling. Ask your doctor what other things that you can do to help prevent falls. This information is not intended to replace advice given to you by your health care provider. Make sure you discuss any questions you have with your health care provider. Document Released: 06/08/2009 Document Revised: 01/18/2016 Document Reviewed: 09/16/2014 Elsevier Interactive Patient Education  2017 [employer].

## 2020-03-11 ENCOUNTER — Other Ambulatory Visit: Payer: Self-pay | Admitting: Internal Medicine

## 2020-03-13 ENCOUNTER — Ambulatory Visit (INDEPENDENT_AMBULATORY_CARE_PROVIDER_SITE_OTHER): Payer: Medicare HMO | Admitting: Internal Medicine

## 2020-03-13 ENCOUNTER — Encounter: Payer: Self-pay | Admitting: Internal Medicine

## 2020-03-13 ENCOUNTER — Other Ambulatory Visit: Payer: Self-pay

## 2020-03-13 VITALS — BP 117/80 | HR 67 | Temp 97.8°F | Resp 18 | Ht 64.0 in | Wt 153.1 lb

## 2020-03-13 DIAGNOSIS — R739 Hyperglycemia, unspecified: Secondary | ICD-10-CM | POA: Diagnosis not present

## 2020-03-13 DIAGNOSIS — E785 Hyperlipidemia, unspecified: Secondary | ICD-10-CM

## 2020-03-13 DIAGNOSIS — Z0000 Encounter for general adult medical examination without abnormal findings: Secondary | ICD-10-CM

## 2020-03-13 LAB — COMPREHENSIVE METABOLIC PANEL
ALT: 27 U/L (ref 0–35)
AST: 37 U/L (ref 0–37)
Albumin: 4.4 g/dL (ref 3.5–5.2)
Alkaline Phosphatase: 73 U/L (ref 39–117)
BUN: 22 mg/dL (ref 6–23)
CO2: 31 mEq/L (ref 19–32)
Calcium: 10.1 mg/dL (ref 8.4–10.5)
Chloride: 105 mEq/L (ref 96–112)
Creatinine, Ser: 1.12 mg/dL (ref 0.40–1.20)
GFR: 47.7 mL/min — ABNORMAL LOW (ref >60.00)
Glucose, Bld: 73 mg/dL (ref 70–99)
Potassium: 5 mEq/L (ref 3.5–5.1)
Sodium: 141 mEq/L (ref 135–145)
Total Bilirubin: 0.9 mg/dL (ref 0.2–1.2)
Total Protein: 6.5 g/dL (ref 6.0–8.3)

## 2020-03-13 LAB — CBC WITH DIFFERENTIAL/PLATELET
Basophils Absolute: 0 10*3/uL (ref 0.0–0.1)
Basophils Relative: 0.5 % (ref 0.0–3.0)
Eosinophils Absolute: 0.3 10*3/uL (ref 0.0–0.7)
Eosinophils Relative: 5.1 % — ABNORMAL HIGH (ref 0.0–5.0)
HCT: 39.8 % (ref 36.0–46.0)
Hemoglobin: 13.5 g/dL (ref 12.0–15.0)
Lymphocytes Relative: 38.4 % (ref 12.0–46.0)
Lymphs Abs: 1.9 10*3/uL (ref 0.7–4.0)
MCHC: 33.9 g/dL (ref 30.0–36.0)
MCV: 91.8 fl (ref 78.0–100.0)
Monocytes Absolute: 0.6 10*3/uL (ref 0.1–1.0)
Monocytes Relative: 12 % (ref 3.0–12.0)
Neutro Abs: 2.2 10*3/uL (ref 1.4–7.7)
Neutrophils Relative %: 44 % (ref 43.0–77.0)
Platelets: 168 10*3/uL (ref 150.0–400.0)
RBC: 4.34 Mil/uL (ref 3.87–5.11)
RDW: 14.6 % (ref 11.5–15.5)
WBC: 4.9 10*3/uL (ref 4.0–10.5)

## 2020-03-13 LAB — LIPID PANEL
Cholesterol: 159 mg/dL (ref 0–200)
HDL: 68.2 mg/dL (ref >39.00)
LDL Cholesterol: 77 mg/dL (ref 0–99)
NonHDL: 90.64
Total CHOL/HDL Ratio: 2
Triglycerides: 70 mg/dL (ref 0.0–149.0)
VLDL: 14 mg/dL (ref 0.0–40.0)

## 2020-03-13 LAB — HEMOGLOBIN A1C: Hgb A1c MFr Bld: 5.8 % (ref 4.6–6.5)

## 2020-03-13 NOTE — Patient Instructions (Addendum)
Check the  blood pressure regulalrly  BP GOAL is between 110/65 and  135/85. If it is consistently higher or lower, let me know   GO TO THE LAB : Get the blood work     [place], [place] back for a physical exam in 1 year     LEARN ABOUT CALCIUM AND VITAMIN D  Calcium and Vitamin D are important to help keep your bones healthy and prevent osteoporosis. Healthy calcium and Vitamin D levels can be reached by increasing calcium and vitamin D in your diet, or by taking supplements over the counter.  The current recommendations are as follows:  Increasing calcium and vitamin D is generally well tolerated in most people. Some people may get constipated (with CALCIUM).   RECCOMENDATIONS -Postmenopausal women:    1200mg  calcium and 800 IU (20 micrograms) vitamin D daily  -Premenopausal women:    1000mg  calcium and 600 IU (15 micrograms) Vitamin D daily   -Men (<70) with osteoporosis:   1000mg  calcium and 600 IU (15 micrograms) Vitamin D daily  -Men over 70:   800 IU Vitamin D daily  Below are some examples of foods high in calcium and vitamin D:  Foods with Vitamin D FOOD Vitamin D Amount  3 oz Salmon 380-570 IU  8 oz Milk (nonfat, reduced fat, and whole) 100 IU  6 oz Yogurt, fortified with vitamin D 80 IU  8 oz Orange Juice, fortified with vitamin D 100 IU  1 Egg 25 IU   Foods with Calcium FOOD Calcium Amount  1 cup of milk 300 mg  1 cup of yogurt 450 mg  1 oz cheese 200 mg  1 cup of spinach 240 mg  8 oz orange juice, fortified with calcium 300 mg

## 2020-03-13 NOTE — Assessment & Plan Note (Signed)
-  Td 2018 - pnm-23 2015 - prevnar 03-2016 - zostavax 2011 - s/p shingrex - s/p covid vaccines -rec flu shot q year -Female care: saw Dr Nehemiah Settle 10-2015, was rec RTC PRN  MMG 02/2019, pt will call for an appointment --CCS: normal cscope 2015 -DEXA 01-2017 normal.  On calcium and vitamin D supplements -Labs: CMP, FLP, CBC, A1c. -diet  and exercise discussed , she is doing very well, walks daily

## 2020-03-13 NOTE — Assessment & Plan Note (Signed)
Here for CPX Hyperglycemia check A1c, she has a very good lifestyle HTN: Excellent BP, on losartan. High cholesterol: on Crestor DJD: Aches and pains as described above, no red flag symptoms, likely DJD.  Recommend to continue her routine exercises. CAD: On aspirin, crestor, ARBs; asx. Varicose veins: Extensive discussion, she knows there are surgical treatments available but definitely prefers conservative approach.  Recommend to avoid injuries and use compression stockings. RTC 1 year

## 2020-03-13 NOTE — Progress Notes (Signed)
Pre visit review using our clinic review tool, if applicable. No additional management support is needed unless otherwise documented below in the visit note. 

## 2020-03-13 NOTE — Progress Notes (Signed)
Subjective:    Patient ID: Kara Wheeler, female    DOB: 25-May-1946, 74 y.o.   MRN: [id number]  DOS:  03/13/2020 Type of visit - description: cpx  Feeling well, no major concerns. Has noted stiffness and some aches at the right neck, left buttock mostly when she wakes up, symptoms go away once she starts moving/walking. Has not required medications for the aches. No obvious synovitis on self examination of the hands and wrists.  Has also noticed varicose veins, no bleeding  Review of Systems  Other than above, a 14 point review of systems is negative     Past Medical History:  Diagnosis Date  . Ankle fracture, right 2013  . Coronary atherosclerosis of native coronary artery 2005   Taxus drug-eluting stent to treat severe stenosis in the proximal LAD  . HTN (hypertension)   . Mixed dyslipidemia   . Osteoarthritis     Past Surgical History:  Procedure Laterality Date  . CERVICAL CONE BIOPSY  1978   benign  . CORONARY STENT PLACEMENT  11/2003   LAD  . TUBAL LIGATION Bilateral 1980    Allergies as of 03/13/2020   No Known Allergies     Medication List       Accurate as of March 13, 2020 10:27 PM. If you have any questions, ask your nurse or doctor.        aspirin 81 MG tablet Take 81 mg by mouth daily.   losartan 50 MG tablet Commonly known as: COZAAR Take 1 tablet (50 mg total) by mouth daily. What changed: Another medication with the same name was removed. Continue taking this medication, and follow the directions you see here. Changed by: Kathlene November, MD   Multi Vitamin Tabs Take 1 tablet by mouth daily.   rosuvastatin 20 MG tablet Commonly known as: CRESTOR Take 1 tablet (20 mg total) by mouth daily.   VITAMIN D-3 PO Take 500 Units by mouth daily.          Objective:   Physical Exam BP 117/80 (BP Location: Left Arm, Patient Position: Sitting, Cuff Size: Small)   Pulse 67   Temp 97.8 F (36.6 C) (Oral)   Resp 18   Ht 5\' 4"  (1.626 m)    Wt 153 lb 2 oz (69.5 kg)   SpO2 97%   BMI 26.28 kg/m  General: Well developed, NAD, BMI noted Neck: No  thyromegaly  HEENT:  Normocephalic . Face symmetric, atraumatic Lungs:  CTA B Normal respiratory effort, no intercostal retractions, no accessory muscle use. Heart: RRR,  no murmur.  Abdomen:  Not distended, soft, non-tender. No rebound or rigidity.   Lower extremities: no pretibial edema bilaterally MSK: Hands and wrists with no synovitis Skin: Varicose veins noted at the pretibial area bilaterally with no redness or warmness.  Some hyperpigmentation Neurologic:  alert & oriented X3.  Speech normal, gait appropriate for age and unassisted Strength symmetric and appropriate for age.  Psych: Cognition and judgment appear intact.  Cooperative with normal attention span and concentration.  Behavior appropriate. No anxious or depressed appearing.     Assessment     Assessment New pt 09-2015 Hyper glycemia A1c 6.0 (02-2017) HTN Dyslipidemia Elevated LFTs per chart review: AST slightly high, normal ALT CV: Dr Burt Knack  CAD dx 2005, was asx >> stent  H/o false  Positive stress-EKG Myoview 2013 negative.  stress echo normal 11-2014 Normal echo stress test 08-2017 H/o abnormal PAP, cone bx (remotely) H/o Fx distal fibula -  R- 2013, no surgery  PLAN Here for CPX Hyperglycemia check A1c, she has a very good lifestyle HTN: Excellent BP, on losartan. High cholesterol: on Crestor DJD: Aches and pains as described above, no red flag symptoms, likely DJD.  Recommend to continue her routine exercises. CAD: On aspirin, crestor, ARBs; asx. Varicose veins: Extensive discussion, she knows there are surgical treatments available but definitely prefers conservative approach.  Recommend to avoid injuries and use compression stockings. RTC 1 year    This visit occurred during the SARS-CoV-2 public health emergency.  Safety protocols were in place, including screening questions prior to the  visit, additional usage of staff PPE, and extensive cleaning of exam room while observing appropriate contact time as indicated for disinfecting solutions.

## 2020-04-21 ENCOUNTER — Other Ambulatory Visit (HOSPITAL_BASED_OUTPATIENT_CLINIC_OR_DEPARTMENT_OTHER): Payer: Self-pay | Admitting: Internal Medicine

## 2020-04-21 DIAGNOSIS — Z1231 Encounter for screening mammogram for malignant neoplasm of breast: Secondary | ICD-10-CM

## 2020-04-28 ENCOUNTER — Ambulatory Visit: Payer: Medicare HMO | Admitting: Family Medicine

## 2020-04-28 ENCOUNTER — Ambulatory Visit: Payer: Self-pay

## 2020-04-28 ENCOUNTER — Ambulatory Visit (HOSPITAL_BASED_OUTPATIENT_CLINIC_OR_DEPARTMENT_OTHER)
Admission: RE | Admit: 2020-04-28 | Discharge: 2020-04-28 | Disposition: A | Discharge status: Home / Self Care | Payer: Medicare HMO | Source: Ambulatory Visit | Attending: Internal Medicine | Admitting: Internal Medicine

## 2020-04-28 ENCOUNTER — Other Ambulatory Visit: Payer: Self-pay

## 2020-04-28 VITALS — BP 140/88 | Ht 64.0 in | Wt 150.0 lb

## 2020-04-28 DIAGNOSIS — M25552 Pain in left hip: Secondary | ICD-10-CM

## 2020-04-28 DIAGNOSIS — Z1231 Encounter for screening mammogram for malignant neoplasm of breast: Secondary | ICD-10-CM

## 2020-04-28 MED ORDER — METHYLPREDNISOLONE ACETATE 40 MG/ML IJ SUSP
40.0000 mg | Freq: Once | INTRAMUSCULAR | Status: AC
  Administered 2020-04-28: 40 mg via INTRA_ARTICULAR

## 2020-04-28 NOTE — Patient Instructions (Addendum)
Nice to meet you Please try ice  Please try the exercises  Please try voltaren over the counter   Please send me a message in MyChart with any questions or updates.  Please see me back in 4 weeks.   --Dr. Raeford Razor

## 2020-04-28 NOTE — Progress Notes (Signed)
Kara Wheeler - 74 y.o. female MRN [id number]  Date of birth: 06-11-1946  SUBJECTIVE:  Including CC & ROS.  No chief complaint on file.   Kara Wheeler is a 74 y.o. female that is presenting with left hip pain.  Has been ongoing for about 6 months.  Denies any radicular symptoms.  She does walk for exercise.  She does not notice it with walking.  Her pain seems to happen when she is lying down and sleeping at night.  No history of surgery.  No history of similar pain.   Review of Systems See HPI   HISTORY: Past Medical, Surgical, Social, and Family History Reviewed & Updated per EMR.   Pertinent Historical Findings include:  Past Medical History:  Diagnosis Date  . Ankle fracture, right 2013  . Coronary atherosclerosis of native coronary artery 2005   Taxus drug-eluting stent to treat severe stenosis in the proximal LAD  . HTN (hypertension)   . Mixed dyslipidemia   . Osteoarthritis     Past Surgical History:  Procedure Laterality Date  . CERVICAL CONE BIOPSY  1978   benign  . CORONARY STENT PLACEMENT  11/2003   LAD  . TUBAL LIGATION Bilateral 1980    Family History  Problem Relation Age of Onset  . Heart attack Father 79       second at age 21  . Lymphoma Mother        Lymphoma  . Heart disease Brother   . Heart attack Paternal Grandmother   . Stroke Paternal Grandmother   . Heart attack Paternal Grandfather   . Stroke Paternal Grandfather   . Colon cancer Neg Hx   . Breast cancer Neg Hx   . Diabetes Neg Hx     Social History   Socioeconomic History  . Marital status: Married    Spouse name: Not on file  . Number of children: 1  . Years of education: Not on file  . Highest education level: Not on file  Occupational History  . 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 Smoking status: Former Smoker    Packs/day: 0.25    Years: 4.00    Pack years: 1.00    Types: Cigarettes    Start date: 08/26/1962    Quit date:  08/26/1966    Years since quitting: 53.7  . Smokeless tobacco: Never Used  Substance and Sexual Activity  . Alcohol use: Yes    Comment: glass of  wine daily.  . Drug use: No    Comment: marijuana -- very little yrs ago  . Sexual activity: Yes    Partners: Male    Birth control/protection: Surgical, Post-menopausal    Comment: tubal  Other Topics Concern  . Not on file  Social History Narrative   Household-- pt and husband (Mr [place])   Social Determinants of Health   Financial Resource Strain:   . Difficulty of Paying Living Expenses: Not on file  Food Insecurity:   . Worried About Charity fundraiser in the Last Year: Not on file  . Ran Out of Food in the Last Year: Not on file  Transportation Needs: No Transportation Needs  . Lack of Transportation (Medical): No  . Lack of Transportation (Non-Medical): No  Physical Activity:   . Days of Exercise per Week: Not on file  . Minutes of Exercise per Session: Not on file  Stress:   . Feeling of Stress : Not on  file  Social Connections:   . Frequency of Communication with Friends and Family: Not on file  . Frequency of Social Gatherings with Friends and Family: Not on file  . Attends Religious Services: Not on file  . Active Member of Clubs or Organizations: Not on file  . Attends Archivist Meetings: Not on file  . Marital Status: Not on file  Intimate Partner Violence:   . Fear of Current or Ex-Partner: Not on file  . Emotionally Abused: Not on file  . Physically Abused: Not on file  . Sexually Abused: Not on file     PHYSICAL EXAM:  VS: BP 140/88   Ht 5\' 4"  (1.626 m)   Wt 150 lb (68 kg)   BMI 25.75 kg/m  Physical Exam Gen: NAD, alert, cooperative with exam, well-appearing MSK:  Left hip: Tenderness to palpation greater trochanter. Normal strength resistance with hip flexion. Weakness with hip abduction. Negative straight leg raise. Neurovascularly intact   Aspiration/Injection Procedure  Note Kara Wheeler 1945/12/24  Procedure: Injection Indications: Left hip pain  Procedure Details Consent: Risks of procedure as well as the alternatives and risks of each were explained to the (patient/caregiver).  Consent for procedure obtained. Time Out: Verified patient identification, verified procedure, site/side was marked, verified correct patient position, special equipment/implants available, medications/allergies/relevent history reviewed, required imaging and test results available.  Performed.  The area was cleaned with iodine and alcohol swabs.    The left greater trochanteric bursa was injected using 1 cc's of 40 mg Depo-Medrol and 4 cc's of 0.25% bupivacaine with a 22 3 1/2" needle.  Ultrasound was used. Images were obtained in short views showing the injection.     A sterile dressing was applied.  Patient did tolerate procedure well.     ASSESSMENT & PLAN:   Greater trochanteric pain syndrome of left lower extremity Pain over the lateral aspect is greater trochanteric syndrome.  She does walk per her main exercise with weakness on hip abduction. -Counseled on home exercise therapy and supportive care. -Injection. -Could consider physical therapy

## 2020-04-30 DIAGNOSIS — M7918 Myalgia, other site: Secondary | ICD-10-CM | POA: Insufficient documentation

## 2020-04-30 NOTE — Assessment & Plan Note (Signed)
Pain over the lateral aspect is greater trochanteric syndrome.  She does walk per her main exercise with weakness on hip abduction. -Counseled on home exercise therapy and supportive care. -Injection. -Could consider physical therapy

## 2020-05-08 ENCOUNTER — Other Ambulatory Visit: Payer: Self-pay | Admitting: Internal Medicine

## 2020-05-30 ENCOUNTER — Ambulatory Visit (INDEPENDENT_AMBULATORY_CARE_PROVIDER_SITE_OTHER): Payer: Medicare HMO | Admitting: Family Medicine

## 2020-05-30 ENCOUNTER — Encounter: Payer: Self-pay | Admitting: Family Medicine

## 2020-05-30 ENCOUNTER — Other Ambulatory Visit: Payer: Self-pay

## 2020-05-30 DIAGNOSIS — M25552 Pain in left hip: Secondary | ICD-10-CM | POA: Diagnosis not present

## 2020-05-30 MED ORDER — NAPROXEN 500 MG PO TABS: 500.0000 mg | ORAL_TABLET | Freq: Every evening | ORAL | 1 refills | Status: DC | PRN

## 2020-05-30 NOTE — Assessment & Plan Note (Signed)
Did get improvement with the injection.  Most of her pain occurs in the middle of the night when she gets up.  May have some contribution from the SI joint or the facet joints. -Counseled on home exercise therapy and supportive care. -Naproxen. -Could consider physical therapy or an SI joint injection.

## 2020-05-30 NOTE — Progress Notes (Signed)
Kara Wheeler - 74 y.o. female MRN [id number]  Date of birth: Apr 09, 1946  SUBJECTIVE:  Including CC & ROS.  Chief Complaint  Patient presents with  . Follow-up    left hip    Kara Wheeler is a 74 y.o. female that is presenting with ongoing left hip pain.  She did get about a week and a half of improvement with the injection.  The pain has returned.  She mostly feels the pain when she gets up in the middle the night.  It is localized to the lateral aspect.  Denies any radicular symptoms.  Denies any back pain.  She does have a history of localized back pain..   Review of Systems See HPI   HISTORY: Past Medical, Surgical, Social, and Family History Reviewed & Updated per EMR.   Pertinent Historical Findings include:  Past Medical History:  Diagnosis Date  . Ankle fracture, right 2013  . Coronary atherosclerosis of native coronary artery 2005   Taxus drug-eluting stent to treat severe stenosis in the proximal LAD  . HTN (hypertension)   . Mixed dyslipidemia   . Osteoarthritis     Past Surgical History:  Procedure Laterality Date  . CERVICAL CONE BIOPSY  1978   benign  . CORONARY STENT PLACEMENT  11/2003   LAD  . TUBAL LIGATION Bilateral 1980    Family History  Problem Relation Age of Onset  . Heart attack Father 53       second at age 37  . Lymphoma Mother        Lymphoma  . Heart disease Brother   . Heart attack Paternal Grandmother   . Stroke Paternal Grandmother   . Heart attack Paternal Grandfather   . Stroke Paternal Grandfather   . Colon cancer Neg Hx   . Breast cancer Neg Hx   . Diabetes Neg Hx     Social History   Socioeconomic History  . Marital status: Married    Spouse name: Not on file  . Number of children: 1  . Years of education: Not on file  . Highest education level: Not on file  Occupational History  . 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 Smoking status: Former Smoker    Packs/day: 0.25     Years: 4.00    Pack years: 1.00    Types: Cigarettes    Start date: 08/26/1962    Quit date: 08/26/1966    Years since quitting: 53.7  . Smokeless tobacco: Never Used  Substance and Sexual Activity  . Alcohol use: Yes    Comment: glass of  wine daily.  . Drug use: No    Comment: marijuana -- very little yrs ago  . Sexual activity: Yes    Partners: Male    Birth control/protection: Surgical, Post-menopausal    Comment: tubal  Other Topics Concern  . Not on file  Social History Narrative   Household-- pt and husband (Mr [place])   Social Determinants of Health   Financial Resource Strain:   . Difficulty of Paying Living Expenses: Not on file  Food Insecurity:   . Worried About Charity fundraiser in the Last Year: Not on file  . Ran Out of Food in the Last Year: Not on file  Transportation Needs: No Transportation Needs  . Lack of Transportation (Medical): No  . Lack of Transportation (Non-Medical): No  Physical Activity:   . Days of Exercise per Week: Not on file  .  Minutes of Exercise per Session: Not on file  Stress:   . Feeling of Stress : Not on file  Social Connections:   . Frequency of Communication with Friends and Family: Not on file  . Frequency of Social Gatherings with Friends and Family: Not on file  . Attends Religious Services: Not on file  . Active Member of Clubs or Organizations: Not on file  . Attends Archivist Meetings: Not on file  . Marital Status: Not on file  Intimate Partner Violence:   . Fear of Current or Ex-Partner: Not on file  . Emotionally Abused: Not on file  . Physically Abused: Not on file  . Sexually Abused: Not on file     PHYSICAL EXAM:  VS: BP 136/86   Pulse 65   Ht 5\' 4"  (1.626 m)   Wt 150 lb (68 kg)   BMI 25.75 kg/m  Physical Exam Gen: NAD, alert, cooperative with exam, well-appearing      ASSESSMENT & PLAN:   Greater trochanteric pain syndrome of left lower extremity Did get improvement with the  injection.  Most of her pain occurs in the middle of the night when she gets up.  May have some contribution from the SI joint or the facet joints. -Counseled on home exercise therapy and supportive care. -Naproxen. -Could consider physical therapy or an SI joint injection.

## 2020-05-30 NOTE — Patient Instructions (Signed)
Good to see you Please try the naproxen before bed for 7 days. Then try every other night for 5 days. Then stop the medicine and see how you feel.  Please continue the exercises  Please try heat   Please send me a message in MyChart with any questions or updates.  Please see me back in 4 weeks.   --Dr. Raeford Razor

## 2020-06-29 ENCOUNTER — Ambulatory Visit (HOSPITAL_BASED_OUTPATIENT_CLINIC_OR_DEPARTMENT_OTHER)
Admission: RE | Admit: 2020-06-29 | Discharge: 2020-06-29 | Disposition: A | Discharge status: Home / Self Care | Payer: Medicare HMO | Source: Ambulatory Visit | Attending: Family Medicine | Admitting: Family Medicine

## 2020-06-29 ENCOUNTER — Encounter: Payer: Self-pay | Admitting: Family Medicine

## 2020-06-29 ENCOUNTER — Other Ambulatory Visit: Payer: Self-pay

## 2020-06-29 ENCOUNTER — Ambulatory Visit: Payer: Medicare HMO | Admitting: Family Medicine

## 2020-06-29 VITALS — BP 160/84 | HR 71 | Ht 64.0 in | Wt 150.0 lb

## 2020-06-29 DIAGNOSIS — Z13820 Encounter for screening for osteoporosis: Secondary | ICD-10-CM | POA: Diagnosis not present

## 2020-06-29 DIAGNOSIS — Z780 Asymptomatic menopausal state: Secondary | ICD-10-CM | POA: Insufficient documentation

## 2020-06-29 DIAGNOSIS — M25552 Pain in left hip: Secondary | ICD-10-CM | POA: Diagnosis not present

## 2020-06-29 DIAGNOSIS — M8580 Other specified disorders of bone density and structure, unspecified site: Secondary | ICD-10-CM | POA: Diagnosis not present

## 2020-06-29 NOTE — Progress Notes (Signed)
Kara Wheeler - 74 y.o. female MRN [id number]  Date of birth: [birth year]  SUBJECTIVE:  Including CC & ROS.  Chief Complaint  Patient presents with  . Follow-up    left hip    Kara Wheeler is a 74 y.o. female that is following up with her left hip pain.  The pain is still ongoing and is occurring in the same severity but initially presented.  Mostly feels the pain getting up at night.    Review of Systems See HPI   HISTORY: Past Medical, Surgical, Social, and Family History Reviewed & Updated per EMR.   Pertinent Historical Findings include:  Past Medical History:  Diagnosis Date  . Ankle fracture, right 2013  . Coronary atherosclerosis of native coronary artery 2005   Taxus drug-eluting stent to treat severe stenosis in the proximal LAD  . HTN (hypertension)   . Mixed dyslipidemia   . Osteoarthritis     Past Surgical History:  Procedure Laterality Date  . CERVICAL CONE BIOPSY  1978   benign  . CORONARY STENT PLACEMENT  11/2003   LAD  . TUBAL LIGATION Bilateral 1980    Family History  Problem Relation Age of Onset  . Heart attack Father 8       second at age 46  . Lymphoma Mother        Lymphoma  . Heart disease Brother   . Heart attack Paternal Grandmother   . Stroke Paternal Grandmother   . Heart attack Paternal Grandfather   . Stroke Paternal Grandfather   . Colon cancer Neg Hx   . Breast cancer Neg Hx   . Diabetes Neg Hx     Social History   Socioeconomic History  . Marital status: Married    Spouse name: Not on file  . Number of children: 1  . Years of education: Not on file  . Highest education level: Not on file  Occupational History  . 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 Smoking status: Former Smoker    Packs/day: 0.25    Years: 4.00    Pack years: 1.00    Types: Cigarettes    Start date: 08/26/1962    Quit date: 08/26/1966    Years since quitting: 53.8  . Smokeless tobacco: Never Used    Substance and Sexual Activity  . Alcohol use: Yes    Comment: glass of  wine daily.  . Drug use: No    Comment: marijuana -- very little yrs ago  . Sexual activity: Yes    Partners: Male    Birth control/protection: Surgical, Post-menopausal    Comment: tubal  Other Topics Concern  . Not on file  Social History Narrative   Household-- pt and husband (Mr [place])   Social Determinants of Health   Financial Resource Strain:   . Difficulty of Paying Living Expenses: Not on file  Food Insecurity:   . Worried About Charity fundraiser in the Last Year: Not on file  . Ran Out of Food in the Last Year: Not on file  Transportation Needs: No Transportation Needs  . Lack of Transportation (Medical): No  . Lack of Transportation (Non-Medical): No  Physical Activity:   . Days of Exercise per Week: Not on file  . Minutes of Exercise per Session: Not on file  Stress:   . Feeling of Stress : Not on file  Social Connections:   . Frequency of Communication with Friends and Family:  Not on file  . Frequency of Social Gatherings with Friends and Family: Not on file  . Attends Religious Services: Not on file  . Active Member of Clubs or Organizations: Not on file  . Attends Archivist Meetings: Not on file  . Marital Status: Not on file  Intimate Partner Violence:   . Fear of Current or Ex-Partner: Not on file  . Emotionally Abused: Not on file  . Physically Abused: Not on file  . Sexually Abused: Not on file     PHYSICAL EXAM:  VS: BP (!) 160/84   Pulse 71   Ht 5\' 4"  (1.626 m)   Wt 150 lb (68 kg)   BMI 25.75 kg/m  Physical Exam Gen: NAD, alert, cooperative with exam, well-appearing    ASSESSMENT & PLAN:   Greater trochanteric pain syndrome of left lower extremity Did get improvement initially with the trochanteric injection.  Pain has returned and is still occurring regardless of current treatment.  Unclear if this is more of referred pain from the lumbar spine or  the SI joint.  Does not appear to be intra-articular currently. -Counseled on home exercise therapy and supportive care. -Discussed bone density and will screen for osteoporosis with possible contribution from the lumbar spine. -Counseled on Voltaren between naproxen. -Referral to physical therapy. -X-ray -Consider SI joint injection or further imaging.

## 2020-06-29 NOTE — Assessment & Plan Note (Signed)
Did get improvement initially with the trochanteric injection.  Pain has returned and is still occurring regardless of current treatment.  Unclear if this is more of referred pain from the lumbar spine or the SI joint.  Does not appear to be intra-articular currently. -Counseled on home exercise therapy and supportive care. -Discussed bone density and will screen for osteoporosis with possible contribution from the lumbar spine. -Counseled on Voltaren between naproxen. -Referral to physical therapy. -X-ray -Consider SI joint injection or further imaging.

## 2020-06-29 NOTE — Patient Instructions (Signed)
Good to see you Please try Voltaren on days that you don't take the naproxen  Physical therapy will call to schedule  They will call to schedule the bone density and I will call with the results  I will call with the xray results from today   Please send me a message in MyChart with any questions or updates.  Please see me back in 4-6 weeks.   --Dr. Raeford Razor

## 2020-06-30 ENCOUNTER — Encounter: Payer: Self-pay | Admitting: Internal Medicine

## 2020-06-30 DIAGNOSIS — Z20822 Contact with and (suspected) exposure to covid-19: Secondary | ICD-10-CM | POA: Diagnosis not present

## 2020-07-03 ENCOUNTER — Telehealth: Payer: Self-pay | Admitting: Family Medicine

## 2020-07-03 NOTE — Telephone Encounter (Signed)
Left VM for patient. If her calls back please have her speak with a nurse/CMA and inform that her xrays are normal and her bone density if normal. We will continue plan for physical therapy.   If any questions then please take the best time and phone number to call and I will try to call her back.   Rosemarie Ax, MD [facility] 07/03/2020, 10:40 AM

## 2020-07-11 ENCOUNTER — Encounter: Payer: Self-pay | Admitting: Rehabilitative and Restorative Service Providers"

## 2020-07-11 ENCOUNTER — Other Ambulatory Visit: Payer: Self-pay

## 2020-07-11 ENCOUNTER — Ambulatory Visit (INDEPENDENT_AMBULATORY_CARE_PROVIDER_SITE_OTHER): Payer: Medicare HMO | Admitting: Rehabilitative and Restorative Service Providers"

## 2020-07-11 DIAGNOSIS — M25552 Pain in left hip: Secondary | ICD-10-CM

## 2020-07-11 DIAGNOSIS — R29898 Other symptoms and signs involving the musculoskeletal system: Secondary | ICD-10-CM

## 2020-07-11 NOTE — Therapy (Signed)
[place], [place], [address] Phone: [phone]   Fax:  [phone]  Physical Therapy Evaluation  Patient Details  Name: Kara Wheeler MRN: [id number] Date of Birth: March 28, 1946 Referring Provider (PT): Dr  Clearance Coots    Encounter Date: 07/11/2020   PT End of Session - 07/11/20 1110    Visit Number 1    Number of Visits 12    Date for PT Re-Evaluation 08/22/20    Authorization Type [employer]    Authorization Time Period 07/11/20-08/22/20    Authorization - Visit Number 12    Authorization - Number of Visits 1    Progress Note Due on Visit 10    PT Start Time 7341    PT Stop Time 1105    PT Time Calculation (min) 50 min    Activity Tolerance Patient tolerated treatment well           Past Medical History:  Diagnosis Date  . Ankle fracture, right 2013  . Coronary atherosclerosis of native coronary artery 2005   Taxus drug-eluting stent to treat severe stenosis in the proximal LAD  . HTN (hypertension)   . Mixed dyslipidemia   . Osteoarthritis     Past Surgical History:  Procedure Laterality Date  . CERVICAL CONE BIOPSY  1978   benign  . CORONARY STENT PLACEMENT  11/2003   LAD  . TUBAL LIGATION Bilateral 1980    There were no vitals filed for this visit.    Subjective Assessment - 07/11/20 1020    Subjective Patient reports that she is having pain in the Lt hip which is worse in the morning and feels better during the day. Medication has helped but she still has pain in the morning or if she gets up during the night to go to the bathroom. Symptoms started ~ 8-9 months ago.    Pertinent History Rt LB/hip pain treated in PT 2017; arthritis    Patient Stated Goals get rid of pain    Currently in Pain? Yes    Pain Score 1     Pain Location Hip    Pain Orientation Left    Pain Descriptors / Indicators Aching;Tightness    Pain Type Chronic pain    Pain Onset More than a month  ago    Pain Frequency Intermittent    Aggravating Factors  worse when getting up in the morning when she gets out of bed    Pain Relieving Factors better with movement in the morning; meds              [facility] PT Assessment - 07/11/20 0001      Assessment   Medical Diagnosis Lt greater trochanter pain     Referring Provider (PT) Dr  Clearance Coots     Onset Date/Surgical Date 09/27/19    Hand Dominance Right    Next MD Visit 12/21    Prior Therapy here 2017 for Rt hip/LB      Precautions   Precautions None      Restrictions   Weight Bearing Restrictions No      Balance Screen   Has the patient fallen in the past 6 months No    Has the patient had a decrease in activity level because of a fear of falling?  No    Is the patient reluctant to leave their home because of a fear of falling?  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    Leisure household chores; [employer] - exercise class; yoga; water exercises 3 days/wk       Observation/Other Assessments   Focus on Therapeutic Outcomes (FOTO)  30% limitation       Sensation   Additional Comments WNL's per pt report       Posture/Postural Control   Posture Comments head forward; shoulders rounded; flexed forward at hips       AROM   Lumbar Flexion 95%     Lumbar Extension 60%     Lumbar - Right Side Bend 90%     Lumbar - Left Side Bend 90%    Lumbar - Right Rotation 30%    Lumbar - Left Rotation 30%       Strength   Overall Strength Comments WFL's bilat LE not tested resistively       Flexibility   Hamstrings tight Lt > Rt    Quadriceps tight bilat Rt > Lt     ITB tight Lt > Rt    Piriformis tight Lt       Palpation   Spinal mobility WFL's     SI assessment  Rt hemipelvis higher than Lt with increased skin folds Rt; higher PSIS; ASIS; crest of pelvis      Palpation comment muscular tightness Lt posterior hip through the glut min/med/max into the piriformis       Special Tests   Other special tests (-) SLR       Ambulation/Gait   Gait Comments Antalgic gait upon initiation of gait; limp Lt LE                       Objective measurements completed on examination: See above findings.       [facility] Adult PT Treatment/Exercise - 07/11/20 0001      Self-Care   Other Self-Care Comments  education re-sleeping position; avoiding sitting with LE's crossed       Knee/Hip Exercises: Stretches   Passive Hamstring Stretch Left;Right;2 reps;30 seconds   supine with strap    Quad Stretch Right;Left;2 reps;30 seconds   prone with strap    Hip Flexor Stretch Right;Left;2 reps;30 seconds   seated   ITB Stretch Left;2 reps;30 seconds   supine with strap    Piriformis Stretch Left;3 reps;30 seconds   supine travell      Knee/Hip Exercises: Supine   Other Supine Knee/Hip Exercises windshield wiper x 15-20                  PT Education - 07/11/20 1049    Education Details HEP; POC; posture and body mechanics education; DN    Person(s) Educated Patient    Methods Explanation;Demonstration;Tactile cues;Verbal cues;Handout    Comprehension Verbalized understanding;Returned demonstration;Verbal cues required;Tactile cues required               PT Long Term Goals - 07/11/20 1125      PT LONG TERM GOAL #1   Title Improve tissue extensibility Lt LE to palpation and with stretching    Time 6    Period Weeks    Status New    Target Date 08/22/20      PT LONG TERM GOAL #2   Title Eliminate morning pain Lt LE    Time 6  Period Weeks    Target Date 08/22/20      PT LONG TERM GOAL #3   Title Independent in HEP    Time 6    Period Weeks    Status New    Target Date 08/22/20      PT LONG TERM GOAL #4   Title Improve FOTO to </= 28% limitation    Time 6    Period Weeks    Status New    Target Date 08/22/20                    Plan - 07/11/20 1120    Clinical Impression Statement Patient presents with 7-8 month history of Lt hip pain which is worse in the morning and improves later during the day. Signs and symptoms are consistent with piriformis syndrome with muscular tightness in the Lt hip through the piriformis and hip rotators; decreased hip mobilty; pain with functional activities and ADL's. She will benefit from PT to address problems identified.    Stability/Clinical Decision Making Stable/Uncomplicated    Clinical Decision Making Low    Rehab Potential Good    PT Frequency 2x / week    PT Duration 6 weeks    PT Treatment/Interventions ADLs/Self Care Home Management;Aquatic Therapy;Cryotherapy;Electrical Stimulation;Iontophoresis 4mg /ml Dexamethasone;Moist Heat;Ultrasound;Stair training;Gait training;Functional mobility training;Therapeutic activities;Balance training;Therapeutic exercise;Neuromuscular re-education;Patient/family education;Manual techniques;Passive range of motion;Dry needling;Taping    PT Next Visit Plan review HEP; trial of DN and/or manual work Lt posterior hip/pirifromis glut min/med; trial of TENS unit for possible purchase for home; modalities as indicated - (check to see if she is modifying sleeping position getting off stomach with LE in figure 4)    PT Home Exercise Plan [email or web address]    Consulted and Agree with Plan of Care Patient           Patient will benefit from skilled therapeutic intervention in order to improve the following deficits and impairments:  Abnormal gait, Decreased range of motion, Increased fascial restricitons, Decreased activity tolerance, Pain, Impaired flexibility, Improper body mechanics, Decreased mobility, Postural dysfunction  Visit Diagnosis: Pain in left hip - Plan: PT plan of care cert/re-cert  Other symptoms and signs involving the musculoskeletal system - Plan: PT plan of care cert/re-cert     Problem List Patient Active Problem  List   Diagnosis Date Noted  . Greater trochanteric pain syndrome of left lower extremity 04/30/2020  . Annual physical exam 04/02/2016  . Low back pain 03/27/2016  . PCP NOTES >>>>>>>>>>>>>>>> 01/16/2016  . Dyslipidemia 04/24/2010  . ESSENTIAL HYPERTENSION, BENIGN 04/24/2010  . CORONARY ATHEROSCLEROSIS NATIVE CORONARY ARTERY 04/24/2010    Metha Kolasa Nilda Simmer PT, MPH  07/11/2020, 11:32 AM  [facility] [place], [place], [address] Phone: [phone]   Fax:  [phone]  Name: Kara Wheeler MRN: [id number] Date of Birth: Jul 14, 1946

## 2020-07-11 NOTE — Patient Instructions (Addendum)
°  Access Code: [email or web address]: [email or web address]: 11/16/2021Prepared by: Tiawanna Luchsinger HoltExercises  Prone Quadriceps Stretch with Strap - 2 x daily - 7 x weekly - 1 sets - 3 reps - 30 sec hold  Hooklying Hamstring Stretch with Strap - 2 x daily - 7 x weekly - 1 sets - 3 reps - 30 sec hold  Supine ITB Stretch with Strap - 2 x daily - 7 x weekly - 1 sets - 3 reps - 30 sec hold  Supine Piriformis Stretch with Leg Straight - 2 x daily - 7 x weekly - 1 sets - 3 reps - 30 sec hold Patient Education  Trigger Point Dry Needling  TENS Unit  Posture and Body Mechanics

## 2020-07-13 ENCOUNTER — Encounter: Payer: Self-pay | Admitting: Rehabilitative and Restorative Service Providers"

## 2020-07-13 ENCOUNTER — Other Ambulatory Visit: Payer: Self-pay

## 2020-07-13 ENCOUNTER — Ambulatory Visit (INDEPENDENT_AMBULATORY_CARE_PROVIDER_SITE_OTHER): Payer: Medicare HMO | Admitting: Rehabilitative and Restorative Service Providers"

## 2020-07-13 DIAGNOSIS — R29898 Other symptoms and signs involving the musculoskeletal system: Secondary | ICD-10-CM

## 2020-07-13 DIAGNOSIS — M25552 Pain in left hip: Secondary | ICD-10-CM

## 2020-07-13 NOTE — Patient Instructions (Signed)
Trigger Point Dry Needling  . What is Trigger Point Dry Needling (DN)? o DN is a physical therapy technique used to treat muscle pain and dysfunction. Specifically, DN helps deactivate muscle trigger points (muscle knots).  o A thin filiform needle is used to penetrate the skin and stimulate the underlying trigger point. The goal is for a local twitch response (LTR) to occur and for the trigger point to relax. No medication of any kind is injected during the procedure.   . What Does Trigger Point Dry Needling Feel Like?  o The procedure feels different for each individual patient. Some patients report that they do not actually feel the needle enter the skin and overall the process is not painful. Very mild bleeding may occur. However, many patients feel a deep cramping in the muscle in which the needle was inserted. This is the local twitch response.   Marland Kitchen How Will I feel after the treatment? o Soreness is normal, and the onset of soreness may not occur for a few hours. Typically this soreness does not last longer than two days.  o Bruising is uncommon, however; ice can be used to decrease any possible bruising.  o In rare cases feeling tired or nauseous after the treatment is normal. In addition, your symptoms may get worse before they get better, this period will typically not last longer than 24 hours.   . What Can I do After My Treatment? o Increase your hydration by drinking more water for the next 24 hours. o You may place ice or heat on the areas treated that have become sore, however, do not use heat on inflamed or bruised areas. Heat often brings more relief post needling. o You can continue your regular activities, but vigorous activity is not recommended initially after the treatment for 24 hours. o DN is best combined with other physical therapy such as strengthening, stretching, and other therapies.   Access Code: [email or web address]: [email or web address]: 11/18/2021Prepared  by: Avyn Aden HoltExercises  Prone Quadriceps Stretch with Strap - 2 x daily - 7 x weekly - 1 sets - 3 reps - 30 sec hold  Hooklying Hamstring Stretch with Strap - 2 x daily - 7 x weekly - 1 sets - 3 reps - 30 sec hold  Supine ITB Stretch with Strap - 2 x daily - 7 x weekly - 1 sets - 3 reps - 30 sec hold  Supine Piriformis Stretch with Leg Straight - 2 x daily - 7 x weekly - 1 sets - 3 reps - 30 sec hold  Hip Flexor Stretch at Edge of Bed - 2 x daily - 7 x weekly - 1 sets - 3 reps - 30 sec hold

## 2020-07-13 NOTE — Therapy (Signed)
[place], [place], [address] Phone: [phone]   Fax:  [phone]  Physical Therapy Treatment  Patient Details  Name: Kara Wheeler MRN: [id number] Date of Birth: 02/16/1946 Referring Provider (PT): Dr  Clearance Coots    Encounter Date: 07/13/2020   PT End of Session - 07/13/20 1618    Visit Number 2    Number of Visits 12    Date for PT Re-Evaluation 08/22/20    Authorization Type [employer]    Authorization - Visit Number 12    Authorization - Number of Visits 1    Progress Note Due on Visit 10    PT Start Time 1616    PT Stop Time 1704    PT Time Calculation (min) 48 min    Activity Tolerance Patient tolerated treatment well           Past Medical History:  Diagnosis Date  . Ankle fracture, right 2013  . Coronary atherosclerosis of native coronary artery 2005   Taxus drug-eluting stent to treat severe stenosis in the proximal LAD  . HTN (hypertension)   . Mixed dyslipidemia   . Osteoarthritis     Past Surgical History:  Procedure Laterality Date  . CERVICAL CONE BIOPSY  1978   benign  . CORONARY STENT PLACEMENT  11/2003   LAD  . TUBAL LIGATION Bilateral 1980    There were no vitals filed for this visit.   Subjective Assessment - 07/13/20 1636    Subjective Patient reports that her hip is stil painful in the mornings. She is working on changing her sleeping positon.    Currently in Pain? Yes    Pain Score 1     Pain Location Hip    Pain Orientation Left    Pain Descriptors / Indicators Aching;Tightness    Pain Type Chronic pain                             [facility] Adult PT Treatment/Exercise - 07/13/20 0001      Self-Care   Other Self-Care Comments  continued education re-sleeping position; avoiding sitting with LE's crossed       Knee/Hip Exercises: Stretches   Passive Hamstring Stretch Left;Right;2 reps;30 seconds   supine with strap    Hip  Flexor Stretch Left;2 reps;30 seconds   supine   ITB Stretch Left;2 reps;30 seconds   supine with strap    Piriformis Stretch Left;3 reps;30 seconds   supine travell      Knee/Hip Exercises: Supine   Writer;Both   2 reps some discomfort Lt hip    Other Supine Knee/Hip Exercises windshield wiper x 15-20      Moist Heat Therapy   Number Minutes Moist Heat 10 Minutes    Moist Heat Location Lumbar Spine;Hip      Manual Therapy   Manual therapy comments skilled palpation to assess tissue response to DN/manual work     Joint Mobilization PA mobs Lt greater trochanter     Soft tissue mobilization through the Lt posterior hip - piriformis/gluts    Myofascial Release Lt posterior hip    Passive ROM I/ER Lt hip pt prone hip extended, knee 90 deg flexed             Trigger Point Dry Needling - 07/13/20 0001    Consent Given? Yes    Education Handout Provided Yes    Other Dry Needling  Lt    Gluteus Maximus Response Palpable increased muscle length    Piriformis Response Palpable increased muscle length                PT Education - 07/13/20 1700    Education Details HEP DN    Person(s) Educated Patient    Methods Explanation;Demonstration;Tactile cues;Verbal cues;Handout    Comprehension Verbalized understanding;Returned demonstration;Verbal cues required;Tactile cues required               PT Long Term Goals - 07/11/20 1125      PT LONG TERM GOAL #1   Title Improve tissue extensibility Lt LE to palpation and with stretching    Time 6    Period Weeks    Status New    Target Date 08/22/20      PT LONG TERM GOAL #2   Title Eliminate morning pain Lt LE    Time 6    Period Weeks    Target Date 08/22/20      PT LONG TERM GOAL #3   Title Independent in HEP    Time 6    Period Weeks    Status New    Target Date 08/22/20      PT LONG TERM GOAL #4   Title Improve FOTO to </= 28% limitation    Time 6    Period Weeks    Status New    Target Date  08/22/20                 Plan - 07/13/20 1643    Clinical Impression Statement Patient is working to correct sleeping position and working on exercises at home. Tolerated DN and manual work well. Significant tightness noted in the Lt pisiformis and glut. Good response to manual work and DN.    Rehab Potential Good    PT Frequency 2x / week    PT Duration 6 weeks    PT Treatment/Interventions ADLs/Self Care Home Management;Aquatic Therapy;Cryotherapy;Electrical Stimulation;Iontophoresis 4mg /ml Dexamethasone;Moist Heat;Ultrasound;Stair training;Gait training;Functional mobility training;Therapeutic activities;Balance training;Therapeutic exercise;Neuromuscular re-education;Patient/family education;Manual techniques;Passive range of motion;Dry needling;Taping    PT Next Visit Plan review HEP; assess response to trial of DN and manual work Lt posterior hip/pirifromis glut min/med; possible trial of TENS unit for possible purchase for home; modalities as indicated    PT Home Exercise Plan [email or web address]    Consulted and Agree with Plan of Care Patient           Patient will benefit from skilled therapeutic intervention in order to improve the following deficits and impairments:     Visit Diagnosis: Pain in left hip  Other symptoms and signs involving the musculoskeletal system     Problem List Patient Active Problem List   Diagnosis Date Noted  . Greater trochanteric pain syndrome of left lower extremity 04/30/2020  . Annual physical exam 04/02/2016  . Low back pain 03/27/2016  . PCP NOTES >>>>>>>>>>>>>>>> 01/16/2016  . Dyslipidemia 04/24/2010  . ESSENTIAL HYPERTENSION, BENIGN 04/24/2010  . CORONARY ATHEROSCLEROSIS NATIVE CORONARY ARTERY 04/24/2010    [name] PT, MPH  07/13/2020, 5:02 PM  [facility] [place], [place], [address] Phone: [phone]   Fax:  [phone]  Name: Kara Wheeler MRN: [id number] Date of Birth: 1946/02/21

## 2020-07-19 ENCOUNTER — Other Ambulatory Visit: Payer: Self-pay | Admitting: Family Medicine

## 2020-07-19 ENCOUNTER — Encounter: Payer: Medicare HMO | Admitting: Rehabilitative and Restorative Service Providers"

## 2020-07-24 ENCOUNTER — Other Ambulatory Visit: Payer: Self-pay

## 2020-07-24 ENCOUNTER — Ambulatory Visit (INDEPENDENT_AMBULATORY_CARE_PROVIDER_SITE_OTHER): Payer: Medicare HMO | Admitting: Physical Therapy

## 2020-07-24 DIAGNOSIS — R29898 Other symptoms and signs involving the musculoskeletal system: Secondary | ICD-10-CM

## 2020-07-24 DIAGNOSIS — M25552 Pain in left hip: Secondary | ICD-10-CM | POA: Diagnosis not present

## 2020-07-24 NOTE — Therapy (Signed)
[place], [place], [address] Phone: [phone]   Fax:  [phone]  Physical Therapy Treatment  Patient Details  Name: LYNORE COSCIA MRN: [id number] Date of Birth: [birth year] Referring Provider (PT): Dr  Clearance Coots    Encounter Date: 07/24/2020   PT End of Session - 07/24/20 1446    Visit Number 3    Number of Visits 12    Date for PT Re-Evaluation 08/22/20    Authorization Type [employer]    Authorization Time Period 07/11/20-08/22/20    Authorization - Visit Number 12    Authorization - Number of Visits 1    Progress Note Due on Visit 10    PT Start Time 4128    PT Stop Time 1433    PT Time Calculation (min) 40 min    Activity Tolerance Patient tolerated treatment well    Behavior During Therapy [facility] for tasks assessed/performed           Past Medical History:  Diagnosis Date  . Ankle fracture, right 2013  . Coronary atherosclerosis of native coronary artery 2005   Taxus drug-eluting stent to treat severe stenosis in the proximal LAD  . HTN (hypertension)   . Mixed dyslipidemia   . Osteoarthritis     Past Surgical History:  Procedure Laterality Date  . CERVICAL CONE BIOPSY  1978   benign  . CORONARY STENT PLACEMENT  11/2003   LAD  . TUBAL LIGATION Bilateral 1980    There were no vitals filed for this visit.   Subjective Assessment - 07/24/20 1355    Subjective My hip feels about the same, still bothering me. I've been sleeping a little better. Mornings are still a bigger issue than the rest of the day, once I've been moving I feel good. Still having a hard time staying off of her stomach when sleeping at night.    Pertinent History Rt LB/hip pain treated in PT 2017; arthritis    Patient Stated Goals get rid of pain    Currently in Pain? No/denies   at its worst, 6-7/10 when I first get up                            [facility] Adult PT Treatment/Exercise  - 07/24/20 0001      Knee/Hip Exercises: Stretches   Piriformis Stretch Left;2 reps;30 seconds      Knee/Hip Exercises: Standing   Other Standing Knee Exercises sit to stand with no UEs, focus on eccentric control and glute activation  x10      Knee/Hip Exercises: Supine   Bridges Strengthening;10 reps    Straight Leg Raises Strengthening;Right;Left;1 set;10 reps    Straight Leg Raises Limitations cues to avoid spine arching with this exercise     Other Supine Knee/Hip Exercises supine clamshells with red TB 1x10, 3 second holds       Manual Therapy   Joint Mobilization inferior hip mobilizations L     Soft tissue mobilization L piriformis/glutes and L paraspinals                   PT Education - 07/24/20 1446    Education Details HEP update, impact of quality sleep on inflammation and pain reduction, exercise form    Person(s) Educated Patient    Methods Explanation;Demonstration    Comprehension Verbalized understanding;Returned demonstration  PT Long Term Goals - 07/11/20 1125      PT LONG TERM GOAL #1   Title Improve tissue extensibility Lt LE to palpation and with stretching    Time 6    Period Weeks    Status New    Target Date 08/22/20      PT LONG TERM GOAL #2   Title Eliminate morning pain Lt LE    Time 6    Period Weeks    Target Date 08/22/20      PT LONG TERM GOAL #3   Title Independent in HEP    Time 6    Period Weeks    Status New    Target Date 08/22/20      PT LONG TERM GOAL #4   Title Improve FOTO to </= 28% limitation    Time 6    Period Weeks    Status New    Target Date 08/22/20                 Plan - 07/24/20 1447    Clinical Impression Statement Peg arrives in good spirits today but admits that she was not very compliant with her HEP over the holiday; working on changing her sleep position and has been resting better but still ends up on her stomach. Worked on hip joint inferior mobilizations with  moderate reduction in pain, followed by STM to piriformis and lumbar extensors B. Introduced increased focus on strengthening today and updated HEP as appropriate.     Stability/Clinical Decision Making Stable/Uncomplicated    Rehab Potential Good    PT Frequency 2x / week    PT Duration 6 weeks    PT Treatment/Interventions ADLs/Self Care Home Management;Aquatic Therapy;Cryotherapy;Electrical Stimulation;Iontophoresis 4mg /ml Dexamethasone;Moist Heat;Ultrasound;Stair training;Gait training;Functional mobility training;Therapeutic activities;Balance training;Therapeutic exercise;Neuromuscular re-education;Patient/family education;Manual techniques;Passive range of motion;Dry needling;Taping    PT Next Visit Plan review HEP, continue joint mobilizations and consider PAs to lumbar spine if appropriate; continue manual to L glutes/piriformis and TFL; continue targeted strengthening    PT Home Exercise Plan [email or web address]; [email or web address]    Consulted and Agree with Plan of Care Patient           Patient will benefit from skilled therapeutic intervention in order to improve the following deficits and impairments:  Abnormal gait, Decreased range of motion, Increased fascial restricitons, Decreased activity tolerance, Pain, Impaired flexibility, Improper body mechanics, Decreased mobility, Postural dysfunction  Visit Diagnosis: Pain in left hip  Other symptoms and signs involving the musculoskeletal system     Problem List Patient Active Problem List   Diagnosis Date Noted  . Greater trochanteric pain syndrome of left lower extremity 04/30/2020  . Annual physical exam 04/02/2016  . Low back pain 03/27/2016  . PCP NOTES >>>>>>>>>>>>>>>> 01/16/2016  . Dyslipidemia 04/24/2010  . ESSENTIAL HYPERTENSION, BENIGN 04/24/2010  . CORONARY ATHEROSCLEROSIS NATIVE CORONARY ARTERY 04/24/2010    Ann Lions PT, DPT, PN1   Supplemental Physical Therapist [facility]    Pager [phone] Acute Rehab Office  [facility] [place], [place], [address] Phone: [phone]   Fax:  [phone]  Name: KALIS FRIESE MRN: [id number] Date of Birth: Aug 06, 1946

## 2020-07-27 ENCOUNTER — Encounter: Payer: Self-pay | Admitting: Physical Therapy

## 2020-07-27 ENCOUNTER — Ambulatory Visit (INDEPENDENT_AMBULATORY_CARE_PROVIDER_SITE_OTHER): Payer: Medicare HMO | Admitting: Physical Therapy

## 2020-07-27 DIAGNOSIS — R29898 Other symptoms and signs involving the musculoskeletal system: Secondary | ICD-10-CM

## 2020-07-27 DIAGNOSIS — M25552 Pain in left hip: Secondary | ICD-10-CM

## 2020-07-27 NOTE — Patient Instructions (Signed)
Access Code: [email or web address] URL: [email or web address] Date: 07/27/2020 Prepared by: Almyra Free  Exercises Prone Quadriceps Stretch with Strap - 2 x daily - 7 x weekly - 1 sets - 3 reps - 30 sec hold Hooklying Hamstring Stretch with Strap - 2 x daily - 7 x weekly - 1 sets - 3 reps - 30 sec hold Supine ITB Stretch with Strap - 2 x daily - 7 x weekly - 1 sets - 3 reps - 30 sec hold Supine Piriformis Stretch with Leg Straight - 2 x daily - 7 x weekly - 1 sets - 3 reps - 30 sec hold Hip Flexor Stretch at Edge of Bed - 2 x daily - 7 x weekly - 1 sets - 3 reps - 30 sec hold Prone Press Up - 3-4 x daily - 7 x weekly - 1-3 sets - 10 reps Supine Transversus Abdominis Bracing - Hands on Stomach - 2 x daily - 7 x weekly - 1 sets - 10 reps - 5 sec hold Cat-Camel - 2 x daily - 7 x weekly - 1 sets - 10 reps

## 2020-07-27 NOTE — Therapy (Signed)
[place] [address] [place], [place], [address] Phone: [phone]   Fax:  [phone]  Physical Therapy Treatment  Patient Details  Name: BERKELEY VELDMAN MRN: [id number] Date of Birth: 05/17/1946 Referring Provider (PT): Dr  Clearance Coots    Encounter Date: 07/27/2020   PT End of Session - 07/27/20 1401    Visit Number 4    Number of Visits 12    Date for PT Re-Evaluation 08/22/20    Authorization Type [employer]    Authorization Time Period 07/11/20-08/22/20    Progress Note Due on Visit 10    PT Start Time 1402    PT Stop Time 1448    PT Time Calculation (min) 46 min    Activity Tolerance Patient tolerated treatment well    Behavior During Therapy [facility] for tasks assessed/performed           Past Medical History:  Diagnosis Date  . Ankle fracture, right 2013  . Coronary atherosclerosis of native coronary artery 2005   Taxus drug-eluting stent to treat severe stenosis in the proximal LAD  . HTN (hypertension)   . Mixed dyslipidemia   . Osteoarthritis     Past Surgical History:  Procedure Laterality Date  . CERVICAL CONE BIOPSY  1978   benign  . CORONARY STENT PLACEMENT  11/2003   LAD  . TUBAL LIGATION Bilateral 1980    There were no vitals filed for this visit.   Subjective Assessment - 07/27/20 1402    Subjective Morning is still the worst. The past few days noticing it more with other ADLS.    Pertinent History Rt LB/hip pain treated in PT 2017; arthritis    Patient Stated Goals get rid of pain    Currently in Pain? Yes    Pain Score 2     Pain Location Hip    Pain Orientation Left    Pain Descriptors / Indicators Aching;Tightness                             [facility] Adult PT Treatment/Exercise - 07/27/20 0001      Exercises   Exercises Knee/Hip      Knee/Hip Exercises: Stretches   Other Knee/Hip Stretches cat/cow x 5; child's pose 2x30 sec      Knee/Hip Exercises:  Seated   Other Seated Knee/Hip Exercises lumbar flex/ext on dyna disc x 10      Knee/Hip Exercises: Supine   Bridges 10 reps    Other Supine Knee/Hip Exercises TA sets 5 sec hold; some difficulty isolating at first from obliques and respiratory muscles    Other Supine Knee/Hip Exercises pelvic tilts x 5      Knee/Hip Exercises: Sidelying   Other Sidelying Knee/Hip Exercises lying over pillow on right side       Knee/Hip Exercises: Prone   Other Prone Exercises press ups 3x10   decreased motion in lower lumbar; improved after mobs     Manual Therapy   Manual Therapy Joint mobilization    Manual therapy comments Skilled palpation and monitoring of soft tissues during DN    Joint Mobilization UPA mobs to bil lumbar; grade IV to left L5/S1; then overpressure left at L4/5, L5/S1                  PT Education - 07/27/20 1612    Education Details added prone press ups, cat/cow and TA set  Person(s) Educated Patient    Methods Explanation;Demonstration;Handout    Comprehension Verbalized understanding;Returned demonstration               PT Long Term Goals - 07/11/20 1125      PT LONG TERM GOAL #1   Title Improve tissue extensibility Lt LE to palpation and with stretching    Time 6    Period Weeks    Status New    Target Date 08/22/20      PT LONG TERM GOAL #2   Title Eliminate morning pain Lt LE    Time 6    Period Weeks    Target Date 08/22/20      PT LONG TERM GOAL #3   Title Independent in HEP    Time 6    Period Weeks    Status New    Target Date 08/22/20      PT LONG TERM GOAL #4   Title Improve FOTO to </= 28% limitation    Time 6    Period Weeks    Status New    Target Date 08/22/20                 Plan - 07/27/20 1612    Clinical Impression Statement Patient reporting decreased pain overall but increased frequency with ADLs. We assessed the back more today and she is very stiff and painful in L5/S1. She responded well to PA mobs and  DN to this area showing increased extension with pressups after manual work. We also intiated TA strenghthening.    PT Treatment/Interventions ADLs/Self Care Home Management;Aquatic Therapy;Cryotherapy;Electrical Stimulation;Iontophoresis 4mg /ml Dexamethasone;Moist Heat;Ultrasound;Stair training;Gait training;Functional mobility training;Therapeutic activities;Balance training;Therapeutic exercise;Neuromuscular re-education;Patient/family education;Manual techniques;Passive range of motion;Dry needling;Taping    PT Next Visit Plan check response to press ups, continue joint mobilizations and PAs to lumbar spine; continue core strength    PT Home Exercise Plan [id number]; [id number]           Patient will benefit from skilled therapeutic intervention in order to improve the following deficits and impairments:  Abnormal gait, Decreased range of motion, Increased fascial restricitons, Decreased activity tolerance, Pain, Impaired flexibility, Improper body mechanics, Decreased mobility, Postural dysfunction  Visit Diagnosis: Pain in left hip  Other symptoms and signs involving the musculoskeletal system     Problem List Patient Active Problem List   Diagnosis Date Noted  . Greater trochanteric pain syndrome of left lower extremity 04/30/2020  . Annual physical exam 04/02/2016  . Low back pain 03/27/2016  . PCP NOTES >>>>>>>>>>>>>>>> 01/16/2016  . Dyslipidemia 04/24/2010  . ESSENTIAL HYPERTENSION, BENIGN 04/24/2010  . CORONARY ATHEROSCLEROSIS NATIVE CORONARY ARTERY 04/24/2010    Madelyn Flavors PT 07/27/2020, 4:23 PM  [facility] [address] [place] [place], [place], [address] Phone: [phone]   Fax:  [phone]  Name: KALEESI GUYTON MRN: [id number] Date of Birth: Jul 17, 1946

## 2020-08-02 ENCOUNTER — Ambulatory Visit (INDEPENDENT_AMBULATORY_CARE_PROVIDER_SITE_OTHER): Payer: Medicare HMO | Admitting: Physical Therapy

## 2020-08-02 ENCOUNTER — Encounter: Payer: Self-pay | Admitting: Physical Therapy

## 2020-08-02 ENCOUNTER — Other Ambulatory Visit: Payer: Self-pay

## 2020-08-02 DIAGNOSIS — M25552 Pain in left hip: Secondary | ICD-10-CM | POA: Diagnosis not present

## 2020-08-02 DIAGNOSIS — R29898 Other symptoms and signs involving the musculoskeletal system: Secondary | ICD-10-CM

## 2020-08-02 NOTE — Therapy (Signed)
[place], [place], [address] Phone: [phone]   Fax:  [phone]  Physical Therapy Treatment  Patient Details  Name: Kara Wheeler MRN: [id number] Date of Birth: March 25, 1946 Referring Provider (PT): Dr  Clearance Coots    Encounter Date: 08/02/2020   PT End of Session - 08/02/20 1017    Visit Number 5    Number of Visits 12    Date for PT Re-Evaluation 08/22/20    Authorization Type [employer]    Authorization Time Period 07/11/20-08/22/20    Authorization - Visit Number 12    PT Start Time 0935    PT Stop Time 1018    PT Time Calculation (min) 43 min           Past Medical History:  Diagnosis Date  . Ankle fracture, right 2013  . Coronary atherosclerosis of native coronary artery 2005   Taxus drug-eluting stent to treat severe stenosis in the proximal LAD  . HTN (hypertension)   . Mixed dyslipidemia   . Osteoarthritis     Past Surgical History:  Procedure Laterality Date  . CERVICAL CONE BIOPSY  1978   benign  . CORONARY STENT PLACEMENT  11/2003   LAD  . TUBAL LIGATION Bilateral 1980    There were no vitals filed for this visit.   Subjective Assessment - 08/02/20 0936    Subjective Mornings are still the worst. Pain is unchanged since last visit    Pertinent History Rt LB/hip pain treated in PT 2017; arthritis    Patient Stated Goals get rid of pain    Currently in Pain? No/denies    Pain Score 0-No pain    Pain Onset More than a month ago                             [facility] Adult PT Treatment/Exercise - 08/02/20 0937      Knee/Hip Exercises: Stretches   Quad Stretch Left;2 reps;30 seconds    Hip Flexor Stretch Left;2 reps;30 seconds    Piriformis Stretch Left;2 reps;30 seconds    Other Knee/Hip Stretches cat/cow x 10      Knee/Hip Exercises: Seated   Sit to [employer] 10 reps      Knee/Hip Exercises: Supine   Other Supine Knee/Hip Exercises TA sets 5  sec hold x 10    Other Supine Knee/Hip Exercises clamshells 3 x 10 red TB      Knee/Hip Exercises: Prone   Other Prone Exercises prone press up 3 x 10 with improved mobility and decreased pain after press ups      Manual Therapy   Manual Therapy Joint mobilization;Soft tissue mobilization    Joint Mobilization grade III-IV mobs to SI and L5    Soft tissue mobilization Lt piriformis/glute/ITB                       PT Long Term Goals - 07/11/20 1125      PT LONG TERM GOAL #1   Title Improve tissue extensibility Lt LE to palpation and with stretching    Time 6    Period Weeks    Status New    Target Date 08/22/20      PT LONG TERM GOAL #2   Title Eliminate morning pain Lt LE    Time 6    Period Weeks    Target Date 08/22/20  PT LONG TERM GOAL #3   Title Independent in HEP    Time 6    Period Weeks    Status New    Target Date 08/22/20      PT LONG TERM GOAL #4   Title Improve FOTO to </= 28% limitation    Time 6    Period Weeks    Status New    Target Date 08/22/20                 Plan - 08/02/20 1018    Clinical Impression Statement Pt reports pain mostly bothers her in the mornings. We reviewed HEP and took exercises from 14 to 10 to make HEP more managable. Pt responds well to prone press ups and cat cow exercises    Stability/Clinical Decision Making Stable/Uncomplicated    Rehab Potential Good    PT Frequency 2x / week    PT Duration 6 weeks    PT Treatment/Interventions ADLs/Self Care Home Management;Aquatic Therapy;Cryotherapy;Electrical Stimulation;Iontophoresis 4mg /ml Dexamethasone;Moist Heat;Ultrasound;Stair training;Gait training;Functional mobility training;Therapeutic activities;Balance training;Therapeutic exercise;Neuromuscular re-education;Patient/family education;Manual techniques;Passive range of motion;Dry needling;Taping    PT Next Visit Plan assess response to adjusted HEP. continue core strength    PT Home Exercise Plan  [id number]; [id number]           Patient will benefit from skilled therapeutic intervention in order to improve the following deficits and impairments:  Abnormal gait, Decreased range of motion, Increased fascial restricitons, Decreased activity tolerance, Pain, Impaired flexibility, Improper body mechanics, Decreased mobility, Postural dysfunction  Visit Diagnosis: Other symptoms and signs involving the musculoskeletal system  Pain in left hip     Problem List Patient Active Problem List   Diagnosis Date Noted  . Greater trochanteric pain syndrome of left lower extremity 04/30/2020  . Annual physical exam 04/02/2016  . Low back pain 03/27/2016  . PCP NOTES >>>>>>>>>>>>>>>> 01/16/2016  . Dyslipidemia 04/24/2010  . ESSENTIAL HYPERTENSION, BENIGN 04/24/2010  . CORONARY ATHEROSCLEROSIS NATIVE CORONARY ARTERY 04/24/2010   Amethyst Gainer, PT  Trachelle Low 08/02/2020, 10:25 AM  [facility] [place], [place], [address] Phone: [phone]   Fax:  [phone]  Name: Kara Wheeler MRN: [id number] Date of Birth: 24-Dec-1945

## 2020-08-07 ENCOUNTER — Ambulatory Visit: Payer: Medicare HMO | Admitting: Family Medicine

## 2020-08-08 ENCOUNTER — Encounter: Payer: Self-pay | Admitting: Family Medicine

## 2020-08-08 ENCOUNTER — Other Ambulatory Visit: Payer: Self-pay

## 2020-08-08 ENCOUNTER — Ambulatory Visit (INDEPENDENT_AMBULATORY_CARE_PROVIDER_SITE_OTHER): Payer: Medicare HMO | Admitting: Family Medicine

## 2020-08-08 ENCOUNTER — Ambulatory Visit: Payer: Self-pay

## 2020-08-08 DIAGNOSIS — M7918 Myalgia, other site: Secondary | ICD-10-CM

## 2020-08-08 MED ORDER — TRIAMCINOLONE ACETONIDE 40 MG/ML IJ SUSP
40.0000 mg | Freq: Once | INTRAMUSCULAR | Status: AC
Start: 2020-08-08 — End: 2020-08-08
  Administered 2020-08-08: 40 mg via INTRA_ARTICULAR

## 2020-08-08 NOTE — Patient Instructions (Signed)
Good to see you Please continue physical therapy  Please try heat   Please send me a message in MyChart with any questions or updates.  Please send me a message in a few days and let me know if your pain has improved or persists.   --Dr. Raeford Razor

## 2020-08-08 NOTE — Progress Notes (Signed)
Kara Wheeler - 74 y.o. female MRN [id number]  Date of birth: 13-Feb-1946  SUBJECTIVE:  Including CC & ROS.  Chief Complaint  Patient presents with  . Follow-up    Left hip    Kara Wheeler is a 74 y.o. female that is presenting with worsening of her left gluteal pain.  She still predominantly feels it when she first gets up in the morning.  The more active she is the pain improves.  It is localized to the left gluteus.  Denies any radicular symptoms.   Review of Systems See HPI   HISTORY: Past Medical, Surgical, Social, and Family History Reviewed & Updated per EMR.   Pertinent Historical Findings include:  Past Medical History:  Diagnosis Date  . Ankle fracture, right 2013  . Coronary atherosclerosis of native coronary artery 2005   Taxus drug-eluting stent to treat severe stenosis in the proximal LAD  . HTN (hypertension)   . Mixed dyslipidemia   . Osteoarthritis     Past Surgical History:  Procedure Laterality Date  . CERVICAL CONE BIOPSY  1978   benign  . CORONARY STENT PLACEMENT  11/2003   LAD  . TUBAL LIGATION Bilateral 1980    Family History  Problem Relation Age of Onset  . Heart attack Father 26       second at age 50  . Lymphoma Mother        Lymphoma  . Heart disease Brother   . Heart attack Paternal Grandmother   . Stroke Paternal Grandmother   . Heart attack Paternal Grandfather   . Stroke Paternal Grandfather   . Colon cancer Neg Hx   . Breast cancer Neg Hx   . Diabetes Neg Hx     Social History   Socioeconomic History  . Marital status: Married    Spouse name: Not on file  . Number of children: 1  . Years of education: Not on file  . Highest education level: Not on file  Occupational History  . 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 Smoking status: Former Smoker    Packs/day: 0.25    Years: 4.00    Pack years: 1.00    Types: Cigarettes    Start date: 08/26/1962    Quit date: 08/26/1966    Years  since quitting: 53.9  . Smokeless tobacco: Never Used  Substance and Sexual Activity  . Alcohol use: Yes    Comment: glass of  wine daily.  . Drug use: No    Comment: marijuana -- very little yrs ago  . Sexual activity: Yes    Partners: Male    Birth control/protection: Surgical, Post-menopausal    Comment: tubal  Other Topics Concern  . Not on file  Social History Narrative   Household-- pt and husband (Kara Wheeler)   Social Determinants of Health   Financial Resource Strain: Not on file  Food Insecurity: Not on file  Transportation Needs: No Transportation Needs  . Lack of Transportation (Medical): No  . Lack of Transportation (Non-Medical): No  Physical Activity: Not on file  Stress: Not on file  Social Connections: Not on file  Intimate Partner Violence: Not on file     PHYSICAL EXAM:  VS: BP (!) 144/83   Pulse 64   Ht 5\' 3"  (1.6 m)   Wt 150 lb (68 kg)   BMI 26.57 kg/m  Physical Exam Gen: NAD, alert, cooperative with exam, well-appearing MSK:  Left hip: Normal  range of motion. Normal strength resistance. Neurovascular intact   Aspiration/Injection Procedure Note Kara Wheeler 03-12-1946  Procedure: Injection Indications: Left hip pain  Procedure Details Consent: Risks of procedure as well as the alternatives and risks of each were explained to the (patient/caregiver).  Consent for procedure obtained. Time Out: Verified patient identification, verified procedure, site/side was marked, verified correct patient position, special equipment/implants available, medications/allergies/relevent history reviewed, required imaging and test results available.  Performed.  The area was cleaned with iodine and alcohol swabs.    The Left hip joint was injected using 3 cc of 1% lidocaine on a 22-gauge 3-1/2 inch needle.  The syringe was switched and a mixture containing 1 cc's of 40 mg Kenalog and 4 cc's of 0.25% bupivacaine was injected.  Ultrasound was used. Images  were obtained in long views showing the injection.     A sterile dressing was applied.  Patient did tolerate procedure well.     ASSESSMENT & PLAN:   Gluteal pain Pain still occurring in the posterior gluteus. Unclear if this is joint related or referred.  Predominantly occurs in the morning and does get relief with Tylenol and activity.  Seems more likely joint related.   - counseled on home exercise therapy and supportive care - continue PT - if improvement then would consider imaging of hip. If no improvement consider Si joint injection vs imaging of the lumbar spine.

## 2020-08-08 NOTE — Assessment & Plan Note (Addendum)
Pain still occurring in the posterior gluteus. Unclear if this is joint related or referred.  Predominantly occurs in the morning and does get relief with Tylenol and activity.  Seems more likely joint related.   - counseled on home exercise therapy and supportive care - continue PT - if improvement then would consider imaging of hip. If no improvement consider Si joint injection vs imaging of the lumbar spine.

## 2020-08-10 ENCOUNTER — Ambulatory Visit (INDEPENDENT_AMBULATORY_CARE_PROVIDER_SITE_OTHER): Payer: Medicare HMO | Admitting: Physical Therapy

## 2020-08-10 ENCOUNTER — Other Ambulatory Visit: Payer: Self-pay

## 2020-08-10 ENCOUNTER — Encounter: Payer: Self-pay | Admitting: Physical Therapy

## 2020-08-10 DIAGNOSIS — M25552 Pain in left hip: Secondary | ICD-10-CM

## 2020-08-10 DIAGNOSIS — R29898 Other symptoms and signs involving the musculoskeletal system: Secondary | ICD-10-CM | POA: Diagnosis not present

## 2020-08-10 NOTE — Therapy (Signed)
[place], [place], [address] Phone: [phone]   Fax:  [phone]  Physical Therapy Treatment  Patient Details  Name: Kara Wheeler MRN: [id number] Date of Birth: 04-27-1946 Referring Provider (PT): Dr  Clearance Coots    Encounter Date: 08/10/2020   PT End of Session - 08/10/20 1104    Visit Number 6    Number of Visits 12    Date for PT Re-Evaluation 08/22/20    Authorization Type [employer]    Authorization Time Period 07/11/20-08/22/20    Authorization - Visit Number 6    Authorization - Number of Visits 12    Progress Note Due on Visit 10    PT Start Time 1105    PT Stop Time 1143    PT Time Calculation (min) 38 min    Activity Tolerance Patient tolerated treatment well    Behavior During Therapy [facility] for tasks assessed/performed           Past Medical History:  Diagnosis Date  . Ankle fracture, right 2013  . Coronary atherosclerosis of native coronary artery 2005   Taxus drug-eluting stent to treat severe stenosis in the proximal LAD  . HTN (hypertension)   . Mixed dyslipidemia   . Osteoarthritis     Past Surgical History:  Procedure Laterality Date  . CERVICAL CONE BIOPSY  1978   benign  . CORONARY STENT PLACEMENT  11/2003   LAD  . TUBAL LIGATION Bilateral 1980    There were no vitals filed for this visit.   Subjective Assessment - 08/10/20 1108    Subjective Patient had an injection on Tuesday and reports decreased pain so far.    Patient Stated Goals get rid of pain    Currently in Pain? No/denies                             [facility] Adult PT Treatment/Exercise - 08/10/20 0001      Knee/Hip Exercises: Machines for Strengthening   Other Machine lat pull 2 plates x 10 with abs engaged      Knee/Hip Exercises: Standing   Other Standing Knee Exercises biceps curls 3# x 10; triceps x 10 with cues to engage abs      Knee/Hip Exercises: Seated    Long Arc Quad Both;5 reps    Long Arc Quad Limitations on dyna disc; then on swiss ball x 5 ea    Other Seated Knee/Hip Exercises on dyna disc lumbar flex/ext x 10, side to side x 10 then circles x 10 ea    Marching Both;10 reps    Marching Limitations dyna disc; then on swiss ball      Knee/Hip Exercises: Prone   Other Prone Exercises Quadriped: leg lift x 5 ea; then opposite arm/leg x 5 ea with cues to engage abs and ball placed on lumbar spine for stability                       PT Long Term Goals - 08/10/20 1138      PT LONG TERM GOAL #1   Title Improve tissue extensibility Lt LE to palpation and with stretching    Status Partially Met      PT LONG TERM GOAL #2   Title Eliminate morning pain Lt LE    Baseline relief from injection; also she has no difficulty with a 45 min nap  Status On-going      PT LONG TERM GOAL #3   Title Independent in HEP    Status Partially Met                 Plan - 08/10/20 1153    Clinical Impression Statement Patient presents today reportiing she had an injection 2 days ago and has had significant relief over the past 2 days. She still has some pain in the morning but it is much better. She reports that she went to [employer] yesterday and did her normal workout. Due to the injection, we did only exercises that did not aggravate the low back and held manual today. Patient has equal mobilty in left hip compared to right but still has pain posteriorly with ER stretching. LTGs are ongoing.    PT Frequency 2x / week    PT Duration 6 weeks    PT Treatment/Interventions ADLs/Self Care Home Management;Aquatic Therapy;Cryotherapy;Electrical Stimulation;Iontophoresis 81m/ml Dexamethasone;Moist Heat;Ultrasound;Stair training;Gait training;Functional mobility training;Therapeutic activities;Balance training;Therapeutic exercise;Neuromuscular re-education;Patient/family education;Manual techniques;Passive range of motion;Dry  needling;Taping    PT Next Visit Plan assess response to injection. continue core strength    PT Home Exercise Plan [id number]; [id number]    Consulted and Agree with Plan of Care Patient           Patient will benefit from skilled therapeutic intervention in order to improve the following deficits and impairments:  Abnormal gait,Decreased range of motion,Increased fascial restricitons,Decreased activity tolerance,Pain,Impaired flexibility,Improper body mechanics,Decreased mobility,Postural dysfunction  Visit Diagnosis: Other symptoms and signs involving the musculoskeletal system  Pain in left hip     Problem List Patient Active Problem List   Diagnosis Date Noted  . Gluteal pain 04/30/2020  . Annual physical exam 04/02/2016  . Low back pain 03/27/2016  . PCP NOTES >>>>>>>>>>>>>>>> 01/16/2016  . Dyslipidemia 04/24/2010  . ESSENTIAL HYPERTENSION, BENIGN 04/24/2010  . CORONARY ATHEROSCLEROSIS NATIVE CORONARY ARTERY 04/24/2010    Kara FlavorsPT 08/10/2020, 12:01 PM  [facility] [address]Phone: [phone]  Fax:  [phone] Name: Kara MCCAIGMRN: [id number] of Birth: 8November 04, 1947

## 2020-08-14 ENCOUNTER — Encounter: Payer: Self-pay | Admitting: Physical Therapy

## 2020-08-14 ENCOUNTER — Other Ambulatory Visit: Payer: Self-pay

## 2020-08-14 ENCOUNTER — Ambulatory Visit (INDEPENDENT_AMBULATORY_CARE_PROVIDER_SITE_OTHER): Payer: Medicare HMO | Admitting: Physical Therapy

## 2020-08-14 DIAGNOSIS — M25552 Pain in left hip: Secondary | ICD-10-CM | POA: Diagnosis not present

## 2020-08-14 DIAGNOSIS — R29898 Other symptoms and signs involving the musculoskeletal system: Secondary | ICD-10-CM | POA: Diagnosis not present

## 2020-08-14 NOTE — Therapy (Addendum)
[place], [place], [address] Phone: [phone]   Fax:  [phone]  Physical Therapy Treatment  Patient Details  Name: TARYNN GARLING MRN: [id number] Date of Birth: [birth year] Referring Provider (PT): Dr  Clearance Coots    Encounter Date: 08/14/2020   PT End of Session - 08/14/20 1059    Visit Number 7    Number of Visits 12    Date for PT Re-Evaluation 08/22/20    Authorization Type [employer]    Authorization Time Period 07/11/20-08/22/20    Authorization - Visit Number 7    Authorization - Number of Visits 12    Progress Note Due on Visit 10    PT Start Time 1100    PT Stop Time 1147    PT Time Calculation (min) 47 min    Activity Tolerance Patient tolerated treatment well    Behavior During Therapy [facility] for tasks assessed/performed           Past Medical History:  Diagnosis Date  . Ankle fracture, right 2013  . Coronary atherosclerosis of native coronary artery 2005   Taxus drug-eluting stent to treat severe stenosis in the proximal LAD  . HTN (hypertension)   . Mixed dyslipidemia   . Osteoarthritis     Past Surgical History:  Procedure Laterality Date  . CERVICAL CONE BIOPSY  1978   benign  . CORONARY STENT PLACEMENT  11/2003   LAD  . TUBAL LIGATION Bilateral 1980    There were no vitals filed for this visit.   Subjective Assessment - 08/14/20 1100    Subjective Patient reporting she still feels much better. No pain now but 2-3/10 overall. Better in the morning and at night than it has been.    Pertinent History Rt LB/hip pain treated in PT 2017; arthritis    Patient Stated Goals get rid of pain    Currently in Pain? No/denies                             [facility] Adult PT Treatment/Exercise - 08/14/20 0001      Knee/Hip Exercises: Stretches   Hip Flexor Stretch Left;3 reps;20 seconds    Hip Flexor Stretch Limitations seated standing and on step     Piriformis Stretch Left;1 rep;60 seconds    Piriformis Stretch Limitations seated    Other Knee/Hip Stretches cat/cow x 10 to childs pose x 1; Lower trunk rotation right with R foot on top of L knee 2x 30 sec; QL stretch in doorway x 20 sec; supine reverse "C" stretch for QL x 20 sec    Other Knee/Hip Stretches left hip ADD stretch butterfly 1x30 sec      Knee/Hip Exercises: Seated   Long Arc Quad 10 reps    Long Arc Quad Limitations on swiss ball x 5 ea    Marching Both;10 reps    Marching Limitations on swiss ball      Knee/Hip Exercises: Prone   Other Prone Exercises Quadriped: leg lift x 5 ea; then opposite arm/leg x 3 ea with cues to engage abs   limited by shoulders with arm/leg     Manual Therapy   Manual Therapy Muscle Energy Technique    Muscle Energy Technique to correct left post inominate                  PT Education - 08/14/20 1205    Education Details  HEP progressed (3 stretches added that she can pick from for QL)    Person(s) Educated Patient    Methods Explanation;Demonstration;Handout    Comprehension Verbalized understanding;Returned demonstration               PT Long Term Goals - 08/10/20 1138      PT LONG TERM GOAL #1   Title Improve tissue extensibility Lt LE to palpation and with stretching    Status Partially Met      PT LONG TERM GOAL #2   Title Eliminate morning pain Lt LE    Baseline relief from injection; also she has no difficulty with a 45 min nap    Status On-going      PT LONG TERM GOAL #3   Title Independent in HEP    Status Partially Met                 Plan - 08/14/20 1152    Clinical Impression Statement Patient continues to report improvements. She still has pain in left hip but significantly reduced in the morning and at night. Posturally pt stands in left lateral flexion, so we worked on stretching the left QL. Additionally she demonstrates a slight post rotation of the left inominate, so we used MET to correct  that. She reported the resisted hip flexion felt really good. She did very good today with LAQ on the swiss ball and with quadriped stabilization. She may benefit from pallof press and more core in standing or 1/2 kneel to progress.    PT Treatment/Interventions ADLs/Self Care Home Management;Aquatic Therapy;Cryotherapy;Electrical Stimulation;Iontophoresis 20m/ml Dexamethasone;Moist Heat;Ultrasound;Stair training;Gait training;Functional mobility training;Therapeutic activities;Balance training;Therapeutic exercise;Neuromuscular re-education;Patient/family education;Manual techniques;Passive range of motion;Dry needling;Taping    PT Next Visit Plan check for rotated inominate and correct if necessary; continue stretching left QL and normalizing standing posture. progress core/stab    PT Home Exercise Plan [email or web address]; [id number]           Patient will benefit from skilled therapeutic intervention in order to improve the following deficits and impairments:  Abnormal gait,Decreased range of motion,Increased fascial restricitons,Decreased activity tolerance,Pain,Impaired flexibility,Improper body mechanics,Decreased mobility,Postural dysfunction  Visit Diagnosis: Other symptoms and signs involving the musculoskeletal system  Pain in left hip     Problem List Patient Active Problem List   Diagnosis Date Noted  . Gluteal pain 04/30/2020  . Annual physical exam 04/02/2016  . Low back pain 03/27/2016  . PCP NOTES >>>>>>>>>>>>>>>> 01/16/2016  . Dyslipidemia 04/24/2010  . ESSENTIAL HYPERTENSION, BENIGN 04/24/2010  . CORONARY ATHEROSCLEROSIS NATIVE CORONARY ARTERY 04/24/2010    JMadelyn FlavorsPT 08/14/2020, 12:06 PM  [facility] [address]Phone: [phone]  Fax:  [phone] Name: MTAYAH IDROVOMRN: [id number] of Birth: [birth year]

## 2020-08-14 NOTE — Patient Instructions (Signed)
Access Code: [email or web address] URL: [email or web address] Date: 08/14/2020 Prepared by: Almyra Free  Exercises Prone Quadriceps Stretch with Strap - 2 x daily - 7 x weekly - 1 sets - 3 reps - 30 sec hold Hooklying Hamstring Stretch with Strap - 2 x daily - 7 x weekly - 1 sets - 3 reps - 30 sec hold Supine ITB Stretch with Strap - 2 x daily - 7 x weekly - 1 sets - 3 reps - 30 sec hold Supine Piriformis Stretch with Leg Straight - 2 x daily - 7 x weekly - 1 sets - 3 reps - 30 sec hold Hip Flexor Stretch at Edge of Bed - 2 x daily - 7 x weekly - 1 sets - 3 reps - 30 sec hold Prone Press Up - 3-4 x daily - 7 x weekly - 1-3 sets - 10 reps Supine Transversus Abdominis Bracing - Hands on Stomach - 2 x daily - 7 x weekly - 1 sets - 10 reps - 5 sec hold Cat-Camel - 2 x daily - 7 x weekly - 1 sets - 10 reps Supine Quadratus Lumborum Stretch - 2 x daily - 7 x weekly - 1 sets - 3 reps - 60 sec hold QL Stretch Supine - 1 x daily - 7 x weekly - 1 sets - 2 reps - 60 sec hold Standing Quadratus Lumborum Stretch with Doorway - 2 x daily - 7 x weekly - 1 sets - 2 reps - 60 sec hold Hip Flexor Stretch with Chair - 1 x daily - 7 x weekly - 1 sets - 2 reps - 30-60 sec hold

## 2020-08-17 ENCOUNTER — Ambulatory Visit (INDEPENDENT_AMBULATORY_CARE_PROVIDER_SITE_OTHER): Payer: Medicare HMO | Admitting: Rehabilitative and Restorative Service Providers"

## 2020-08-17 DIAGNOSIS — R29898 Other symptoms and signs involving the musculoskeletal system: Secondary | ICD-10-CM

## 2020-08-17 DIAGNOSIS — M25552 Pain in left hip: Secondary | ICD-10-CM

## 2020-08-17 NOTE — Therapy (Signed)
[place], [place], [address] Phone: [phone]   Fax:  [phone]  Physical Therapy Treatment  Patient Details  Name: Kara Wheeler MRN: [id number] Date of Birth: 17-Sep-1945 Referring Provider (PT): Dr  Clearance Coots    Encounter Date: 08/17/2020   PT End of Session - 08/17/20 0809    Visit Number 8    Number of Visits 12    Date for PT Re-Evaluation 08/22/20    Authorization Type [employer]    Authorization Time Period 07/11/20-08/22/20    Authorization - Visit Number 8    Authorization - Number of Visits 12    Progress Note Due on Visit 10    PT Start Time 0803    PT Stop Time 0845    PT Time Calculation (min) 42 min    Activity Tolerance Patient tolerated treatment well    Behavior During Therapy [facility] for tasks assessed/performed           Past Medical History:  Diagnosis Date  . Ankle fracture, right 2013  . Coronary atherosclerosis of native coronary artery 2005   Taxus drug-eluting stent to treat severe stenosis in the proximal LAD  . HTN (hypertension)   . Mixed dyslipidemia   . Osteoarthritis     Past Surgical History:  Procedure Laterality Date  . CERVICAL CONE BIOPSY  1978   benign  . CORONARY STENT PLACEMENT  11/2003   LAD  . TUBAL LIGATION Bilateral 1980    There were no vitals filed for this visit.   Subjective Assessment - 08/17/20 0806    Subjective The patient reports that she has improved since the injection.  She no longer has excruciating pain in the hip, just feels stiffness.    Pertinent History Rt LB/hip pain treated in PT 2017; arthritis    Patient Stated Goals get rid of pain    Currently in Pain? No/denies              [facility] PT Assessment - 08/17/20 0815      Assessment   Medical Diagnosis Lt greater trochanter pain     Referring Provider (PT) Dr  Clearance Coots     Onset Date/Surgical Date 09/27/19    Hand Dominance Right                          [facility] Adult PT Treatment/Exercise - 08/17/20 0815      Ambulation/Gait   Ambulation/Gait Yes      Exercises   Exercises Knee/Hip;Lumbar      Lumbar Exercises: Stretches   Active Hamstring Stretch Right;Left;1 rep;30 seconds    Double Knee to Chest Stretch 1 rep;60 seconds    Hip Flexor Stretch 1 rep    Hip Flexor Stretch Limitations Prone press up with one leg over edge of mat *reproduces pain at L SI/piriformis pain    Standing Side Bend Right;Left;1 rep;30 seconds    Press Ups 2 reps    Piriformis Stretch 1 rep;Right;Left;30 seconds    Other Lumbar Stretch Exercise standing lumbar lateral shift x 5 reps stretching L QL    Other Lumbar Stretch Exercise thomas test stretch      Lumbar Exercises: Standing   Functional Squats 10 reps    Functional Squats Limitations has extension of lumbar spine with activation of erector spinae    Wall Slides 15 reps;3 seconds    Wall Slides Limitations with cues for lumbar stabilization  Other Standing Lumbar Exercises marching and single leg stance engaging core musculature    Other Standing Lumbar Exercises standing marching leaning against physioball for core engagement and dynamic stability      Lumbar Exercises: Quadruped   [place] 5 reps    Opposite Arm/Leg Raise 5 reps;Right arm/Left leg;Left arm/Right leg      Knee/Hip Exercises: Standing   Lateral Step Up Right;Left;10 reps      Manual Therapy   Manual Therapy Muscle Energy Technique    Manual therapy comments for postural alignment    Muscle Energy Technique MET for L hip flexor engagement from thomas test position                  PT Education - 08/17/20 0906    Education Details HEP progression    Person(s) Educated Patient    Methods Explanation;Demonstration;Handout    Comprehension Verbalized understanding;Returned demonstration               PT Long Term Goals - 08/10/20 1138      PT LONG TERM GOAL #1   Title  Improve tissue extensibility Lt LE to palpation and with stretching    Status Partially Met      PT LONG TERM GOAL #2   Title Eliminate morning pain Lt LE    Baseline relief from injection; also she has no difficulty with a 45 min nap    Status On-going      PT LONG TERM GOAL #3   Title Independent in HEP    Status Partially Met                 Plan - 08/17/20 0907    Clinical Impression Statement The patient is continuing to note improvement in symptoms post injection.  She continues in therapy to have mild pain with standing squat that is improved with core engaged with wall slide.  She also notes some mild discomfort with bridge that improves with repetition.  PT plans to check LTGs next visit and discuss renewal versus d/c.    PT Treatment/Interventions ADLs/Self Care Home Management;Aquatic Therapy;Cryotherapy;Electrical Stimulation;Iontophoresis 82m/ml Dexamethasone;Moist Heat;Ultrasound;Stair training;Gait training;Functional mobility training;Therapeutic activities;Balance training;Therapeutic exercise;Neuromuscular re-education;Patient/family education;Manual techniques;Passive range of motion;Dry needling;Taping    PT Next Visit Plan *CHECK LONG TERM GOALS and determine need for renewal versus hold versus d/c; continue stretching left QL and normalizing standing posture. progress core/stab    PT Home Exercise Plan [email or web address]; [email or web address]           Patient will benefit from skilled therapeutic intervention in order to improve the following deficits and impairments:  Abnormal gait,Decreased range of motion,Increased fascial restricitons,Decreased activity tolerance,Pain,Impaired flexibility,Improper body mechanics,Decreased mobility,Postural dysfunction  Visit Diagnosis: Other symptoms and signs involving the musculoskeletal system  Pain in left hip     Problem List Patient Active Problem List   Diagnosis Date Noted  . Gluteal pain 04/30/2020  . Annual physical  exam 04/02/2016  . Low back pain 03/27/2016  . PCP NOTES >>>>>>>>>>>>>>>> 01/16/2016  . Dyslipidemia 04/24/2010  . ESSENTIAL HYPERTENSION, BENIGN 04/24/2010  . CORONARY ATHEROSCLEROSIS NATIVE CORONARY ARTERY 04/24/2010    WEphrata PT 08/17/2020, 9:09 AM  [facility] [address]Phone: [phone]  Fax:  [phone] Name: MAVERLEE SWARTZMRN: [id number] of Birth: [birth year]

## 2020-08-17 NOTE — Patient Instructions (Signed)
Access Code: [email or web address] URL: [email or web address] Date: 08/17/2020 Prepared by: Rudell Cobb  Exercises Prone Quadriceps Stretch with Strap - 2 x daily - 7 x weekly - 1 sets - 3 reps - 30 sec hold Hip Flexor Stretch at Edge of Bed - 2 x daily - 7 x weekly - 1 sets - 3 reps - 30 sec hold Prone Press Up - 3-4 x daily - 7 x weekly - 1-3 sets - 10 reps Supine Transversus Abdominis Bracing - Hands on Stomach - 2 x daily - 7 x weekly - 1 sets - 10 reps - 5 sec hold Cat-Camel - 2 x daily - 7 x weekly - 1 sets - 10 reps Supine Quadratus Lumborum Stretch - 2 x daily - 7 x weekly - 1 sets - 3 reps - 60 sec hold Standing Quadratus Lumborum Stretch with Doorway - 2 x daily - 7 x weekly - 1 sets - 2 reps - 60 sec hold Lateral Shift Correction at Wall - 2 x daily - 7 x weekly - 1 sets - 3 reps - 20 seconds hold Hip Flexor Stretch with Chair - 1 x daily - 7 x weekly - 1 sets - 2 reps - 30-60 sec hold Seated Hamstring Stretch with Chair - 2 x daily - 7 x weekly - 1 sets - 2 reps - 30 seconds hold Seated Piriformis Stretch with Trunk Bend - 2 x daily - 7 x weekly - 1 sets - 10 reps

## 2020-08-24 ENCOUNTER — Encounter: Payer: Self-pay | Admitting: Rehabilitative and Restorative Service Providers"

## 2020-08-24 ENCOUNTER — Ambulatory Visit (INDEPENDENT_AMBULATORY_CARE_PROVIDER_SITE_OTHER): Payer: Medicare HMO | Admitting: Rehabilitative and Restorative Service Providers"

## 2020-08-24 ENCOUNTER — Other Ambulatory Visit: Payer: Self-pay

## 2020-08-24 DIAGNOSIS — M25552 Pain in left hip: Secondary | ICD-10-CM

## 2020-08-24 DIAGNOSIS — R29898 Other symptoms and signs involving the musculoskeletal system: Secondary | ICD-10-CM

## 2020-08-24 NOTE — Addendum Note (Signed)
Addended by: Margretta Ditty M on: 08/24/2020 09:53 PM   Modules accepted: Orders

## 2020-08-24 NOTE — Patient Instructions (Signed)
Access Code: [email or web address] URL: [email or web address] Date: 08/24/2020 Prepared by: Margretta Ditty  Exercises Prone Quadriceps Stretch with Strap - 2 x daily - 7 x weekly - 1 sets - 3 reps - 30 sec hold Hip Flexor Stretch at Edge of Bed - 2 x daily - 7 x weekly - 1 sets - 3 reps - 30 sec hold Prone Press Up - 3-4 x daily - 7 x weekly - 1-3 sets - 10 reps Cat-Camel - 2 x daily - 7 x weekly - 1 sets - 10 reps Supine Quadratus Lumborum Stretch - 2 x daily - 7 x weekly - 1 sets - 3 reps - 60 sec hold Standing Quadratus Lumborum Stretch with Doorway - 2 x daily - 7 x weekly - 1 sets - 2 reps - 60 sec hold Lateral Shift Correction at Wall - 2 x daily - 7 x weekly - 1 sets - 3 reps - 20 seconds hold Seated Hamstring Stretch with Chair - 2 x daily - 7 x weekly - 1 sets - 2 reps - 30 seconds hold Seated Piriformis Stretch with Trunk Bend - 2 x daily - 7 x weekly - 1 sets - 10 reps Roller Massage Elongated IT Band Release - 2 x daily - 7 x weekly - 1 sets - 1 reps - 2 minutes hold

## 2020-08-24 NOTE — Therapy (Signed)
[place], [place], [address] Phone: [phone]   Fax:  [phone]  Physical Therapy Treatment and Renewal Summary  Patient Details  Name: Kara Wheeler MRN: [id number] Date of Birth: [birth year] Referring Provider (PT): Dr  Clearance Coots    Encounter Date: 08/24/2020   PT End of Session - 08/24/20 1536    Visit Number 9    Number of Visits 12    Date for PT Re-Evaluation 08/22/20    Authorization Type [employer]    Authorization Time Period 07/11/20-08/22/20    Authorization - Visit Number 9    Authorization - Number of Visits 12    Progress Note Due on Visit 10    PT Start Time 6962    PT Stop Time 1614    PT Time Calculation (min) 41 min    Activity Tolerance Patient tolerated treatment well    Behavior During Therapy [facility] for tasks assessed/performed           Past Medical History:  Diagnosis Date  . Ankle fracture, right 2013  . Coronary atherosclerosis of native coronary artery 2005   Taxus drug-eluting stent to treat severe stenosis in the proximal LAD  . HTN (hypertension)   . Mixed dyslipidemia   . Osteoarthritis     Past Surgical History:  Procedure Laterality Date  . CERVICAL CONE BIOPSY  1978   benign  . CORONARY STENT PLACEMENT  11/2003   LAD  . TUBAL LIGATION Bilateral 1980    There were no vitals filed for this visit.   Subjective Assessment - 08/24/20 1535    Subjective The patient reports her hip is feeling much better.    Pertinent History Rt LB/hip pain treated in PT 2017; arthritis    Patient Stated Goals get rid of pain    Currently in Pain? No/denies    Pain Radiating Towards has 4-5/10 pain in the mornings    Effect of Pain on Daily Activities no longer excruciating pain              [facility] PT Assessment - 08/24/20 1544      Assessment   Medical Diagnosis Lt greater trochanter pain     Referring Provider (PT) Dr  Clearance Coots     Onset  Date/Surgical Date 09/27/19                         [facility] Adult PT Treatment/Exercise - 08/24/20 1545      Exercises   Exercises Knee/Hip;Lumbar      Lumbar Exercises: Stretches   ITB Stretch Left;2 reps;30 seconds    ITB Stretch Limitations performed in supine and in sidelying    Other Lumbar Stretch Exercise also instructed in self mobilization of IT Band with muscle roller..      Lumbar Exercises: Aerobic   Nustep Level 4 x 4 minutes with LEs only      Manual Therapy   Manual Therapy Soft tissue mobilization    Manual therapy comments skilled palpation to assess tissue response to dry needling    Soft tissue mobilization L IT Band, quad and HS            Trigger Point Dry Needling - 08/24/20 0001    Consent Given? Yes    Education Handout Provided Previously provided    Muscles Treated Lower Quadrant Vastus lateralis;Hamstring    Other Dry Needling Left    Vastus lateralis Response Palpable  increased muscle length    Hamstring Response Palpable increased muscle length                PT Education - 08/24/20 1616    Education Details added self mobilization    Person(s) Educated Patient    Methods Explanation;Demonstration;Handout    Comprehension Verbalized understanding;Returned demonstration               PT Long Term Goals - 08/24/20 1537      PT LONG TERM GOAL #1   Title Improve tissue extensibility Lt LE to palpation and with stretching    Baseline continues with lateral hip and ITB tightness    Status Partially Met      PT LONG TERM GOAL #2   Title Eliminate morning pain Lt LE    Baseline She notices some mild stiffness in the morning and when getting up after sitting for an hour.  She feels like she moves slower the first few minutes after rising    Status Partially Met      PT LONG TERM GOAL #3   Title Independent in HEP    Status Partially Met      PT LONG TERM GOAL #4   Title Improve FOTO to </= 28% limitation     Baseline 14% limitation    Time 6    Period Weeks    Status Achieved          UPDATED LONG TERM GOALS:    PT Long Term Goals - 08/24/20 2150      PT LONG TERM GOAL #1   Title Independent with progression of HEP.    Time 6    Period Weeks    Status Revised    Target Date 10/05/20      PT LONG TERM GOAL #2   Title Eliminate morning pain Lt LE    Baseline She notices some mild stiffness in the morning and when getting up after sitting for an hour.  She feels like she moves slower the first few minutes after rising    Time 6    Period Weeks    Status Revised    Target Date 10/05/20                Plan - 08/24/20 2144    Clinical Impression Statement The patient has partially met LTGs and notes significant overall improvement in pain and mobility.  However, she continues to note stiffness that is worse in AM and after sitting for > 1 hour.  She also reported today some sensation that the L knee could buckle when ascending stairs.  PT further palpated L lateral hip and IT Band to look for 2 joint musculature due to sensation knee could buckle + hip discomfort and patient has myofascial shortening/trigger points along IT Band.  She responded well today to dry needling and PT added further IT Band stretching to plan.  Recommended we f/u in 10 days to re-assess progress and modify HEP as needed.    PT Treatment/Interventions ADLs/Self Care Home Management;Aquatic Therapy;Cryotherapy;Electrical Stimulation;Iontophoresis 28m/ml Dexamethasone;Moist Heat;Ultrasound;Stair training;Gait training;Functional mobility training;Therapeutic activities;Balance training;Therapeutic exercise;Neuromuscular re-education;Patient/family education;Manual techniques;Passive range of motion;Dry needling;Taping    PT Next Visit Plan Check HEP updates and STM of IT Band, continue DN as needed; check progress    PT Home Exercise Plan [email or web address]; [email or web address]    Consulted and Agree with Plan of Care Patient            Patient will benefit from  skilled therapeutic intervention in order to improve the following deficits and impairments:  Abnormal gait,Decreased range of motion,Increased fascial restricitons,Decreased activity tolerance,Pain,Impaired flexibility,Improper body mechanics,Decreased mobility,Postural dysfunction  Visit Diagnosis: Other symptoms and signs involving the musculoskeletal system  Pain in left hip     Problem List Patient Active Problem List   Diagnosis Date Noted  . Gluteal pain 04/30/2020  . Annual physical exam 04/02/2016  . Low back pain 03/27/2016  . PCP NOTES >>>>>>>>>>>>>>>> 01/16/2016  . Dyslipidemia 04/24/2010  . ESSENTIAL HYPERTENSION, BENIGN 04/24/2010  . CORONARY ATHEROSCLEROSIS NATIVE CORONARY ARTERY 04/24/2010    [place], PT 08/24/2020, 9:49 PM  [facility] [place], [place], [address] Phone: [phone]   Fax:  [phone]  Name: Kara Wheeler MRN: [id number] Date of Birth: Oct 19, 1945

## 2020-09-08 ENCOUNTER — Encounter: Payer: Self-pay | Admitting: Rehabilitative and Restorative Service Providers"

## 2020-09-08 ENCOUNTER — Other Ambulatory Visit: Payer: Self-pay

## 2020-09-08 ENCOUNTER — Ambulatory Visit (INDEPENDENT_AMBULATORY_CARE_PROVIDER_SITE_OTHER): Payer: Medicare HMO | Admitting: Rehabilitative and Restorative Service Providers"

## 2020-09-08 DIAGNOSIS — M25552 Pain in left hip: Secondary | ICD-10-CM

## 2020-09-08 DIAGNOSIS — R29898 Other symptoms and signs involving the musculoskeletal system: Secondary | ICD-10-CM | POA: Diagnosis not present

## 2020-09-08 NOTE — Patient Instructions (Signed)
Access Code: [email or web address] URL: [email or web address] Date: 09/08/2020 Prepared by: Rudell Cobb  Exercises Standing Quadratus Lumborum Stretch with Doorway - 2 x daily - 7 x weekly - 1 sets - 2 reps - 60 sec hold Seated Hamstring Stretch with Chair - 2 x daily - 7 x weekly - 1 sets - 2 reps - 30 seconds hold Seated Piriformis Stretch with Trunk Bend - 2 x daily - 7 x weekly - 1 sets - 10 reps Roller Massage Elongated IT Band Release - 2 x daily - 7 x weekly - 1 sets - 1 reps - 2 minutes hold Supine Quadratus Lumborum Stretch - 2 x daily - 7 x weekly - 1 sets - 3 reps - 60 sec hold Hip Flexor Stretch at Edge of Bed - 2 x daily - 7 x weekly - 1 sets - 3 reps - 30 sec hold Sidelying Hip Abduction - 2 x daily - 7 x weekly - 3 sets - 12 reps Prone Press Up - 3-4 x daily - 7 x weekly - 1-3 sets - 10 reps Cat-Camel - 2 x daily - 7 x weekly - 1 sets - 10 reps

## 2020-09-08 NOTE — Therapy (Addendum)
[facility] [place], [place], [address] Phone: [phone]   Fax:  [phone]  Physical Therapy Treatment / Progress Note and Discharge Summary  Patient Details  Name: Kara Wheeler MRN: [id number] Date of Birth: 1945-10-13 Referring Provider (PT): Dr  Clearance Coots   Physical Therapy Progress Note   Dates of Reporting Period:07/11/20 to 09/08/20   Objective Measurements: see below for patient goal status  Reason Skilled Services are Required: on hold-- patient to continue progressing community exercise and home exercise plan  Thank you for the referral of this patient.     Encounter Date: 09/08/2020   PT End of Session - 09/08/20 1419    Visit Number 10    Number of Visits 12    Date for PT Re-Evaluation 08/22/20    Authorization Type [employer]    Authorization Time Period 07/11/20-08/22/20    Authorization - Visit Number 10    Authorization - Number of Visits 12    PT Start Time 0786    PT Stop Time 1445    PT Time Calculation (min) 43 min    Activity Tolerance Patient tolerated treatment well    Behavior During Therapy WFL for tasks assessed/performed           Past Medical History:  Diagnosis Date  . Ankle fracture, right 2013  . Coronary atherosclerosis of native coronary artery 2005   Taxus drug-eluting stent to treat severe stenosis in the proximal LAD  . HTN (hypertension)   . Mixed dyslipidemia   . Osteoarthritis     Past Surgical History:  Procedure Laterality Date  . CERVICAL CONE BIOPSY  1978   benign  . CORONARY STENT PLACEMENT  11/2003   LAD  . TUBAL LIGATION Bilateral 1980    There were no vitals filed for this visit.   Subjective Assessment - 09/08/20 1407    Subjective The patient is noting pain upon waking in the  morning in the L glut medius region.  She did not note a big change after DN last session.  If she sits for longer periods, she can note increased  pain upon rising.    Pertinent History Rt LB/hip pain treated in PT 2017; arthritis    Patient Stated Goals get rid of pain    Currently in Pain? No/denies    Aggravating Factors  None at rest:  Only notes first thing in the morning, after sitting for long periods, or with forward flexion.              [facility] PT Assessment - 09/08/20 1414      Assessment   Medical Diagnosis Lt greater trochanter pain     Referring Provider (PT) Dr  Clearance Coots     Onset Date/Surgical Date 09/27/19    Hand Dominance Right                         [facility] Adult PT Treatment/Exercise - 09/08/20 1414      Exercises   Exercises Knee/Hip;Lumbar      Lumbar Exercises: Stretches   Prone on Elbows Stretch 2 reps;30 seconds    Other Lumbar Stretch Exercise QL stretch seated      Lumbar Exercises: Aerobic   Tread Mill 1.6 mph at 3% incline x 3 minutes      Lumbar Exercises: Standing   Other Standing Lumbar Exercises marching and single leg stance engaging core musculature    Other  Standing Lumbar Exercises side stepping holding yoga block over head for core engagement.      Lumbar Exercises: Supine   Other Supine Lumbar Exercises resisted hip flexion x 5 seconds x 5 reps R and L sides      Lumbar Exercises: Sidelying   Hip Abduction Left;10 reps    Hip Abduction Weights (lbs) 2 lbs x 2 sets    Other Sidelying Lumbar Exercises quadratus lumborum hip hike/depression with foot on ball x 10 reps      Knee/Hip Exercises: Standing   Heel Raises Right;Left;10 reps    Heel Raises Limitations bilateral heel raises easy, therefore performed unilateral heel raise    Lateral Step Up Right;Left;10 reps      Manual Therapy   Manual Therapy Joint mobilization;Soft tissue mobilization    Manual therapy comments to improve mobility    Joint Mobilization SI compression central PA grade II    Soft tissue mobilization L sacral and glut med STM; IT Band STM                  PT Education  - 09/08/20 1623    Education Details HEP    Person(s) Educated Patient    Methods Explanation;Demonstration;Handout    Comprehension Verbalized understanding;Returned demonstration               PT Long Term Goals - 09/08/20 1626      PT LONG TERM GOAL #1   Title Independent with progression of HEP.    Time 6    Period Weeks    Status Achieved      PT LONG TERM GOAL #2   Title Eliminate morning pain Lt LE    Baseline She notices some mild stiffness in the morning and when getting up after sitting for an hour.  She feels like she moves slower the first few minutes after rising    Time 6    Period Weeks    Status Revised                 Plan - 09/08/20 1455    Clinical Impression Statement The patient is only noting symptoms in the morning or when sitting for > 1 hour.  She is working out with silver sneakers and participating in [place].  Patient plans to continue current program noting that severe pain is gone.  PT encouraged her to f/u with [place] in the next month if any questions related ot HEP or with her physician if she feels pain continues, does not resolve.    PT Treatment/Interventions ADLs/Self Care Home Management;Aquatic Therapy;Cryotherapy;Electrical Stimulation;Iontophoresis 76m/ml Dexamethasone;Moist Heat;Ultrasound;Stair training;Gait training;Functional mobility training;Therapeutic activities;Balance training;Therapeutic exercise;Neuromuscular re-education;Patient/family education;Manual techniques;Passive range of motion;Dry needling;Taping    PT Next Visit Plan *10th visit On Hold today 09/08/20    PT Home Exercise Plan [id number]; [id number]    Consulted and Agree with Plan of Care Patient           Patient will benefit from skilled therapeutic intervention in order to improve the following deficits and impairments:  Abnormal gait,Decreased range of motion,Increased fascial restricitons,Decreased activity tolerance,Pain,Impaired flexibility,Improper body  mechanics,Decreased mobility,Postural dysfunction  Visit Diagnosis: Pain in left hip  Other symptoms and signs involving the musculoskeletal system     Problem List Patient Active Problem List   Diagnosis Date Noted  . Gluteal pain 04/30/2020  . Annual physical exam 04/02/2016  . Low back pain 03/27/2016  . PCP NOTES >>>>>>>>>>>>>>>> 01/16/2016  . Dyslipidemia 04/24/2010  . ESSENTIAL  HYPERTENSION, BENIGN 04/24/2010  . CORONARY ATHEROSCLEROSIS NATIVE CORONARY ARTERY 04/24/2010    PHYSICAL THERAPY DISCHARGE SUMMARY  Visits from Start of Care: 10  Current functional level related to goals / functional outcomes: See above   Remaining deficits: See note above for most recent known status   Education / Equipment: Home program  Plan: Patient agrees to discharge.  Patient goals were partially met. Patient is being discharged due to meeting the stated rehab goals.  ?????        Thank you for the referral of this patient. Rudell Cobb, MPT  [place] 09/08/2020, 4:27 PM  [facility] [place], [place], [address] Phone: [phone]   Fax:  [phone]  Name: Kara Wheeler MRN: [id number] Date of Birth: 1945/10/01

## 2020-09-12 DIAGNOSIS — Z20822 Contact with and (suspected) exposure to covid-19: Secondary | ICD-10-CM | POA: Diagnosis not present

## 2020-10-23 DIAGNOSIS — H524 Presbyopia: Secondary | ICD-10-CM | POA: Diagnosis not present

## 2021-03-01 ENCOUNTER — Other Ambulatory Visit: Payer: Self-pay | Admitting: Internal Medicine

## 2021-03-10 DIAGNOSIS — I10 Essential (primary) hypertension: Secondary | ICD-10-CM | POA: Diagnosis not present

## 2021-03-10 DIAGNOSIS — J101 Influenza due to other identified influenza virus with other respiratory manifestations: Secondary | ICD-10-CM | POA: Diagnosis not present

## 2021-03-10 DIAGNOSIS — U071 COVID-19: Secondary | ICD-10-CM | POA: Diagnosis not present

## 2021-03-14 ENCOUNTER — Encounter: Payer: Medicare HMO | Admitting: Internal Medicine

## 2021-03-21 ENCOUNTER — Telehealth: Payer: Self-pay | Admitting: Internal Medicine

## 2021-03-21 NOTE — Telephone Encounter (Signed)
Copied from [place] [id number]. Topic: Medicare AWV >> Mar 21, 2021 11:13 AM Harris-Coley, Hannah Beat wrote: Reason for CRM: Left message for patient to schedule Annual Wellness Visit.  Please schedule with Health Nurse Advisor Augustine Radar. at [facility].

## 2021-04-04 ENCOUNTER — Ambulatory Visit (INDEPENDENT_AMBULATORY_CARE_PROVIDER_SITE_OTHER): Payer: Medicare HMO

## 2021-04-04 ENCOUNTER — Encounter: Payer: Self-pay | Admitting: Internal Medicine

## 2021-04-04 ENCOUNTER — Other Ambulatory Visit: Payer: Self-pay

## 2021-04-04 ENCOUNTER — Ambulatory Visit (INDEPENDENT_AMBULATORY_CARE_PROVIDER_SITE_OTHER): Payer: Medicare HMO | Admitting: Internal Medicine

## 2021-04-04 VITALS — BP 122/72 | HR 88 | Temp 97.9°F | Resp 16 | Ht 63.0 in | Wt 139.0 lb

## 2021-04-04 VITALS — BP 122/72 | HR 88 | Temp 97.9°F | Resp 16 | Ht 63.0 in | Wt 139.4 lb

## 2021-04-04 DIAGNOSIS — E785 Hyperlipidemia, unspecified: Secondary | ICD-10-CM

## 2021-04-04 DIAGNOSIS — Z0000 Encounter for general adult medical examination without abnormal findings: Secondary | ICD-10-CM

## 2021-04-04 DIAGNOSIS — R739 Hyperglycemia, unspecified: Secondary | ICD-10-CM | POA: Diagnosis not present

## 2021-04-04 DIAGNOSIS — Z800 Family history of malignant neoplasm of digestive organs: Secondary | ICD-10-CM

## 2021-04-04 NOTE — Progress Notes (Signed)
Subjective:    Patient ID: Kara Wheeler, female    DOB: May 19, 1946, 75 y.o.   MRN: [id number]  DOS:  04/04/2021 Type of visit - description: CPX  Since the last office visit, is doing much better with diet, + weight loss. Had hip pain, that resolved with physical therapy.  Wt Readings from Last 3 Encounters:  04/04/21 139 lb (63 kg)  04/04/21 139 lb 6 oz (63.2 kg)  08/08/20 150 lb (68 kg)     Review of Systems  Other than above, a 14 point review of systems is negative      Past Medical History:  Diagnosis Date   Ankle fracture, right 2013   Coronary atherosclerosis of native coronary artery 2005   Taxus drug-eluting stent to treat severe stenosis in the proximal LAD   HTN (hypertension)    Mixed dyslipidemia    Osteoarthritis     Past Surgical History:  Procedure Laterality Date   CERVICAL CONE BIOPSY  1978   benign   CORONARY STENT PLACEMENT  11/2003   LAD   TUBAL LIGATION Bilateral 1980   Social History   Socioeconomic History   Marital status: Married    Spouse name: Not on file   Number of children: 1   Years of education: Not on file   Highest education level: Not on file  Occupational History   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 [employer]  Tobacco Use   Smoking status: Former    Packs/day: 0.25    Years: 4.00    Pack years: 1.00    Types: Cigarettes    Start date: 08/26/1962    Quit date: 08/26/1966    Years since quitting: 54.6   Smokeless tobacco: Never  Substance and Sexual Activity   Alcohol use: Yes    Comment: glass of  wine rarely   Drug use: No    Comment: marijuana -- very little yrs ago   Sexual activity: Yes    Partners: Male    Birth control/protection: Surgical, Post-menopausal    Comment: tubal  Other Topics Concern   Not on file  Social History Narrative   Household-- pt and husband (Mr Whitingham)   Social Determinants of Health   Financial Resource Strain: Low Risk    Difficulty of Paying Living Expenses:  Not hard at all  Food Insecurity: No Food Insecurity   Worried About Charity fundraiser in the Last Year: Never true   [place] in the Last Year: Never true  Transportation Needs: No Transportation Needs   Lack of Transportation (Medical): No   Lack of Transportation (Non-Medical): No  Physical Activity: Sufficiently Active   Days of Exercise per Week: 5 days   Minutes of Exercise per Session: 40 min  Stress: No Stress Concern Present   Feeling of Stress : Not at all  Social Connections: Moderately Isolated   Frequency of Communication with Friends and Family: More than three times a week   Frequency of Social Gatherings with Friends and Family: More than three times a week   Attends Religious Services: Never   Marine scientist or Organizations: No   Attends Archivist Meetings: Never   Marital Status: Married  Human resources officer Violence: Not At Risk   Fear of Current or Ex-Partner: No   Emotionally Abused: No   Physically Abused: No   Sexually Abused: No    Allergies as of 04/04/2021   No Known Allergies  Medication List        Accurate as of April 04, 2021 11:59 PM. If you have any questions, ask your nurse or doctor.          STOP taking these medications    naproxen 500 MG tablet Commonly known as: NAPROSYN Stopped by: Kathlene November, MD       TAKE these medications    aspirin 81 MG tablet Take 81 mg by mouth daily.   losartan 50 MG tablet Commonly known as: COZAAR Take 1 tablet (50 mg total) by mouth daily.   Multi Vitamin Tabs Take 1 tablet by mouth daily.   rosuvastatin 20 MG tablet Commonly known as: CRESTOR Take 1 tablet (20 mg total) by mouth daily.   VITAMIN D-3 PO Take 500 Units by mouth daily.           Objective:   Physical Exam BP 122/72 (BP Location: Left Arm, Patient Position: Sitting, Cuff Size: Small)   Pulse 88   Temp 97.9 F (36.6 C) (Oral)   Resp 16   Ht '5\' 3"'$  (1.6 m)   Wt 139 lb 6 oz (63.2  kg)   SpO2 99%   BMI 24.69 kg/m  General: Well developed, NAD, BMI noted Neck: No  thyromegaly  HEENT:  Normocephalic . Face symmetric, atraumatic Lungs:  CTA B Normal respiratory effort, no intercostal retractions, no accessory muscle use. Heart: RRR,  no murmur.  Abdomen:  Not distended, soft, non-tender. No rebound or rigidity.   Lower extremities: no pretibial edema bilaterally  Skin: Exposed areas without rash. Not pale. Not jaundice Neurologic:  alert & oriented X3.  Speech normal, gait appropriate for age and unassisted Strength symmetric and appropriate for age.  Psych: Cognition and judgment appear intact.  Cooperative with normal attention span and concentration.  Behavior appropriate. No anxious or depressed appearing.     Assessment     Assessment New pt 09-2015 Hyper glycemia A1c 6.0 (02-2017) HTN Dyslipidemia Elevated LFTs per chart review: AST slightly high, normal ALT CV: Dr Burt Knack  CAD dx 2005, was asx >> stent  H/o false  Positive stress-EKG Myoview 2013 negative.  stress echo normal 11-2014 Normal echo stress test 08-2017 H/o abnormal PAP, cone bx (remotely) H/o Fx distal fibula -R- 2013, no surgery  PLAN Here for CPX Hyperglycemia: Doing great with lifestyle, + weight loss, checking labs HTN: BP is very good, on losartan, checking labs High cholesterol: On Crestor, checking labs CAD: Asymptomatic.  Has not seen cardiology recently. Hip pain: Was seen by Ortho and physical therapy, doing better. Hand shakiness: Reports occasionally R>L hand shakiness, exam today is negative.  Reassess in 1 year RTC labs in few days RTC CPX 1 year     This visit occurred during the SARS-CoV-2 public health emergency.  Safety protocols were in place, including screening questions prior to the visit, additional usage of staff PPE, and extensive cleaning of exam room while observing appropriate contact time as indicated for disinfecting solutions.

## 2021-04-04 NOTE — Patient Instructions (Signed)
Kara Wheeler , Thank you for taking time to come for your Medicare Wellness Visit. I appreciate your ongoing commitment to your health goals. Please review the following plan we discussed and let me know if I can assist you in the future.   Screening recommendations/referrals: Colonoscopy: Referral placed today by PCP Mammogram: Completed 04/28/2020-Due 04/28/2021 Bone Density: Completed 06/29/2020-Due 06/29/2022 Recommended yearly ophthalmology/optometry visit for glaucoma screening and checkup Recommended yearly dental visit for hygiene and checkup  Vaccinations: Influenza vaccine: Due 04/2021 Pneumococcal vaccine: Up to date Tdap vaccine: Up to date-Due-03/06/2027 Shingles vaccine: Completed vaccines   Covid-19:Up to date  Advanced directives: Declined information today  Conditions/risks identified: See problem list  Next appointment: Follow up in one year for your annual wellness visit 04/08/2022 @ 3:00   Preventive Care 74 Years and Older, Female Preventive care refers to lifestyle choices and visits with your health care provider that can promote health and wellness. What does preventive care include? A yearly physical exam. This is also called an annual well check. Dental exams once or twice a year. Routine eye exams. Ask your health care provider how often you should have your eyes checked. Personal lifestyle choices, including: Daily care of your teeth and gums. Regular physical activity. Eating a healthy diet. Avoiding tobacco and drug use. Limiting alcohol use. Practicing safe sex. Taking low-dose aspirin every day. Taking vitamin and mineral supplements as recommended by your health care provider. What happens during an annual well check? The services and screenings done by your health care provider during your annual well check will depend on your age, overall health, lifestyle risk factors, and family history of disease. Counseling  Your health care provider may ask you  questions about your: Alcohol use. Tobacco use. Drug use. Emotional well-being. Home and relationship well-being. Sexual activity. Eating habits. History of falls. Memory and ability to understand (cognition). Work and work Statistician. Reproductive health. Screening  You may have the following tests or measurements: Height, weight, and BMI. Blood pressure. Lipid and cholesterol levels. These may be checked every 5 years, or more frequently if you are over 82 years old. Skin check. Lung cancer screening. You may have this screening every year starting at age 12 if you have a 30-pack-year history of smoking and currently smoke or have quit within the past 15 years. Fecal occult blood test (FOBT) of the stool. You may have this test every year starting at age 62. Flexible sigmoidoscopy or colonoscopy. You may have a sigmoidoscopy every 5 years or a colonoscopy every 10 years starting at age 43. Hepatitis C blood test. Hepatitis B blood test. Sexually transmitted disease (STD) testing. Diabetes screening. This is done by checking your blood sugar (glucose) after you have not eaten for a while (fasting). You may have this done every 1-3 years. Bone density scan. This is done to screen for osteoporosis. You may have this done starting at age 22. Mammogram. This may be done every 1-2 years. Talk to your health care provider about how often you should have regular mammograms. Talk with your health care provider about your test results, treatment options, and if necessary, the need for more tests. Vaccines  Your health care provider may recommend certain vaccines, such as: Influenza vaccine. This is recommended every year. Tetanus, diphtheria, and acellular pertussis (Tdap, Td) vaccine. You may need a Td booster every 10 years. Zoster vaccine. You may need this after age 63. Pneumococcal 13-valent conjugate (PCV13) vaccine. One dose is recommended after age 74. Pneumococcal  polysaccharide  (PPSV23) vaccine. One dose is recommended after age 74. Talk to your health care provider about which screenings and vaccines you need and how often you need them. This information is not intended to replace advice given to you by your health care provider. Make sure you discuss any questions you have with your health care provider. Document Released: 09/08/2015 Document Revised: 05/01/2016 Document Reviewed: 06/13/2015 Elsevier Interactive Patient Education  2017 [place] Prevention in the Home Falls can cause injuries. They can happen to people of all ages. There are many things you can do to make your home safe and to help prevent falls. What can I do on the outside of my home? Regularly fix the edges of walkways and driveways and fix any cracks. Remove anything that might make you trip as you walk through a door, such as a raised step or threshold. Trim any bushes or trees on the path to your home. Use bright outdoor lighting. Clear any walking paths of anything that might make someone trip, such as rocks or tools. Regularly check to see if handrails are loose or broken. Make sure that both sides of any steps have handrails. Any raised decks and porches should have guardrails on the edges. Have any leaves, snow, or ice cleared regularly. Use sand or salt on walking paths during winter. Clean up any spills in your garage right away. This includes oil or grease spills. What can I do in the bathroom? Use night lights. Install grab bars by the toilet and in the tub and shower. Do not use towel bars as grab bars. Use non-skid mats or decals in the tub or shower. If you need to sit down in the shower, use a plastic, non-slip stool. Keep the floor dry. Clean up any water that spills on the floor as soon as it happens. Remove soap buildup in the tub or shower regularly. Attach bath mats securely with double-sided non-slip rug tape. Do not have throw rugs and other things on the  floor that can make you trip. What can I do in the bedroom? Use night lights. Make sure that you have a light by your bed that is easy to reach. Do not use any sheets or blankets that are too big for your bed. They should not hang down onto the floor. Have a firm chair that has side arms. You can use this for support while you get dressed. Do not have throw rugs and other things on the floor that can make you trip. What can I do in the kitchen? Clean up any spills right away. Avoid walking on wet floors. Keep items that you use a lot in easy-to-reach places. If you need to reach something above you, use a strong step stool that has a grab bar. Keep electrical cords out of the way. Do not use floor polish or wax that makes floors slippery. If you must use wax, use non-skid floor wax. Do not have throw rugs and other things on the floor that can make you trip. What can I do with my stairs? Do not leave any items on the stairs. Make sure that there are handrails on both sides of the stairs and use them. Fix handrails that are broken or loose. Make sure that handrails are as long as the stairways. Check any carpeting to make sure that it is firmly attached to the stairs. Fix any carpet that is loose or worn. Avoid having throw rugs at the top  or bottom of the stairs. If you do have throw rugs, attach them to the floor with carpet tape. Make sure that you have a light switch at the top of the stairs and the bottom of the stairs. If you do not have them, ask someone to add them for you. What else can I do to help prevent falls? Wear shoes that: Do not have high heels. Have rubber bottoms. Are comfortable and fit you well. Are closed at the toe. Do not wear sandals. If you use a stepladder: Make sure that it is fully opened. Do not climb a closed stepladder. Make sure that both sides of the stepladder are locked into place. Ask someone to hold it for you, if possible. Clearly mark and make  sure that you can see: Any grab bars or handrails. First and last steps. Where the edge of each step is. Use tools that help you move around (mobility aids) if they are needed. These include: Canes. Walkers. Scooters. Crutches. Turn on the lights when you go into a dark area. Replace any light bulbs as soon as they burn out. Set up your furniture so you have a clear path. Avoid moving your furniture around. If any of your floors are uneven, fix them. If there are any pets around you, be aware of where they are. Review your medicines with your doctor. Some medicines can make you feel dizzy. This can increase your chance of falling. Ask your doctor what other things that you can do to help prevent falls. This information is not intended to replace advice given to you by your health care provider. Make sure you discuss any questions you have with your health care provider. Document Released: 06/08/2009 Document Revised: 01/18/2016 Document Reviewed: 09/16/2014 Elsevier Interactive Patient Education  2017 [employer].

## 2021-04-04 NOTE — Patient Instructions (Signed)
Check the  blood pressure   BP GOAL is between 110/65 and  135/85. If it is consistently higher or lower, let me know     GO TO THE LAB : Get the blood work     [place], [place] back for blood work in the next few days, fasting  Come back for a physical exam in 1 year     "Living will", "[place] of attorney": Advanced care planning  (If you already have a living will or healthcare power of attorney, please bring the copy to be scanned in your chart.)  Advance care planning is a process that supports adults in  understanding and sharing their preferences regarding future medical care.   The patient's preferences are recorded in documents called Advance Directives.    Advanced directives are completed (and can be modified at any time) while the patient is in full mental capacity.   The documentation should be available at all times to the patient, the family and the healthcare providers.  Bring in a copy to be scanned in your chart is an excellent idea and is recommended   This legal documents direct treatment decision making and/or appoint a surrogate to make the decision if the patient is not capable to do so.    Advance directives can be documented in many types of formats,  documents have names such as:  Lliving will  Durable power of attorney for healthcare (healthcare proxy or healthcare power of attorney)  Combined directives  Physician orders for life-sustaining treatment    More information at:  [email or web address]

## 2021-04-04 NOTE — Progress Notes (Addendum)
Subjective:   ZARAHI FOOR is a 75 y.o. female who presents for Medicare Annual (Subsequent) preventive examination.   Review of Systems     Cardiac Risk Factors include: advanced age (>28mn, >>29women);dyslipidemia;hypertension     Objective:    Today's Vitals   04/04/21 1450  BP: 122/72  Pulse: 88  Resp: 16  Temp: 97.9 F (36.6 C)  SpO2: 99%  Weight: 139 lb (63 kg)  Height: '5\' 3"'$  (1.6 m)   Body mass index is 24.62 kg/m.  Advanced Directives 04/04/2021 07/11/2020 03/07/2020 02/17/2018 01/28/2017 01/24/2016  Does Patient Have a Medical Advance Directive? No Yes Yes No No No  Type of Advance [place]ive [place] - - -  Does patient want to make changes to medical advance directive? - - No - Patient declined - - -  Copy of [place]? - No - copy requested No - copy requested - - -  Would patient like information on creating a medical advance directive? No - Patient declined - - Yes (MAU/Ambulatory/Procedural Areas - Information given) Yes (MAU/Ambulatory/Procedural Areas - Information given) No - patient declined information    Current Medications (verified) Outpatient Encounter Medications as of 04/04/2021  Medication Sig   aspirin 81 MG tablet Take 81 mg by mouth daily.     Cholecalciferol (VITAMIN D-3 PO) Take 500 Units by mouth daily.    losartan (COZAAR) 50 MG tablet Take 1 tablet (50 mg total) by mouth daily.   Multiple Vitamin (MULTI VITAMIN) TABS Take 1 tablet by mouth daily.   rosuvastatin (CRESTOR) 20 MG tablet Take 1 tablet (20 mg total) by mouth daily.   [DISCONTINUED] naproxen (NAPROSYN) 500 MG tablet Take 1 tablet (500 mg total) by mouth at bedtime as needed.   No facility-administered encounter medications on file as of 04/04/2021.    Allergies (verified) Patient has no known allergies.   History: Past Medical History:  Diagnosis Date   Ankle  fracture, right 2013   Coronary atherosclerosis of native coronary artery 2005   Taxus drug-eluting stent to treat severe stenosis in the proximal LAD   HTN (hypertension)    Mixed dyslipidemia    Osteoarthritis    Past Surgical History:  Procedure Laterality Date   CERVICAL CONE BIOPSY  1978   benign   CORONARY STENT PLACEMENT  11/2003   LAD   TUBAL LIGATION Bilateral 1980   Family History  Problem Relation Age of Onset   Lymphoma Mother        Lymphoma   Heart attack Father 473      second at age 74  Heart disease Brother    Colon cancer Brother 694  Atrial fibrillation Brother    Heart attack Paternal Grandmother    Stroke Paternal Grandmother    Heart attack Paternal Grandfather    Stroke Paternal Grandfather    Breast cancer Neg Hx    Diabetes Neg Hx    Social History   Socioeconomic History   Marital status: Married    Spouse name: Not on file   Number of children: 1   Years of education: Not on file   Highest education level: Not on file  Occupational History   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, maintenance (IT) Tobacco Use   Smoking status: Former    Packs/day: 0.25    Years: 4.00    Pack years: 1.00  Types: Cigarettes    Start date: 08/26/1962    Quit date: 08/26/1966    Years since quitting: 54.6   Smokeless tobacco: Never  Substance and Sexual Activity   Alcohol use: Yes    Comment: glass of  wine rarely   Drug use: No    Comment: marijuana -- very little yrs ago   Sexual activity: Yes    Partners: Male    Birth control/protection: Surgical, Post-menopausal    Comment: tubal  Other Topics Concern   Not on file  Social History Narrative   Household-- pt and husband (Mr [place])   Social Determinants of Health   Financial Resource Strain: Low Risk    Difficulty of Paying Living Expenses: Not hard at all  Food Insecurity: No Food Insecurity   Worried About Charity fundraiser in the Last Year: Never true   Ran Out of Food in the Last  Year: Never true  Transportation Needs: No Transportation Needs   Lack of Transportation (Medical): No   Lack of Transportation (Non-Medical): No  Physical Activity: Sufficiently Active   Days of Exercise per Week: 5 days   Minutes of Exercise per Session: 40 min  Stress: No Stress Concern Present   Feeling of Stress : Not at all  Social Connections: Moderately Isolated   Frequency of Communication with Friends and Family: More than three times a week   Frequency of Social Gatherings with Friends and Family: More than three times a week   Attends Religious Services: Never   Marine scientist or Organizations: No   Attends Music therapist: Never   Marital Status: Married    Tobacco Counseling Counseling given: Not Answered   Clinical Intake:  Pre-visit preparation completed: Yes  Pain : No/denies pain     Nutritional Status: BMI of 19-24  Normal Nutritional Risks: None Diabetes: No  How often do you need to have someone help you when you read instructions, pamphlets, or other written materials from your doctor or pharmacy?: 1 - Never  Diabetic?No  Interpreter Needed?: No  Information entered by :: Caroleen Hamman LPN   Activities of Daily Living In your present state of health, do you have any difficulty performing the following activities: 04/04/2021 04/04/2021  Hearing? N N  Vision? N N  Difficulty concentrating or making decisions? N N  Walking or climbing stairs? N N  Dressing or bathing? N N  Doing errands, shopping? N N  Preparing Food and eating ? N -  Using the Toilet? N -  In the past six months, have you accidently leaked urine? N -  Do you have problems with loss of bowel control? N -  Managing your Medications? N -  Managing your Finances? N -  Housekeeping or managing your Housekeeping? N -  Some recent data might be hidden    Patient Care Team: Colon Branch, MD as PCP - General (Internal Medicine) Sherren Mocha, MD as PCP -  Cardiology (Cardiology) Tommy Medal, MD as Consulting Physician (Internal Medicine) Wonda Horner, MD as Consulting Physician (Gastroenterology) Truett Mainland, DO as Consulting Physician (Gynecology) Moses Manners, OD as Consulting Physician (Optometry)  Indicate any recent Medical Services you may have received from other than [facility] providers in the past year (date may be approximate).     Assessment:   This is a routine wellness examination for [facility].  Hearing/Vision screen Hearing Screening - Comments:: C/o mild hearing loss Vision Screening - Comments:: [facility]  Dietary issues and exercise activities discussed: Current Exercise Habits: Home exercise routine, Type of exercise: yoga;Other - see comments (water aerobics), Time (Minutes): 45, Frequency (Times/Week): 5, Weekly Exercise (Minutes/Week): 225, Intensity: Mild, Exercise limited by: None identified   Goals Addressed             This Visit's Progress    Maintain current healthy lifestyle.   On track      Depression Screen PHQ 2/9 Scores 04/04/2021 04/04/2021 03/07/2020 03/08/2019 02/17/2018 01/28/2017 04/02/2016  PHQ - 2 Score 0 0 0 0 0 0 0    Fall Risk Fall Risk  04/04/2021 04/04/2021 03/07/2020 03/08/2019 02/17/2018  Falls in the past year? 0 0 0 0 No  Number falls in past yr: 0 0 0 0 -  Injury with Fall? 0 0 0 - -  Follow up Falls prevention discussed Falls evaluation completed Education provided;Falls prevention discussed - -    FALL RISK PREVENTION PERTAINING TO THE HOME:  Any stairs in or around the home? Yes  If so, are there any without handrails? No  Home free of loose throw rugs in walkways, pet beds, electrical cords, etc? Yes  Adequate lighting in your home to reduce risk of falls? Yes   ASSISTIVE DEVICES UTILIZED TO PREVENT FALLS:  Life alert? No  Use of a cane, walker or w/c? No  Grab bars in the bathroom? Yes  Shower chair or bench in shower? No  Elevated toilet  seat or a handicapped toilet? No   TIMED UP AND GO:  Was the test performed? Yes .  Length of time to ambulate 10 feet: 10 sec.   Gait steady and fast without use of assistive device  Cognitive Function:Normal cognitive status assessed by direct observation by this Nurse Health Advisor. No abnormalities found.   MMSE - Mini Mental State Exam 01/28/2017  Orientation to time 5  Orientation to Place 5  Registration 3  Attention/ Calculation 5  Recall 3  Language- name 2 objects 2  Language- repeat 1  Language- follow 3 step command 3  Language- read & follow direction 1  Write a sentence 1  Copy design 1  Total score 30        Immunizations Immunization History  Administered Date(s) Administered   Influenza Split 05/15/2018   Influenza, High Dose Seasonal PF 05/27/2019   Influenza-Unspecified 05/26/2015, 05/26/2016, 06/04/2017, 05/25/2020   Moderna Sars-Covid-2 Vaccination 09/26/2019, 10/25/2019, 06/27/2020, 12/12/2020   Pneumococcal Conjugate-13 04/02/2016   Pneumococcal Polysaccharide-23 08/17/2014   Td 03/05/2017   Tdap 04/21/2007   Zoster Recombinat (Shingrix) 03/18/2019, 07/17/2019   Zoster, Live 08/26/2009    TDAP status: Up to date  Flu Vaccine status: Up to date  Pneumococcal vaccine status: Up to date  Covid-19 vaccine status: Completed vaccines  Qualifies for Shingles Vaccine? No   Zostavax completed Yes   Shingrix Completed?: Yes  Screening Tests Health Maintenance  Topic Date Due   INFLUENZA VACCINE  03/26/2021   MAMMOGRAM  04/28/2021   COLONOSCOPY (Pts 45-27yr Insurance coverage will need to be confirmed)  10/08/2023   TETANUS/TDAP  03/06/2027   DEXA SCAN  Completed   COVID-19 Vaccine  Completed   Hepatitis C Screening  Completed   PNA vac Low Risk Adult  Completed   Zoster Vaccines- Shingrix  Completed   HPV VACCINES  Aged Out    Health Maintenance  Health Maintenance Due  Topic Date Due   INFLUENZA VACCINE  03/26/2021     Colorectal cancer  screening: Type of screening: Colonoscopy. Completed 10/07/2013. Repeat every 10 years  Mammogram status: Completed Bilateral 04/28/2020. Repeat every year  Bone Density status: Completed 06/29/2020. Results reflect: Bone density results: NORMAL. Repeat every 2 years.  Lung Cancer Screening: (Low Dose CT Chest recommended if Age 50-80 years, 30 pack-year currently smoking OR have quit w/in 15years.) does not qualify.     Additional Screening:  Hepatitis C Screening:  Completed 10/14/2016  Vision Screening: Recommended annual ophthalmology exams for early detection of glaucoma and other disorders of the eye. Is the patient up to date with their annual eye exam?  Yes  Who is the provider or what is the name of the office in which the patient attends annual eye exams? [facility]   Dental Screening: Recommended annual dental exams for proper oral hygiene  Community Resource Referral / Chronic Care Management: CRR required this visit?  No   CCM required this visit?  No      Plan:     I have personally reviewed and noted the following in the patient's chart:   Medical and social history Use of alcohol, tobacco or illicit drugs  Current medications and supplements including opioid prescriptions.  Functional ability and status Nutritional status Physical activity Advanced directives List of other physicians Hospitalizations, surgeries, and ER visits in previous 12 months Vitals Screenings to include cognitive, depression, and falls Referrals and appointments  In addition, I have reviewed and discussed with patient certain preventive protocols, quality metrics, and best practice recommendations. A written personalized care plan for preventive services as well as general preventive health recommendations were provided to patient.   Patient to access avs on mychart  Marta Antu, [place]   [id number]  Nurse Health Advisor  Nurse Notes: None   I have  reviewed and agree with Health Coaches documentation.  Kathlene November, MD

## 2021-04-05 ENCOUNTER — Encounter: Payer: Self-pay | Admitting: Internal Medicine

## 2021-04-05 NOTE — Assessment & Plan Note (Signed)
-  Td 2018 - pnm-23 2015;  prevnar 03-2016 - zostavax 2011;  s/p shingrex - s/p covid vaccines x4 -rec flu shot q year -Female care: saw Dr Nehemiah Settle 10-2015, was rec RTC PRN MMG 04/2020 ([facility]), plans to proceed yearly  --CCS: normal cscope 2015.  His younger brother was just diagnosed with colon cancer, plan: Refer to GI, repeat colonoscopy? -DEXA 01-2017 normal.  On calcium and vitamin D supplements.  DEXA planned for 2023 -Labs: Will RTC fasting: CMP, FLP, CBC, A1c, TSH -diet  and exercise: Doing great.  + Weight loss -POA: Discussed

## 2021-04-05 NOTE — Assessment & Plan Note (Signed)
Here for CPX Hyperglycemia: Doing great with lifestyle, + weight loss, checking labs HTN: BP is very good, on losartan, checking labs High cholesterol: On Crestor, checking labs CAD: Asymptomatic.  Has not seen cardiology recently. Hip pain: Was seen by Ortho and physical therapy, doing better. Hand shakiness: Reports occasionally R>L hand shakiness, exam today is negative.  Reassess in 1 year RTC labs in few days RTC CPX 1 year

## 2021-04-06 ENCOUNTER — Other Ambulatory Visit (INDEPENDENT_AMBULATORY_CARE_PROVIDER_SITE_OTHER): Payer: Medicare HMO

## 2021-04-06 ENCOUNTER — Other Ambulatory Visit: Payer: Self-pay

## 2021-04-06 DIAGNOSIS — Z0000 Encounter for general adult medical examination without abnormal findings: Secondary | ICD-10-CM

## 2021-04-06 DIAGNOSIS — E785 Hyperlipidemia, unspecified: Secondary | ICD-10-CM

## 2021-04-06 DIAGNOSIS — R739 Hyperglycemia, unspecified: Secondary | ICD-10-CM

## 2021-04-06 LAB — COMPREHENSIVE METABOLIC PANEL
ALT: 52 U/L — ABNORMAL HIGH (ref 0–35)
AST: 58 U/L — ABNORMAL HIGH (ref 0–37)
Albumin: 4.4 g/dL (ref 3.5–5.2)
Alkaline Phosphatase: 76 U/L (ref 39–117)
BUN: 18 mg/dL (ref 6–23)
CO2: 29 mEq/L (ref 19–32)
Calcium: 9.7 mg/dL (ref 8.4–10.5)
Chloride: 103 mEq/L (ref 96–112)
Creatinine, Ser: 1.04 mg/dL (ref 0.40–1.20)
GFR: 53.15 mL/min — ABNORMAL LOW (ref >60.00)
Glucose, Bld: 87 mg/dL (ref 70–99)
Potassium: 5.1 mEq/L (ref 3.5–5.1)
Sodium: 138 mEq/L (ref 135–145)
Total Bilirubin: 0.8 mg/dL (ref 0.2–1.2)
Total Protein: 6.5 g/dL (ref 6.0–8.3)

## 2021-04-06 LAB — LIPID PANEL
Cholesterol: 178 mg/dL (ref 0–200)
HDL: 68.4 mg/dL (ref >39.00)
LDL Cholesterol: 95 mg/dL (ref 0–99)
NonHDL: 109.68
Total CHOL/HDL Ratio: 3
Triglycerides: 74 mg/dL (ref 0.0–149.0)
VLDL: 14.8 mg/dL (ref 0.0–40.0)

## 2021-04-06 LAB — CBC WITH DIFFERENTIAL/PLATELET
Basophils Absolute: 0 10*3/uL (ref 0.0–0.1)
Basophils Relative: 0.5 % (ref 0.0–3.0)
Eosinophils Absolute: 0.2 10*3/uL (ref 0.0–0.7)
Eosinophils Relative: 4.9 % (ref 0.0–5.0)
HCT: 40.2 % (ref 36.0–46.0)
Hemoglobin: 13.5 g/dL (ref 12.0–15.0)
Lymphocytes Relative: 39 % (ref 12.0–46.0)
Lymphs Abs: 1.8 10*3/uL (ref 0.7–4.0)
MCHC: 33.6 g/dL (ref 30.0–36.0)
MCV: 92.2 fl (ref 78.0–100.0)
Monocytes Absolute: 0.5 10*3/uL (ref 0.1–1.0)
Monocytes Relative: 11.4 % (ref 3.0–12.0)
Neutro Abs: 2 10*3/uL (ref 1.4–7.7)
Neutrophils Relative %: 44.2 % (ref 43.0–77.0)
Platelets: 146 10*3/uL — ABNORMAL LOW (ref 150.0–400.0)
RBC: 4.36 Mil/uL (ref 3.87–5.11)
RDW: 13.6 % (ref 11.5–15.5)
WBC: 4.6 10*3/uL (ref 4.0–10.5)

## 2021-04-06 LAB — TSH: TSH: 1.98 u[IU]/mL (ref 0.35–5.50)

## 2021-04-06 LAB — HEMOGLOBIN A1C: Hgb A1c MFr Bld: 6 % (ref 4.6–6.5)

## 2021-04-09 ENCOUNTER — Other Ambulatory Visit: Payer: Self-pay | Admitting: Internal Medicine

## 2021-04-09 MED ORDER — ROSUVASTATIN CALCIUM 40 MG PO TABS: 40.0000 mg | ORAL_TABLET | Freq: Every day | ORAL | 1 refills | Status: DC

## 2021-04-09 NOTE — Addendum Note (Signed)
Addended byDamita Dunnings D on: 04/09/2021 07:52 AM   Modules accepted: Orders

## 2021-05-04 ENCOUNTER — Telehealth: Payer: Self-pay | Admitting: Internal Medicine

## 2021-05-04 ENCOUNTER — Other Ambulatory Visit (HOSPITAL_BASED_OUTPATIENT_CLINIC_OR_DEPARTMENT_OTHER): Payer: Self-pay | Admitting: Internal Medicine

## 2021-05-04 DIAGNOSIS — E785 Hyperlipidemia, unspecified: Secondary | ICD-10-CM

## 2021-05-04 DIAGNOSIS — Z1231 Encounter for screening mammogram for malignant neoplasm of breast: Secondary | ICD-10-CM

## 2021-05-04 NOTE — Telephone Encounter (Signed)
Last LDL was 95, we increased Crestor to 40 mg. Please asked patient if she did that change. Proceed with FLP, AST, ALT

## 2021-05-04 NOTE — Telephone Encounter (Signed)
Orders placed, will call Pt shortly.

## 2021-05-04 NOTE — Telephone Encounter (Signed)
Patient stated she needed to make a lab appointment due to a change of cholesterol medicine per her last visit with Dr. Larose Kells. There were no future lab orders, Please advice.

## 2021-05-04 NOTE — Telephone Encounter (Signed)
Please advise- per labs last month I didn't see that you wanted to recheck labs only but for a visit in 6 months correct?

## 2021-05-04 NOTE — Telephone Encounter (Signed)
LMOM informing Pt that orders have been placed, asked that she call office to schedule lab appt and to let scheduler know if she stayed on previous dose of statin or started Crestor '40mg'$ .

## 2021-05-07 ENCOUNTER — Other Ambulatory Visit: Payer: Self-pay

## 2021-05-07 ENCOUNTER — Encounter (HOSPITAL_BASED_OUTPATIENT_CLINIC_OR_DEPARTMENT_OTHER): Payer: Self-pay

## 2021-05-07 ENCOUNTER — Ambulatory Visit (HOSPITAL_BASED_OUTPATIENT_CLINIC_OR_DEPARTMENT_OTHER)
Admission: RE | Admit: 2021-05-07 | Discharge: 2021-05-07 | Disposition: A | Discharge status: Home / Self Care | Payer: Medicare HMO | Source: Ambulatory Visit | Attending: Internal Medicine | Admitting: Internal Medicine

## 2021-05-07 DIAGNOSIS — Z1231 Encounter for screening mammogram for malignant neoplasm of breast: Secondary | ICD-10-CM | POA: Diagnosis not present

## 2021-05-24 ENCOUNTER — Other Ambulatory Visit: Payer: Self-pay | Admitting: Internal Medicine

## 2021-06-13 ENCOUNTER — Encounter: Payer: Self-pay | Admitting: Internal Medicine

## 2021-06-13 ENCOUNTER — Other Ambulatory Visit: Payer: Self-pay

## 2021-06-13 ENCOUNTER — Ambulatory Visit (INDEPENDENT_AMBULATORY_CARE_PROVIDER_SITE_OTHER): Payer: Medicare HMO | Admitting: Internal Medicine

## 2021-06-13 VITALS — BP 122/80 | HR 65 | Temp 98.1°F | Resp 16 | Ht 63.0 in | Wt 144.1 lb

## 2021-06-13 DIAGNOSIS — L600 Ingrowing nail: Secondary | ICD-10-CM | POA: Diagnosis not present

## 2021-06-13 DIAGNOSIS — R7989 Other specified abnormal findings of blood chemistry: Secondary | ICD-10-CM | POA: Diagnosis not present

## 2021-06-13 DIAGNOSIS — E785 Hyperlipidemia, unspecified: Secondary | ICD-10-CM

## 2021-06-13 LAB — HEPATIC FUNCTION PANEL
ALT: 32 U/L (ref 0–35)
AST: 42 U/L — ABNORMAL HIGH (ref 0–37)
Albumin: 4.7 g/dL (ref 3.5–5.2)
Alkaline Phosphatase: 65 U/L (ref 39–117)
Bilirubin, Direct: 0.3 mg/dL (ref 0.0–0.3)
Total Bilirubin: 1.9 mg/dL — ABNORMAL HIGH (ref 0.2–1.2)
Total Protein: 6.8 g/dL (ref 6.0–8.3)

## 2021-06-13 LAB — LIPID PANEL
Cholesterol: 167 mg/dL (ref 0–200)
HDL: 74.7 mg/dL (ref >39.00)
LDL Cholesterol: 73 mg/dL (ref 0–99)
NonHDL: 92.38
Total CHOL/HDL Ratio: 2
Triglycerides: 95 mg/dL (ref 0.0–149.0)
VLDL: 19 mg/dL (ref 0.0–40.0)

## 2021-06-13 NOTE — Assessment & Plan Note (Addendum)
Ingrown toenail: Very mild but bothersome to the patient, refer to podiatry. High cholesterol: History of CAD, last LDL 95, Crestor dose increased to 40 mg, compliance is good although she forgets to take it some nights.  She is working on it.  Check LFTs and FLP. Mild increased LFTs: Per chart review, no Tylenol, wine 1 or 2 glasses of wine at night.  Plan: Hep B and C, consider further labs in the future.  She is not obese. Preventive care: Had a flu shot, recommend COVID booster.

## 2021-06-13 NOTE — Patient Instructions (Signed)
Proceed with your COVID booster at your earliest convenience  We are referring you to podiatry.  Please go to the lab for a blood sample

## 2021-06-13 NOTE — Progress Notes (Addendum)
Subjective:    Patient ID: Kara Wheeler, female    DOB: Mar 15, 1946, 75 y.o.   MRN: [id number]  DOS:  06/13/2021 Type of visit - description: Acute  Chief complaint today is ingrown toenail, this is going on for months.  It causes pain but she denies any swelling, redness or discharge. She is due for FLP . Also noted a very mild increase of LFTs on chart review. Denies excessive Tylenol intake, take 1 glass of wine at night, rarely two  Review of Systems See above   Past Medical History:  Diagnosis Date   Ankle fracture, right 2013   Coronary atherosclerosis of native coronary artery 2005   Taxus drug-eluting stent to treat severe stenosis in the proximal LAD   HTN (hypertension)    Mixed dyslipidemia    Osteoarthritis     Past Surgical History:  Procedure Laterality Date   CERVICAL CONE BIOPSY  1978   benign   CORONARY STENT PLACEMENT  11/2003   LAD   TUBAL LIGATION Bilateral 1980    Allergies as of 06/13/2021   No Known Allergies      Medication List        Accurate as of June 13, 2021 11:03 AM. If you have any questions, ask your nurse or doctor.          aspirin 81 MG tablet Take 81 mg by mouth daily.   losartan 50 MG tablet Commonly known as: COZAAR TAKE 1 TABLET BY MOUTH EVERY DAY   Multi Vitamin Tabs Take 1 tablet by mouth daily.   rosuvastatin 40 MG tablet Commonly known as: CRESTOR Take 1 tablet (40 mg total) by mouth at bedtime.   VITAMIN D-3 PO Take 500 Units by mouth daily.           Objective:   Physical Exam BP 122/80 (BP Location: Left Arm, Patient Position: Sitting, Cuff Size: Small)   Pulse 65   Temp 98.1 F (36.7 C) (Oral)   Resp 16   Ht 5\' 3"  (1.6 m)   Wt 144 lb 2 oz (65.4 kg)   SpO2 97%   BMI 25.53 kg/m  General:   Well developed, NAD, BMI noted. HEENT:  Normocephalic . Face symmetric, atraumatic Lower extremities: L great toe: Has a very small ingrown toenail without swelling or redness Skin: Not  pale. Not jaundice Neurologic:  alert & oriented X3.  Speech normal, gait appropriate for age and unassisted Psych--  Cognition and judgment appear intact.  Cooperative with normal attention span and concentration.  Behavior appropriate. No anxious or depressed appearing.      Assessment      Assessment New pt 09-2015 Hyper glycemia A1c 6.0 (02-2017) HTN Dyslipidemia Elevated LFTs per chart review: AST slightly high, normal ALT CV: Dr Burt Knack  CAD dx 2005, was asx >> stent  H/o false  Positive stress-EKG Myoview 2013 negative.  stress echo normal 11-2014 Normal echo stress test 08-2017 H/o abnormal PAP, [facility] bx (remotely) H/o Fx distal fibula -R- 2013, no surgery  PLAN Ingrown toenail: Very mild but bothersome to the patient, refer to podiatry. High cholesterol: History of CAD, last LDL 95, Crestor dose increased to 40 mg, compliance is good although she forgets to take it some nights.  She is working on it.  Check LFTs and FLP. Mild increased LFTs: Per chart review, no Tylenol, wine 1 or 2 glasses of wine at night.  Plan: Hep B and C, consider further labs in the future.  She is not obese. Preventive care: Had a flu shot, recommend COVID booster.   This visit occurred during the SARS-CoV-2 public health emergency.  Safety protocols were in place, including screening questions prior to the visit, additional usage of staff PPE, and extensive cleaning of exam room while observing appropriate contact time as indicated for disinfecting solutions.

## 2021-06-14 LAB — HEPATITIS C ANTIBODY
Hepatitis C Ab: NONREACTIVE
SIGNAL TO CUT-OFF: 0.06 (ref <1.00)

## 2021-06-14 LAB — HEPATITIS B SURFACE ANTIGEN: Hepatitis B Surface Ag: NONREACTIVE

## 2021-06-14 LAB — HEPATITIS B CORE ANTIBODY, TOTAL: Hep B Core Total Ab: NONREACTIVE

## 2021-06-21 ENCOUNTER — Ambulatory Visit (INDEPENDENT_AMBULATORY_CARE_PROVIDER_SITE_OTHER): Payer: Medicare HMO | Admitting: Podiatry

## 2021-06-21 ENCOUNTER — Other Ambulatory Visit: Payer: Self-pay

## 2021-06-21 VITALS — BP 112/62 | HR 81 | Temp 98.1°F

## 2021-06-21 DIAGNOSIS — L600 Ingrowing nail: Secondary | ICD-10-CM | POA: Diagnosis not present

## 2021-06-21 DIAGNOSIS — M79675 Pain in left toe(s): Secondary | ICD-10-CM | POA: Diagnosis not present

## 2021-06-21 NOTE — Patient Instructions (Signed)

## 2021-06-24 NOTE — Progress Notes (Signed)
Subjective:   Patient ID: Rema Jasmine, female   DOB: 75 y.o.   MRN: [id number]   HPI 75 year old female presents the office today for concerns of ingrown toenail left great toe, medial aspect which is been ongoing for last 3 months.  She states that he gets red on the nail border as well as tenderness to touch.  No purulence.  No other treatment.  She has no other concerns today.   Review of Systems  All other systems reviewed and are negative. Past Medical History:  Diagnosis Date   Ankle fracture, right 2013   Coronary atherosclerosis of native coronary artery 2005   Taxus drug-eluting stent to treat severe stenosis in the proximal LAD   HTN (hypertension)    Mixed dyslipidemia    Osteoarthritis     Past Surgical History:  Procedure Laterality Date   CERVICAL CONE BIOPSY  1978   benign   CORONARY STENT PLACEMENT  11/2003   LAD   TUBAL LIGATION Bilateral 1980     Current Outpatient Medications:    aspirin 81 MG tablet, Take 81 mg by mouth daily.  , Disp: , Rfl:    Cholecalciferol (VITAMIN D-3 PO), Take 500 Units by mouth daily. , Disp: , Rfl:    losartan (COZAAR) 50 MG tablet, TAKE 1 TABLET BY MOUTH EVERY DAY, Disp: 90 tablet, Rfl: 3   Multiple Vitamin (MULTI VITAMIN) TABS, Take 1 tablet by mouth daily., Disp: , Rfl:    PAXLOVID, 300/100, 20 x 150 MG & 10 x 100MG  TBPK, , Disp: , Rfl:    rosuvastatin (CRESTOR) 40 MG tablet, Take 1 tablet (40 mg total) by mouth at bedtime., Disp: 90 tablet, Rfl: 1  No Known Allergies       Objective:  Physical Exam  General: AAO x3, NAD  Dermatological: Incurvation present to medial aspect left hallux toenail localized edema and erythema but there is no drainage or pus or ascending cellulitis.  I do believe the erythema is likely more from inflammation as opposed to infection.  No open lesions.  Vascular: Dorsalis Pedis artery and Posterior Tibial artery pedal pulses are 2/4 bilateral with immedate capillary fill time. There is  no pain with calf compression, swelling, warmth, erythema.   Neruologic: Grossly intact via light touch bilateral.   Musculoskeletal: Tenderness along medial aspect left hallux toenail without any other areas of discomfort.  Muscular strength 5/5 in all groups tested bilateral.  Gait: Unassisted, Nonantalgic.       Assessment:   Left medial hallux symptomatic ingrown toenail     Plan:  -Treatment options discussed including all alternatives, risks, and complications -Etiology of symptoms were discussed -At this time, the patient is requesting partial nail removal with chemical matricectomy to the symptomatic portion of the nail. Risks and complications were discussed with the patient for which they understand and written consent was obtained. Under sterile conditions a total of 3 mL of a mixture of 2% lidocaine plain and 0.5% Marcaine plain was infiltrated in a hallux block fashion. Once anesthetized, the skin was prepped in sterile fashion. A tourniquet was then applied. Next the medial aspect of hallux nail border was then sharply excised making sure to remove the entire offending nail border. Once the nails were ensured to be removed area was debrided and the underlying skin was intact. There is no purulence identified in the procedure. Next phenol was then applied under standard conditions and copiously irrigated. Silvadene was applied. A dry sterile dressing was applied.  After application of the dressing the tourniquet was removed and there is found to be an immediate capillary refill time to the digit. The patient tolerated the procedure well any complications. Post procedure instructions were discussed the patient for which he verbally understood. Follow-up in one week for nail check or sooner if any problems are to arise. Discussed signs/symptoms of infection and directed to call the office immediately should any occur or go directly to the emergency room. In the meantime, encouraged to  call the office with any questions, concerns, changes symptoms.  Trula Slade DPM

## 2021-07-10 ENCOUNTER — Ambulatory Visit (INDEPENDENT_AMBULATORY_CARE_PROVIDER_SITE_OTHER): Payer: PRIVATE HEALTH INSURANCE | Admitting: Podiatry

## 2021-07-10 ENCOUNTER — Other Ambulatory Visit: Payer: Self-pay

## 2021-07-10 DIAGNOSIS — L600 Ingrowing nail: Secondary | ICD-10-CM

## 2021-07-10 DIAGNOSIS — M79675 Pain in left toe(s): Secondary | ICD-10-CM

## 2021-07-10 NOTE — Patient Instructions (Signed)
  Continue soaking in epsom salts twice a day followed by antibiotic ointment and a band-aid. Can leave uncovered at night. Continue this until completely healed.  If the area has not healed in 2 weeks, call the office for follow-up appointment, or sooner if any problems arise.  Monitor for any signs/symptoms of infection. Call the office immediately if any occur or go directly to the emergency room. Call with any questions/concerns.     Soak Instructions    THE DAY AFTER THE PROCEDURE  Place 1/4 cup of epsom salts in a quart of warm tap water.  Submerge your foot or feet with outer bandage intact for the initial soak; this will allow the bandage to become moist and wet for easy lift off.  Once you remove your bandage, continue to soak in the solution for 20 minutes.  This soak should be done twice a day.  Next, remove your foot or feet from solution, blot dry the affected area and cover.  You may use a band aid large enough to cover the area or use gauze and tape.  Apply other medications to the area as directed by the doctor such as polysporin neosporin.  IF YOUR SKIN BECOMES IRRITATED WHILE USING THESE INSTRUCTIONS, IT IS OKAY TO SWITCH TO  WHITE VINEGAR AND WATER. Or you may use antibacterial soap and water to keep the toe clean  Monitor for any signs/symptoms of infection. Call the office immediately if any occur or go directly to the emergency room. Call with any questions/concerns.

## 2021-07-13 NOTE — Progress Notes (Signed)
Subjective: Kara Wheeler is a 75 y.o.  female returns to office today for follow up evaluation after having left hallux medial nail avulsion performed.  She has been soaking in Epson salts.  She has been still having some tenderness but doing a lot better.  Pain is 1/10.  Slight redness and swelling but no drainage or pus.  She still covered antibiotic ointment and a bandage.  Patient denies fevers, chills, nausea, vomiting. Denies any calf pain, chest pain, SOB.   Objective:   General: Well developed, nourished, in no acute distress, alert and oriented x3   Dermatology: Skin is warm, dry and supple bilateral.  Left hallux nail border appears to be clean, dry, with mild granular tissue and surrounding scab.  There is slight erythema proximal nail fold without any ascending cellulitis.  It is more from inflammation from the heel as opposed to infection.  There is no warmth.  There is no drainage or pus.  She has not seen any worsening of the redness.  Neurovascular status: Intact. No lower extremity swelling; No pain with calf compression bilateral.  Musculoskeletal: Decreased tenderness to palpation of the left hallux nail fold. Muscular strength within normal limits bilateral.   Assesement and Plan: S/p partial nail avulsion, improving  -Continue soaking in epsom salts twice a day followed by antibiotic ointment and a band-aid. Can leave uncovered at night. Continue this until completely healed.  -Onset erythema there is any worsening let me know.  -If the area has not healed in 2 weeks, call the office for follow-up appointment, or sooner if any problems arise.  -Monitor for any signs/symptoms of infection. Call the office immediately if any occur or go directly to the emergency room. Call with any questions/concerns.  Celesta Gentile, DPM

## 2021-10-03 ENCOUNTER — Other Ambulatory Visit: Payer: Self-pay | Admitting: Internal Medicine

## 2021-10-04 ENCOUNTER — Encounter: Payer: Self-pay | Admitting: Internal Medicine

## 2021-10-04 DIAGNOSIS — Z800 Family history of malignant neoplasm of digestive organs: Secondary | ICD-10-CM | POA: Diagnosis not present

## 2021-10-04 DIAGNOSIS — D123 Benign neoplasm of transverse colon: Secondary | ICD-10-CM | POA: Diagnosis not present

## 2021-10-04 DIAGNOSIS — Z1211 Encounter for screening for malignant neoplasm of colon: Secondary | ICD-10-CM | POA: Diagnosis not present

## 2021-10-04 LAB — HM COLONOSCOPY

## 2021-10-10 DIAGNOSIS — D123 Benign neoplasm of transverse colon: Secondary | ICD-10-CM | POA: Diagnosis not present

## 2021-10-15 ENCOUNTER — Other Ambulatory Visit: Payer: Medicare HMO

## 2021-10-25 ENCOUNTER — Telehealth: Payer: Self-pay | Admitting: *Deleted

## 2021-10-25 NOTE — Telephone Encounter (Signed)
Pt has lab appointment on 10/31/21.  There are future orders for AST and ALT.  Pt just had hepatic function and hepatitis testing on 06/13/21. AST and ALT were placed on 05/04/21.  Does pt still need these done? ?

## 2021-10-25 NOTE — Telephone Encounter (Signed)
Please complete AST, ALT- she does have a hx of elevated LFTs- last checked in 05/2021.  ?

## 2021-10-26 NOTE — Telephone Encounter (Signed)
Thank you :)

## 2021-10-31 ENCOUNTER — Other Ambulatory Visit (INDEPENDENT_AMBULATORY_CARE_PROVIDER_SITE_OTHER): Payer: Medicare HMO

## 2021-10-31 DIAGNOSIS — E785 Hyperlipidemia, unspecified: Secondary | ICD-10-CM

## 2021-10-31 LAB — AST: AST: 52 U/L — ABNORMAL HIGH (ref 0–37)

## 2021-10-31 LAB — ALT: ALT: 40 U/L — ABNORMAL HIGH (ref 0–35)

## 2021-11-06 ENCOUNTER — Encounter: Payer: Self-pay | Admitting: Internal Medicine

## 2021-11-06 ENCOUNTER — Ambulatory Visit (INDEPENDENT_AMBULATORY_CARE_PROVIDER_SITE_OTHER): Payer: Medicare HMO | Admitting: Internal Medicine

## 2021-11-06 VITALS — BP 122/80 | HR 62 | Temp 98.0°F | Resp 16 | Ht 63.0 in | Wt 147.2 lb

## 2021-11-06 DIAGNOSIS — E785 Hyperlipidemia, unspecified: Secondary | ICD-10-CM | POA: Diagnosis not present

## 2021-11-06 DIAGNOSIS — R7989 Other specified abnormal findings of blood chemistry: Secondary | ICD-10-CM

## 2021-11-06 DIAGNOSIS — I10 Essential (primary) hypertension: Secondary | ICD-10-CM | POA: Diagnosis not present

## 2021-11-06 NOTE — Progress Notes (Signed)
? ?Subjective:  ? ? Patient ID: Kara Wheeler, female    DOB: [birth year], 76 y.o.   MRN: [id number] ? ?DOS:  11/06/2021 ?Type of visit - description: Routine follow-up ? ?Since the last office visit he is doing well. ?We reviewed together her previous labs. ?Had a colonoscopy few months ago, no report available. ? ?She denies chest pain or difficulty breathing ?No nausea or vomiting.  No blood in the stools. ?She admits to some stress related to her husband's health ? ?Review of Systems ?See above  ? ?Past Medical History:  ?Diagnosis Date  ? Ankle fracture, right 2013  ? Coronary atherosclerosis of native coronary artery 2005  ? Taxus drug-eluting stent to treat severe stenosis in the proximal LAD  ? HTN (hypertension)   ? Mixed dyslipidemia   ? Osteoarthritis   ? ? ?Past Surgical History:  ?Procedure Laterality Date  ? CERVICAL CONE BIOPSY  1978  ? benign  ? CORONARY STENT PLACEMENT  11/2003  ? LAD  ? TUBAL LIGATION Bilateral 1980  ? ? ?Current Outpatient Medications  ?Medication Instructions  ? aspirin 81 mg, Daily  ? Cholecalciferol (VITAMIN D-3 PO) 500 Units, Oral, Daily  ? losartan (COZAAR) 50 MG tablet TAKE 1 TABLET BY MOUTH EVERY DAY  ? Multiple Vitamin (MULTI VITAMIN) TABS 1 tablet, Oral, Daily  ? PAXLOVID, 300/100, 20 x 150 MG & 10 x '100MG'$  TBPK No dose, route, or frequency recorded.  ? rosuvastatin (CRESTOR) 40 MG tablet TAKE 1 TABLET BY MOUTH EVERYDAY AT BEDTIME  ? ? ?   ?Objective:  ? Physical Exam ?BP 122/80 (BP Location: Left Arm, Patient Position: Sitting, Cuff Size: Small)   Pulse 62   Temp 98 ?F (36.7 ?C) (Oral)   Resp 16   Ht '5\' 3"'$  (1.6 m)   Wt 147 lb 4 oz (66.8 kg)   SpO2 97%   BMI 26.08 kg/m?  ?General:   ?Well developed, NAD, BMI noted. ?HEENT:  ?Normocephalic . Face symmetric, atraumatic ?Lungs:  ?CTA B ?Normal respiratory effort, no intercostal retractions, no accessory muscle use. ?Heart: RRR,  no murmur.  ?Lower extremities: no pretibial edema bilaterally  ?Skin: Not pale. Not  jaundice ?Neurologic:  ?alert & oriented X3.  ?Speech normal, gait appropriate for age and unassisted ?Psych--  ?Cognition and judgment appear intact.  ?Cooperative with normal attention span and concentration.  ?Behavior appropriate. ?No anxious or depressed appearing.  ? ?   ?Assessment   ? ?  Assessment ?New pt 09-2015 ?Hyper glycemia A1c 6.0 (02-2017) ?HTN ?Dyslipidemia ?Elevated LFTs: Mild, hep B&C (-)  October 2020 ?CV: Dr Burt Knack  ?CAD dx 2005, was asx >> stent  ?H/o false  Positive stress-EKG ?Myoview 2013 negative.  stress echo normal 11-2014 ?Normal echo stress test 08-2017 ?H/o abnormal PAP, [facility] bx (remotely) ?H/o Fx distal fibula -R- 2013, no surgery ? ?PLAN ?Hyperglycemia: Last A1c satisfactory, has a healthy lifestyle ?HTN: BP okay, last BMP okay, continue losartan. ?Dyslipidemia: Last LDL 73, continue Crestor. ?Increased LFTs: Since the last visit, hep B and C came back negative, LFTs stable, minimal elevation.  Will monitor levels over time. ?CAD: Asymptomatic. ?Stress: Related to her husband's health, listening therapy provided. ?Preventive care: Had a colonoscopy few months ago, will get report. ?RTC-2023, CPX ? ?This visit occurred during the SARS-CoV-2 public health emergency.  Safety protocols were in place, including screening questions prior to the visit, additional usage of staff PPE, and extensive cleaning of exam room while observing appropriate contact time as indicated  for disinfecting solutions.  ? ?

## 2021-11-06 NOTE — Patient Instructions (Signed)
Check the  blood pressure regularly ?BP GOAL is between 110/65 and  135/85. ?If it is consistently higher or lower, let me know ? ?  ? ? ?GO TO THE FRONT DESK, PLEASE SCHEDULE YOUR APPOINTMENTS ?Come back for   a physical exam, fasting by 03-2022 ?

## 2021-11-07 NOTE — Assessment & Plan Note (Signed)
Hyperglycemia: Last A1c satisfactory, has a healthy lifestyle ?HTN: BP okay, last BMP okay, continue losartan. ?Dyslipidemia: Last LDL 73, continue Crestor. ?Increased LFTs: Since the last visit, hep B and C came back negative, LFTs stable, minimal elevation.  Will monitor levels over time. ?CAD: Asymptomatic. ?Stress: Related to her husband's health, listening therapy provided. ?Preventive care: Had a colonoscopy few months ago, will get report. ?RTC-2023, CPX ?

## 2021-11-14 ENCOUNTER — Encounter: Payer: Self-pay | Admitting: Internal Medicine

## 2021-11-28 DIAGNOSIS — U071 COVID-19: Secondary | ICD-10-CM | POA: Diagnosis not present

## 2021-11-28 DIAGNOSIS — I10 Essential (primary) hypertension: Secondary | ICD-10-CM | POA: Diagnosis not present

## 2022-01-17 DIAGNOSIS — H524 Presbyopia: Secondary | ICD-10-CM | POA: Diagnosis not present

## 2022-01-22 DIAGNOSIS — Z0100 Encounter for examination of eyes and vision without abnormal findings: Secondary | ICD-10-CM | POA: Diagnosis not present

## 2022-04-02 ENCOUNTER — Encounter: Payer: Self-pay | Admitting: Family Medicine

## 2022-04-02 ENCOUNTER — Ambulatory Visit: Payer: Medicare HMO | Admitting: Family Medicine

## 2022-04-02 VITALS — BP 123/81 | Ht 63.0 in | Wt 147.0 lb

## 2022-04-02 DIAGNOSIS — M23203 Derangement of unspecified medial meniscus due to old tear or injury, right knee: Secondary | ICD-10-CM

## 2022-04-02 NOTE — Progress Notes (Signed)
  Kara Wheeler - 76 y.o. female MRN [id number]  Date of birth: 02-Apr-1946  SUBJECTIVE:  Including CC & ROS.  No chief complaint on file.   PHYILLIS Wheeler is a 76 y.o. female that is presenting with acute right knee pain.  The pain has been ongoing for about a month.  She was on a trip in [place] when the pain occurred.  Feels unstable at times when she puts pressure on the knee joint.   Review of Systems See HPI   HISTORY: Past Medical, Surgical, Social, and Family History Reviewed & Updated per EMR.   Pertinent Historical Findings include:  Past Medical History:  Diagnosis Date   Ankle fracture, right 2013   Coronary atherosclerosis of native coronary artery 2005   Taxus drug-eluting stent to treat severe stenosis in the proximal LAD   HTN (hypertension)    Mixed dyslipidemia    Osteoarthritis     Past Surgical History:  Procedure Laterality Date   CERVICAL CONE BIOPSY  1978   benign   CORONARY STENT PLACEMENT  11/2003   LAD   TUBAL LIGATION Bilateral 1980     PHYSICAL EXAM:  VS: BP 123/81 (BP Location: Left Arm, Patient Position: Sitting)   Ht '5\' 3"'$  (1.6 m)   Wt 147 lb (66.7 kg)   BMI 26.04 kg/m  Physical Exam Gen: NAD, alert, cooperative with exam, well-appearing MSK:  Neurovascularly intact       ASSESSMENT & PLAN:   Degenerative tear of medial meniscus of right knee Acutely occurring.  Symptoms most consistent with meniscus irritation.  No effusion on exam today. -Counseled on home exercise therapy and supportive care. -Could consider physical therapy or further imaging.

## 2022-04-02 NOTE — Assessment & Plan Note (Signed)
Acutely occurring.  Symptoms most consistent with meniscus irritation.  No effusion on exam today. -Counseled on home exercise therapy and supportive care. -Could consider physical therapy or further imaging.

## 2022-04-02 NOTE — Patient Instructions (Signed)
Good to see you Please try wall sit ups up to 2 minutes per time and up to 4 times per day. Please try side-lying hip abduction with 10 times side-lying.  10 times toe to heel.  10 times forward paintbrush and backward pain less. You can do hip abduction while on all fours with 10 times performing the fire hydrant.  Please send me a message in MyChart with any questions or updates.  Please see me back in 4 weeks or as needed if better.   --Dr. Raeford Razor

## 2022-04-05 ENCOUNTER — Encounter: Payer: Medicare HMO | Admitting: Internal Medicine

## 2022-04-08 ENCOUNTER — Ambulatory Visit: Payer: Medicare HMO

## 2022-05-01 ENCOUNTER — Ambulatory Visit (INDEPENDENT_AMBULATORY_CARE_PROVIDER_SITE_OTHER): Payer: Medicare HMO | Admitting: *Deleted

## 2022-05-01 DIAGNOSIS — Z0000 Encounter for general adult medical examination without abnormal findings: Secondary | ICD-10-CM

## 2022-05-01 NOTE — Progress Notes (Signed)
Subjective:   Kara Wheeler is a 76 y.o. female who presents for Medicare Annual (Subsequent) preventive examination. I connected with  Rema Jasmine on 05/01/22 by a audio enabled telemedicine application and verified that I am speaking with the correct person using two identifiers.  Patient Location: Home  Provider Location: Office/Clinic  I discussed the limitations of evaluation and management by telemedicine. The patient expressed understanding and agreed to proceed.   Review of Systems    Defer to PCP Cardiac Risk Factors include: advanced age (>44mn, >>7women);hypertension;dyslipidemia     Objective:    There were no vitals filed for this visit. There is no height or weight on file to calculate BMI.     05/01/2022    2:59 PM 04/04/2021    2:19 PM 07/11/2020   10:20 AM 03/07/2020    8:52 AM 02/17/2018    1:15 PM 01/28/2017    8:58 AM 01/24/2016    2:15 PM  Advanced Directives  Does Patient Have a Medical Advance Directive? No No Yes Yes No No No  Type of AScientist, physiologicalof APetermanLiving will HRogersLiving will     Does patient want to make changes to medical advance directive?    No - Patient declined     Copy of HPharrin Chart?   No - copy requested No - copy requested     Would patient like information on creating a medical advance directive? No - Patient declined No - Patient declined   Yes (MAU/Ambulatory/Procedural Areas - Information given) Yes (MAU/Ambulatory/Procedural Areas - Information given) No - patient declined information    Current Medications (verified) Outpatient Encounter Medications as of 05/01/2022  Medication Sig   aspirin 81 MG tablet Take 81 mg by mouth daily.     Cholecalciferol (VITAMIN D-3 PO) Take 500 Units by mouth daily.    losartan (COZAAR) 50 MG tablet TAKE 1 TABLET BY MOUTH EVERY DAY   Multiple Vitamin (MULTI VITAMIN) TABS Take 1 tablet by mouth daily.    rosuvastatin (CRESTOR) 40 MG tablet TAKE 1 TABLET BY MOUTH EVERYDAY AT BEDTIME   No facility-administered encounter medications on file as of 05/01/2022.    Allergies (verified) Patient has no known allergies.   History: Past Medical History:  Diagnosis Date   Ankle fracture, right 2013   Coronary atherosclerosis of native coronary artery 2005   Taxus drug-eluting stent to treat severe stenosis in the proximal LAD   HTN (hypertension)    Mixed dyslipidemia    Osteoarthritis    Past Surgical History:  Procedure Laterality Date   CERVICAL CONE BIOPSY  1978   benign   CORONARY STENT PLACEMENT  11/2003   LAD   TUBAL LIGATION Bilateral 1980   Family History  Problem Relation Age of Onset   Lymphoma Mother        Lymphoma   Heart attack Father 461      second at age 75  Heart disease Brother    Colon cancer Brother 634  Atrial fibrillation Brother    Heart attack Paternal Grandmother    Stroke Paternal Grandmother    Heart attack Paternal Grandfather    Stroke Paternal Grandfather    Breast cancer Neg Hx    Diabetes Neg Hx    Social History   Socioeconomic History   Marital status: Married    Spouse name: Not on file   Number of children: 1  Years of education: Not on file   Highest education level: Not on file  Occupational History   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 [employer]  Tobacco Use   Smoking status: Former    Packs/day: 0.25    Years: 4.00    Total pack years: 1.00    Types: Cigarettes    Start date: 08/26/1962    Quit date: 08/26/1966    Years since quitting: 55.7   Smokeless tobacco: Never  Substance and Sexual Activity   Alcohol use: Yes    Comment: glass of  wine rarely   Drug use: No    Comment: marijuana -- very little yrs ago   Sexual activity: Yes    Partners: Male    Birth control/protection: Surgical, Post-menopausal    Comment: tubal  Other Topics Concern   Not on file  Social History Narrative   Household-- pt and husband  (Mr Creighton)   Social Determinants of Health   Financial Resource Strain: Low Risk  (04/04/2021)   Overall Financial Resource Strain (CARDIA)    Difficulty of Paying Living Expenses: Not hard at all  Food Insecurity: No Food Insecurity (04/04/2021)   Hunger Vital Sign    Worried About Running Out of Food in the Last Year: Never true    [place] in the Last Year: Never true  Transportation Needs: No Transportation Needs (04/04/2021)   PRAPARE - Hydrologist (Medical): No    Lack of Transportation (Non-Medical): No  Physical Activity: Sufficiently Active (04/04/2021)   Exercise Vital Sign    Days of Exercise per Week: 5 days    Minutes of Exercise per Session: 40 min  Stress: No Stress Concern Present (04/04/2021)   [place]    Feeling of Stress : Not at all  Social Connections: Moderately Isolated (04/04/2021)   Social Connection and Isolation Panel [NHANES]    Frequency of Communication with Friends and Family: More than three times a week    Frequency of Social Gatherings with Friends and Family: More than three times a week    Attends Religious Services: Never    Marine scientist or Organizations: No    Attends Music therapist: Never    Marital Status: Married    Tobacco Counseling Counseling given: Not Answered   Clinical Intake:  Pre-visit preparation completed: Yes  Pain : No/denies pain     Diabetes: No  How often do you need to have someone help you when you read instructions, pamphlets, or other written materials from your doctor or pharmacy?: 1 - Never  Diabetic? No  Interpreter Needed?: No  Activities of Daily Living    05/01/2022    3:03 PM  In your present state of health, do you have any difficulty performing the following activities:  Hearing? 1  Comment very slight hearing loss  Vision? 0  Difficulty concentrating or making  decisions? 0  Walking or climbing stairs? 0  Dressing or bathing? 0  Doing errands, shopping? 0  Preparing Food and eating ? N  Using the Toilet? N  In the past six months, have you accidently leaked urine? N  Do you have problems with loss of bowel control? N  Managing your Medications? N  Managing your Finances? N  Housekeeping or managing your Housekeeping? N    Patient Care Team: Colon Branch, MD as PCP - General (Internal Medicine) Sherren Mocha, MD  as PCP - Cardiology (Cardiology) Tommy Medal, MD as Consulting Physician (Internal Medicine) Truett Mainland, DO as Consulting Physician (Gynecology) Moses Manners, OD as Consulting Physician (Optometry) Ronald Lobo, MD as Consulting Physician (Gastroenterology)  Indicate any recent Medical Services you may have received from other than [facility] providers in the past year (date may be approximate).     Assessment:   This is a routine wellness examination for [facility].  Hearing/Vision screen No results found.  Dietary issues and exercise activities discussed: Current Exercise Habits: Structured exercise class, Type of exercise: Other - see comments (silver sneakers and yoga), Time (Minutes): 45, Frequency (Times/Week): 7, Weekly Exercise (Minutes/Week): 315, Intensity: Mild, Exercise limited by: None identified   Goals Addressed   None    Depression Screen    05/01/2022    3:02 PM 11/06/2021    9:54 AM 04/04/2021    2:27 PM 04/04/2021    1:40 PM 03/07/2020    8:58 AM 03/08/2019    9:44 AM 02/17/2018    1:15 PM  PHQ 2/9 Scores  PHQ - 2 Score 0 0 0 0 0 0 0    Fall Risk    05/01/2022    3:00 PM 11/06/2021    9:54 AM 04/04/2021    2:24 PM 04/04/2021    1:41 PM 03/07/2020    8:57 AM  Bee in the past year? 0 0 0 0 0  Number falls in past yr: 0 0 0 0 0  Injury with Fall? 0 0 0 0 0  Risk for fall due to : No Fall Risks      Follow up Falls evaluation completed Falls evaluation completed Falls prevention  discussed Falls evaluation completed Education provided;Falls prevention discussed    FALL RISK PREVENTION PERTAINING TO THE HOME:  Any stairs in or around the home? Yes  If so, are there any without handrails? No  Home free of loose throw rugs in walkways, pet beds, electrical cords, etc? Yes  Adequate lighting in your home to reduce risk of falls? Yes   ASSISTIVE DEVICES UTILIZED TO PREVENT FALLS:  Life alert? No  Use of a cane, walker or w/c? No  Grab bars in the bathroom? No  Shower chair or bench in shower? No  Elevated toilet seat or a handicapped toilet? No    Cognitive Function:    01/28/2017    9:00 AM  MMSE - Mini Mental State Exam  Orientation to time 5  Orientation to Place 5  Registration 3  Attention/ Calculation 5  Recall 3  Language- name 2 objects 2  Language- repeat 1  Language- follow 3 step command 3  Language- read & follow direction 1  Write a sentence 1  Copy design 1  Total score 30        05/01/2022    3:14 PM  6CIT Screen  What Year? 0 points  What month? 0 points  What time? 0 points  Count back from 20 0 points  Months in reverse 0 points  Repeat phrase 0 points  Total Score 0 points    Immunizations Immunization History  Administered Date(s) Administered   Influenza Split 05/15/2018   Influenza, High Dose Seasonal PF 05/27/2019, 06/06/2021   Influenza-Unspecified 05/26/2015, 05/26/2016, 06/04/2017, 05/25/2020   Moderna Covid-19 Vaccine Bivalent Booster 81yr & up 06/20/2021   Moderna Sars-Covid-2 Vaccination 09/26/2019, 10/25/2019, 06/27/2020, 12/12/2020   Pneumococcal Conjugate-13 04/02/2016   Pneumococcal Polysaccharide-23 08/17/2014   Td 03/05/2017   Tdap  04/21/2007   Zoster Recombinat (Shingrix) 03/18/2019, 07/17/2019   Zoster, Live 08/26/2009    TDAP status: Up to date  Flu Vaccine status: Due, Education has been provided regarding the importance of this vaccine. Advised may receive this vaccine at local pharmacy or  Health Dept. Aware to provide a copy of the vaccination record if obtained from local pharmacy or Health Dept. Verbalized acceptance and understanding.  Pneumococcal vaccine status: Up to date  Covid-19 vaccine status: Information provided on how to obtain vaccines.   Qualifies for Shingles Vaccine? Yes   Zostavax completed Yes   Shingrix Completed?: Yes  Screening Tests Health Maintenance  Topic Date Due   COVID-19 Vaccine (6 - Moderna series) 10/21/2021   INFLUENZA VACCINE  03/26/2022   MAMMOGRAM  05/07/2022   TETANUS/TDAP  03/06/2027   COLONOSCOPY (Pts 45-24yr Insurance coverage will need to be confirmed)  10/04/2028   Pneumonia Vaccine 75 Years old  Completed   DEXA SCAN  Completed   Hepatitis C Screening  Completed   Zoster Vaccines- Shingrix  Completed   HPV VACCINES  Aged Out    Health Maintenance  Health Maintenance Due  Topic Date Due   COVID-19 Vaccine (6 - Moderna series) 10/21/2021   INFLUENZA VACCINE  03/26/2022    Colorectal cancer screening: Type of screening: Colonoscopy. Completed 10/04/21. Repeat every 7 years  Mammogram status: Completed 05/07/21. Repeat every year  Bone Density status: Completed 06/29/20. Results reflect: Bone density results: NORMAL. Repeat every 2 years.  Lung Cancer Screening: (Low Dose CT Chest recommended if Age 158-80years, 30 pack-year currently smoking OR have quit w/in 15years.) does not qualify.   Lung Cancer Screening Referral: N/a  Additional Screening:  Hepatitis C Screening: does qualify; Completed 06/13/21  Vision Screening: Recommended annual ophthalmology exams for early detection of glaucoma and other disorders of the eye. Is the patient up to date with their annual eye exam?  Yes  Who is the provider or what is the name of the office in which the patient attends annual eye exams? Dr. DShanon BrowDaughtry If pt is not established with a provider, would they like to be referred to a provider to establish care? No .    Dental Screening: Recommended annual dental exams for proper oral hygiene  Community Resource Referral / Chronic Care Management: CRR required this visit?  No   CCM required this visit?  No      Plan:     I have personally reviewed and noted the following in the patient's chart:   Medical and social history Use of alcohol, tobacco or illicit drugs  Current medications and supplements including opioid prescriptions. Patient is not currently taking opioid prescriptions. Functional ability and status Nutritional status Physical activity Advanced directives List of other physicians Hospitalizations, surgeries, and ER visits in previous 12 months Vitals Screenings to include cognitive, depression, and falls Referrals and appointments  In addition, I have reviewed and discussed with patient certain preventive protocols, quality metrics, and best practice recommendations. A written personalized care plan for preventive services as well as general preventive health recommendations were provided to patient.   Due to this being a telephonic visit, the after visit summary with patients personalized plan was offered to patient via mail or my-chart. Patient would like to access on my-chart.   BBeatris Ship COregon  05/01/2022   Nurse Notes: None

## 2022-05-01 NOTE — Patient Instructions (Signed)
Kara Wheeler , Thank you for taking time to come for your Medicare Wellness Visit. I appreciate your ongoing commitment to your health goals. Please review the following plan we discussed and let me know if I can assist you in the future.   These are the goals we discussed:  Goals       Maintain current healthy lifestyle.      Weight (lb) < 140 lb (63.5 kg) (pt-stated)        This is a list of the screening recommended for you and due dates:  Health Maintenance  Topic Date Due   COVID-19 Vaccine (6 - Moderna series) 10/21/2021   Flu Shot  03/26/2022   Mammogram  05/07/2022   Tetanus Vaccine  03/06/2027   Colon Cancer Screening  10/04/2028   Pneumonia Vaccine  Completed   DEXA scan (bone density measurement)  Completed   Hepatitis C Screening: USPSTF Recommendation to screen - Ages 43-79 yo.  Completed   Zoster (Shingles) Vaccine  Completed   HPV Vaccine  Aged Out       Next appointment: Follow up in one year for your annual wellness visit    Preventive Care 65 Years and Older, Female Preventive care refers to lifestyle choices and visits with your health care provider that can promote health and wellness. What does preventive care include? A yearly physical exam. This is also called an annual well check. Dental exams once or twice a year. Routine eye exams. Ask your health care provider how often you should have your eyes checked. Personal lifestyle choices, including: Daily care of your teeth and gums. Regular physical activity. Eating a healthy diet. Avoiding tobacco and drug use. Limiting alcohol use. Practicing safe sex. Taking low-dose aspirin every day. Taking vitamin and mineral supplements as recommended by your health care provider. What happens during an annual well check? The services and screenings done by your health care provider during your annual well check will depend on your age, overall health, lifestyle risk factors, and family history of  disease. Counseling  Your health care provider may ask you questions about your: Alcohol use. Tobacco use. Drug use. Emotional well-being. Home and relationship well-being. Sexual activity. Eating habits. History of falls. Memory and ability to understand (cognition). Work and work Statistician. Reproductive health. Screening  You may have the following tests or measurements: Height, weight, and BMI. Blood pressure. Lipid and cholesterol levels. These may be checked every 5 years, or more frequently if you are over 51 years old. Skin check. Lung cancer screening. You may have this screening every year starting at age 34 if you have a 30-pack-year history of smoking and currently smoke or have quit within the past 15 years. Fecal occult blood test (FOBT) of the stool. You may have this test every year starting at age 53. Flexible sigmoidoscopy or colonoscopy. You may have a sigmoidoscopy every 5 years or a colonoscopy every 10 years starting at age 40. Hepatitis C blood test. Hepatitis B blood test. Sexually transmitted disease (STD) testing. Diabetes screening. This is done by checking your blood sugar (glucose) after you have not eaten for a while (fasting). You may have this done every 1-3 years. Bone density scan. This is done to screen for osteoporosis. You may have this done starting at age 36. Mammogram. This may be done every 1-2 years. Talk to your health care provider about how often you should have regular mammograms. Talk with your health care provider about your test results, treatment  options, and if necessary, the need for more tests. Vaccines  Your health care provider may recommend certain vaccines, such as: Influenza vaccine. This is recommended every year. Tetanus, diphtheria, and acellular pertussis (Tdap, Td) vaccine. You may need a Td booster every 10 years. Zoster vaccine. You may need this after age 52. Pneumococcal 13-valent conjugate (PCV13) vaccine. One  dose is recommended after age 2. Pneumococcal polysaccharide (PPSV23) vaccine. One dose is recommended after age 75. Talk to your health care provider about which screenings and vaccines you need and how often you need them. This information is not intended to replace advice given to you by your health care provider. Make sure you discuss any questions you have with your health care provider. Document Released: 09/08/2015 Document Revised: 05/01/2016 Document Reviewed: 06/13/2015 Elsevier Interactive Patient Education  2017 [place] Prevention in the Home Falls can cause injuries. They can happen to people of all ages. There are many things you can do to make your home safe and to help prevent falls. What can I do on the outside of my home? Regularly fix the edges of walkways and driveways and fix any cracks. Remove anything that might make you trip as you walk through a door, such as a raised step or threshold. Trim any bushes or trees on the path to your home. Use bright outdoor lighting. Clear any walking paths of anything that might make someone trip, such as rocks or tools. Regularly check to see if handrails are loose or broken. Make sure that both sides of any steps have handrails. Any raised decks and porches should have guardrails on the edges. Have any leaves, snow, or ice cleared regularly. Use sand or salt on walking paths during winter. Clean up any spills in your garage right away. This includes oil or grease spills. What can I do in the bathroom? Use night lights. Install grab bars by the toilet and in the tub and shower. Do not use towel bars as grab bars. Use non-skid mats or decals in the tub or shower. If you need to sit down in the shower, use a plastic, non-slip stool. Keep the floor dry. Clean up any water that spills on the floor as soon as it happens. Remove soap buildup in the tub or shower regularly. Attach bath mats securely with double-sided  non-slip rug tape. Do not have throw rugs and other things on the floor that can make you trip. What can I do in the bedroom? Use night lights. Make sure that you have a light by your bed that is easy to reach. Do not use any sheets or blankets that are too big for your bed. They should not hang down onto the floor. Have a firm chair that has side arms. You can use this for support while you get dressed. Do not have throw rugs and other things on the floor that can make you trip. What can I do in the kitchen? Clean up any spills right away. Avoid walking on wet floors. Keep items that you use a lot in easy-to-reach places. If you need to reach something above you, use a strong step stool that has a grab bar. Keep electrical cords out of the way. Do not use floor polish or wax that makes floors slippery. If you must use wax, use non-skid floor wax. Do not have throw rugs and other things on the floor that can make you trip. What can I do with my stairs? Do not  leave any items on the stairs. Make sure that there are handrails on both sides of the stairs and use them. Fix handrails that are broken or loose. Make sure that handrails are as long as the stairways. Check any carpeting to make sure that it is firmly attached to the stairs. Fix any carpet that is loose or worn. Avoid having throw rugs at the top or bottom of the stairs. If you do have throw rugs, attach them to the floor with carpet tape. Make sure that you have a light switch at the top of the stairs and the bottom of the stairs. If you do not have them, ask someone to add them for you. What else can I do to help prevent falls? Wear shoes that: Do not have high heels. Have rubber bottoms. Are comfortable and fit you well. Are closed at the toe. Do not wear sandals. If you use a stepladder: Make sure that it is fully opened. Do not climb a closed stepladder. Make sure that both sides of the stepladder are locked into place. Ask  someone to hold it for you, if possible. Clearly mark and make sure that you can see: Any grab bars or handrails. First and last steps. Where the edge of each step is. Use tools that help you move around (mobility aids) if they are needed. These include: Canes. Walkers. Scooters. Crutches. Turn on the lights when you go into a dark area. Replace any light bulbs as soon as they burn out. Set up your furniture so you have a clear path. Avoid moving your furniture around. If any of your floors are uneven, fix them. If there are any pets around you, be aware of where they are. Review your medicines with your doctor. Some medicines can make you feel dizzy. This can increase your chance of falling. Ask your doctor what other things that you can do to help prevent falls. This information is not intended to replace advice given to you by your health care provider. Make sure you discuss any questions you have with your health care provider. Document Released: 06/08/2009 Document Revised: 01/18/2016 Document Reviewed: 09/16/2014 Elsevier Interactive Patient Education  2017 [employer].

## 2022-05-14 ENCOUNTER — Ambulatory Visit (INDEPENDENT_AMBULATORY_CARE_PROVIDER_SITE_OTHER): Payer: Medicare HMO | Admitting: Internal Medicine

## 2022-05-14 ENCOUNTER — Encounter: Payer: Self-pay | Admitting: Internal Medicine

## 2022-05-14 VITALS — BP 116/62 | HR 74 | Temp 98.2°F | Resp 16 | Ht 63.0 in | Wt 144.5 lb

## 2022-05-14 DIAGNOSIS — I10 Essential (primary) hypertension: Secondary | ICD-10-CM

## 2022-05-14 DIAGNOSIS — L989 Disorder of the skin and subcutaneous tissue, unspecified: Secondary | ICD-10-CM

## 2022-05-14 DIAGNOSIS — Z23 Encounter for immunization: Secondary | ICD-10-CM

## 2022-05-14 DIAGNOSIS — R7989 Other specified abnormal findings of blood chemistry: Secondary | ICD-10-CM | POA: Diagnosis not present

## 2022-05-14 DIAGNOSIS — E785 Hyperlipidemia, unspecified: Secondary | ICD-10-CM | POA: Diagnosis not present

## 2022-05-14 DIAGNOSIS — R739 Hyperglycemia, unspecified: Secondary | ICD-10-CM | POA: Diagnosis not present

## 2022-05-14 DIAGNOSIS — Z0000 Encounter for general adult medical examination without abnormal findings: Secondary | ICD-10-CM

## 2022-05-14 NOTE — Progress Notes (Unsigned)
Subjective:    Patient ID: Kara Wheeler, female    DOB: [birth year], 76 y.o.   MRN: [id number]  DOS:  05/14/2022 Type of visit - description: CPX  Since the last office visit is doing well and has no major concerns other than some darker spots on her face.   Review of Systems See above   Past Medical History:  Diagnosis Date   Ankle fracture, right 2013   Coronary atherosclerosis of native coronary artery 2005   Taxus drug-eluting stent to treat severe stenosis in the proximal LAD   HTN (hypertension)    Mixed dyslipidemia    Osteoarthritis     Past Surgical History:  Procedure Laterality Date   CERVICAL CONE BIOPSY  1978   benign   CORONARY STENT PLACEMENT  11/2003   LAD   TUBAL LIGATION Bilateral 1980    Current Outpatient Medications  Medication Instructions   aspirin 81 mg, Daily   Cholecalciferol (VITAMIN D-3 PO) 500 Units, Oral, Daily   losartan (COZAAR) 50 MG tablet TAKE 1 TABLET BY MOUTH EVERY DAY   Multiple Vitamin (MULTI VITAMIN) TABS 1 tablet, Oral, Daily   rosuvastatin (CRESTOR) 40 MG tablet TAKE 1 TABLET BY MOUTH EVERYDAY AT BEDTIME       Objective:   Physical Exam BP 116/62   Pulse 74   Temp 98.2 F (36.8 C) (Oral)   Resp 16   Ht '5\' 3"'$  (1.6 m)   Wt 144 lb 8 oz (65.5 kg)   SpO2 97%   BMI 25.60 kg/m  General: Well developed, NAD, BMI noted Neck: No  thyromegaly  HEENT:  Normocephalic . Face symmetric, atraumatic Lungs:  CTA B Normal respiratory effort, no intercostal retractions, no accessory muscle use. Heart: RRR,  no murmur.  Abdomen:  Not distended, soft, non-tender. No rebound or rigidity.   Lower extremities: no pretibial edema bilaterally  Skin: Exposed areas without rash.  Has few hyperpigmented, papular, 2 to 3 mm in size, spots at the face mostly near the right eye Neurologic:  alert & oriented X3.  Speech normal, gait appropriate for age and unassisted Strength symmetric and appropriate for age.  Psych: Cognition and  judgment appear intact.  Cooperative with normal attention span and concentration.  Behavior appropriate. No anxious or depressed appearing.     Assessment    Assessment New pt 09-2015 Hyper glycemia A1c 6.0 (02-2017) HTN Dyslipidemia Elevated LFTs: Mild, chronic, hep B&C (-)    CV: Dr Burt Knack  CAD dx 2005, was asx >> stent  H/o false  Positive stress-EKG Myoview 2013 negative.  stress echo normal 11-2014 Normal echo stress test 08-2017 H/o abnormal PAP, cone bx (remotely) H/o Fx distal fibula -R- 2013, no surgery  PLAN Here for CPX Hyperglycemia: Check A1c, has a healthy lifestyle. HTN: BP is very good, continue losartan. High cholesterol: On Crestor, checking labs CAD: On aspirin, no symptoms.  Normal stress echo 2019.   Skin lesions: Refer to Derm  increased LFTs: Has 1 or 2 glasses of wine most days, no Tylenol.  Recheck LFTs, iron ferritin, ANA, anti-smooth muscle antibody, SPEP. RTC 6 months  -Td 2018 - pnm-23: 2015;  prevnar 03-2016; PNM 20: today  - zostavax 2011;  s/p shingrex - s/p covid vaccines x4 - plans to get a flu shot - RSV d/w pt  -Female care:  saw Dr Nehemiah Settle 10-2015, was rec RTC PRN. MMG 04/2021 ([facility]), plans to proceed yearly  --CCS: normal cscope 2015. Cscope 10/04/2021, next per GI -  DEXA 01-2017 and 2021  normal.  Cont Ca+vitamin D   -Labs:CMP, FLP, CBC, A1c, -diet  and exercise: Doing well. -POA: Discussed

## 2022-05-14 NOTE — Patient Instructions (Addendum)
Vaccines I recommend:  Covid booster RSV vaccine Flu shot   Check the  blood pressure regularly BP GOAL is between 110/65 and  135/85. If it is consistently higher or lower, let me know     GO TO THE LAB : Get the blood work     [place], [place], [place] back for a checkup in 6 months     "Living will", "[place] of attorney": Advanced care planning  (If you already have a living will or healthcare power of attorney, please bring the copy to be scanned in your chart.)  Advance care planning is a process that supports adults in  understanding and sharing their preferences regarding future medical care.   The patient's preferences are recorded in documents called Advance Directives.    Advanced directives are completed (and can be modified at any time) while the patient is in full mental capacity.   The documentation should be available at all times to the patient, the family and the healthcare providers.  Bring in a copy to be scanned in your chart is an excellent idea and is recommended   This legal documents direct treatment decision making and/or appoint a surrogate to make the decision if the patient is not capable to do so.    Advance directives can be documented in many types of formats,  documents have names such as:  Lliving will  Durable power of attorney for healthcare (healthcare proxy or healthcare power of attorney)  Combined directives  Physician orders for life-sustaining treatment    More information at:  [email or web address]

## 2022-05-15 ENCOUNTER — Encounter: Payer: Self-pay | Admitting: Internal Medicine

## 2022-05-15 LAB — CBC WITH DIFFERENTIAL/PLATELET
Basophils Absolute: 0 10*3/uL (ref 0.0–0.1)
Basophils Relative: 0.6 % (ref 0.0–3.0)
Eosinophils Absolute: 0.2 10*3/uL (ref 0.0–0.7)
Eosinophils Relative: 3.2 % (ref 0.0–5.0)
HCT: 39.7 % (ref 36.0–46.0)
Hemoglobin: 13.3 g/dL (ref 12.0–15.0)
Lymphocytes Relative: 44.5 % (ref 12.0–46.0)
Lymphs Abs: 2.1 10*3/uL (ref 0.7–4.0)
MCHC: 33.5 g/dL (ref 30.0–36.0)
MCV: 93.2 fl (ref 78.0–100.0)
Monocytes Absolute: 0.5 10*3/uL (ref 0.1–1.0)
Monocytes Relative: 11.6 % (ref 3.0–12.0)
Neutro Abs: 1.9 10*3/uL (ref 1.4–7.7)
Neutrophils Relative %: 40.1 % — ABNORMAL LOW (ref 43.0–77.0)
Platelets: 154 10*3/uL (ref 150.0–400.0)
RBC: 4.26 Mil/uL (ref 3.87–5.11)
RDW: 13.2 % (ref 11.5–15.5)
WBC: 4.7 10*3/uL (ref 4.0–10.5)

## 2022-05-15 LAB — COMPREHENSIVE METABOLIC PANEL
ALT: 33 U/L (ref 0–35)
AST: 46 U/L — ABNORMAL HIGH (ref 0–37)
Albumin: 4.3 g/dL (ref 3.5–5.2)
Alkaline Phosphatase: 63 U/L (ref 39–117)
BUN: 19 mg/dL (ref 6–23)
CO2: 29 mEq/L (ref 19–32)
Calcium: 10.3 mg/dL (ref 8.4–10.5)
Chloride: 103 mEq/L (ref 96–112)
Creatinine, Ser: 1.06 mg/dL (ref 0.40–1.20)
GFR: 51.55 mL/min — ABNORMAL LOW (ref >60.00)
Glucose, Bld: 83 mg/dL (ref 70–99)
Potassium: 4.6 mEq/L (ref 3.5–5.1)
Sodium: 140 mEq/L (ref 135–145)
Total Bilirubin: 1.3 mg/dL — ABNORMAL HIGH (ref 0.2–1.2)
Total Protein: 6.5 g/dL (ref 6.0–8.3)

## 2022-05-15 LAB — HEMOGLOBIN A1C: Hgb A1c MFr Bld: 6 % (ref 4.6–6.5)

## 2022-05-15 LAB — LIPID PANEL
Cholesterol: 139 mg/dL (ref 0–200)
HDL: 65.9 mg/dL (ref >39.00)
LDL Cholesterol: 54 mg/dL (ref 0–99)
NonHDL: 73.05
Total CHOL/HDL Ratio: 2
Triglycerides: 95 mg/dL (ref 0.0–149.0)
VLDL: 19 mg/dL (ref 0.0–40.0)

## 2022-05-15 LAB — IRON: Iron: 87 ug/dL (ref 42–145)

## 2022-05-15 LAB — FERRITIN: Ferritin: 92.7 ng/mL (ref 10.0–291.0)

## 2022-05-15 NOTE — Assessment & Plan Note (Signed)
Here for CPX Hyperglycemia: Check A1c, has a healthy lifestyle. HTN: BP is very good, continue losartan. High cholesterol: On Crestor, checking labs CAD: On aspirin, no symptoms.  Normal stress echo 2019.   Skin lesions: Refer to Derm  increased LFTs: Has 1 or 2 glasses of wine most days, no Tylenol.  Recheck LFTs, iron ferritin, ANA, anti-smooth muscle antibody, SPEP. RTC 6 months

## 2022-05-15 NOTE — Assessment & Plan Note (Signed)
-  Td 2018 - pnm-23: 2015;  prevnar 03-2016; PNM 20: today  - zostavax 2011;  s/p shingrex - s/p covid vaccines x4 - plans to get a flu shot - RSV d/w pt -Female care:   saw Dr Nehemiah Settle 10-2015, was rec RTC PRN. MMG 04/2021 ([facility]), plans to proceed yearly  --CCS: normal cscope 2015. Cscope 10/04/2021, next per GI -DEXA 01-2017 and 2021  normal.  Cont Ca+vitamin D   -Labs:CMP, FLP, CBC, A1c, -diet  and exercise: Doing well. -POA: Discussed

## 2022-05-18 LAB — PROTEIN ELECTROPHORESIS, SERUM
Albumin ELP: 4.3 g/dL (ref 3.8–4.8)
Alpha 1: 0.2 g/dL (ref 0.2–0.3)
Alpha 2: 0.7 g/dL (ref 0.5–0.9)
Beta 2: 0.3 g/dL (ref 0.2–0.5)
Beta Globulin: 0.3 g/dL — ABNORMAL LOW (ref 0.4–0.6)
Gamma Globulin: 0.7 g/dL — ABNORMAL LOW (ref 0.8–1.7)
Total Protein: 6.5 g/dL (ref 6.1–8.1)

## 2022-05-18 LAB — ANTI-NUCLEAR AB-TITER (ANA TITER): ANA Titer 1: 1:40 {titer} — ABNORMAL HIGH

## 2022-05-18 LAB — ANTI-SMOOTH MUSCLE ANTIBODY, IGG: Actin (Smooth Muscle) Antibody (IGG): <20 U (ref <20)

## 2022-05-18 LAB — ANA: Anti Nuclear Antibody (ANA): POSITIVE — AB

## 2022-05-20 DIAGNOSIS — L821 Other seborrheic keratosis: Secondary | ICD-10-CM | POA: Diagnosis not present

## 2022-05-20 DIAGNOSIS — X32XXXS Exposure to sunlight, sequela: Secondary | ICD-10-CM | POA: Diagnosis not present

## 2022-05-20 DIAGNOSIS — L814 Other melanin hyperpigmentation: Secondary | ICD-10-CM | POA: Diagnosis not present

## 2022-06-05 ENCOUNTER — Other Ambulatory Visit (HOSPITAL_BASED_OUTPATIENT_CLINIC_OR_DEPARTMENT_OTHER): Payer: Self-pay | Admitting: Internal Medicine

## 2022-06-05 DIAGNOSIS — Z1231 Encounter for screening mammogram for malignant neoplasm of breast: Secondary | ICD-10-CM

## 2022-06-10 ENCOUNTER — Ambulatory Visit (HOSPITAL_BASED_OUTPATIENT_CLINIC_OR_DEPARTMENT_OTHER)
Admission: RE | Admit: 2022-06-10 | Discharge: 2022-06-10 | Disposition: A | Discharge status: Home / Self Care | Payer: Medicare HMO | Source: Ambulatory Visit | Attending: Internal Medicine | Admitting: Internal Medicine

## 2022-06-10 ENCOUNTER — Encounter (HOSPITAL_BASED_OUTPATIENT_CLINIC_OR_DEPARTMENT_OTHER): Payer: Self-pay

## 2022-06-10 DIAGNOSIS — Z1231 Encounter for screening mammogram for malignant neoplasm of breast: Secondary | ICD-10-CM | POA: Insufficient documentation

## 2022-07-08 DIAGNOSIS — D2271 Melanocytic nevi of right lower limb, including hip: Secondary | ICD-10-CM | POA: Diagnosis not present

## 2022-07-08 DIAGNOSIS — D692 Other nonthrombocytopenic purpura: Secondary | ICD-10-CM | POA: Diagnosis not present

## 2022-07-08 DIAGNOSIS — D225 Melanocytic nevi of trunk: Secondary | ICD-10-CM | POA: Diagnosis not present

## 2022-07-08 DIAGNOSIS — D2272 Melanocytic nevi of left lower limb, including hip: Secondary | ICD-10-CM | POA: Diagnosis not present

## 2022-07-08 DIAGNOSIS — L814 Other melanin hyperpigmentation: Secondary | ICD-10-CM | POA: Diagnosis not present

## 2022-07-08 DIAGNOSIS — L718 Other rosacea: Secondary | ICD-10-CM | POA: Diagnosis not present

## 2022-07-08 DIAGNOSIS — L821 Other seborrheic keratosis: Secondary | ICD-10-CM | POA: Diagnosis not present

## 2022-07-08 DIAGNOSIS — D2239 Melanocytic nevi of other parts of face: Secondary | ICD-10-CM | POA: Diagnosis not present

## 2022-07-08 DIAGNOSIS — X32XXXS Exposure to sunlight, sequela: Secondary | ICD-10-CM | POA: Diagnosis not present

## 2022-09-01 ENCOUNTER — Other Ambulatory Visit: Payer: Self-pay | Admitting: Internal Medicine

## 2022-10-02 ENCOUNTER — Other Ambulatory Visit: Payer: Self-pay | Admitting: Internal Medicine

## 2022-11-12 ENCOUNTER — Ambulatory Visit (INDEPENDENT_AMBULATORY_CARE_PROVIDER_SITE_OTHER): Payer: Medicare HMO | Admitting: Internal Medicine

## 2022-11-12 ENCOUNTER — Encounter: Payer: Self-pay | Admitting: Internal Medicine

## 2022-11-12 VITALS — BP 126/74 | HR 45 | Temp 97.6°F | Resp 16 | Ht 63.0 in | Wt 148.0 lb

## 2022-11-12 DIAGNOSIS — E785 Hyperlipidemia, unspecified: Secondary | ICD-10-CM | POA: Diagnosis not present

## 2022-11-12 DIAGNOSIS — I10 Essential (primary) hypertension: Secondary | ICD-10-CM | POA: Diagnosis not present

## 2022-11-12 DIAGNOSIS — R7989 Other specified abnormal findings of blood chemistry: Secondary | ICD-10-CM | POA: Diagnosis not present

## 2022-11-12 LAB — BASIC METABOLIC PANEL
BUN: 15 mg/dL (ref 6–23)
CO2: 29 mEq/L (ref 19–32)
Calcium: 10.3 mg/dL (ref 8.4–10.5)
Chloride: 101 mEq/L (ref 96–112)
Creatinine, Ser: 0.89 mg/dL (ref 0.40–1.20)
GFR: 63.35 mL/min (ref >60.00)
Glucose, Bld: 113 mg/dL — ABNORMAL HIGH (ref 70–99)
Potassium: 4.3 mEq/L (ref 3.5–5.1)
Sodium: 139 mEq/L (ref 135–145)

## 2022-11-12 NOTE — Progress Notes (Unsigned)
   Subjective:    Patient ID: Kara Wheeler, female    DOB: [birth year], 77 y.o.   MRN: [id number]  DOS:  11/12/2022 Type of visit - description: Routine checkup  Visit last office visit is doing well and has no major concerns or other than occasional low back pain, typically the end of the day. Denies any other symptoms.  Review of Systems See above   Past Medical History:  Diagnosis Date   Ankle fracture, right 2013   Coronary atherosclerosis of native coronary artery 2005   Taxus drug-eluting stent to treat severe stenosis in the proximal LAD   HTN (hypertension)    Mixed dyslipidemia    Osteoarthritis     Past Surgical History:  Procedure Laterality Date   CERVICAL CONE BIOPSY  1978   benign   CORONARY STENT PLACEMENT  11/2003   LAD   TUBAL LIGATION Bilateral 1980    Current Outpatient Medications  Medication Instructions   aspirin 81 mg, Daily   Cholecalciferol (VITAMIN D-3 PO) 500 Units, Oral, Daily   losartan (COZAAR) 50 mg, Oral, Daily   Multiple Vitamin (MULTI VITAMIN) TABS 1 tablet, Oral, Daily   rosuvastatin (CRESTOR) 40 mg, Oral, Daily at bedtime       Objective:   Physical Exam BP 126/74   Pulse (!) 45   Temp 97.6 F (36.4 C) (Oral)   Resp 16   Ht 5\' 3"  (1.6 m)   Wt 148 lb (67.1 kg)   SpO2 97%   BMI 26.22 kg/m  General:   Well developed, NAD, BMI noted. HEENT:  Normocephalic . Face symmetric, atraumatic Lungs:  CTA B Normal respiratory effort, no intercostal retractions, no accessory muscle use. Heart: RRR,  no murmur.  Lower extremities: no pretibial edema bilaterally  Skin: Not pale. Not jaundice Neurologic:  alert & oriented X3.  Speech normal, gait appropriate for age and unassisted Psych--  Cognition and judgment appear intact.  Cooperative with normal attention span and concentration.  Behavior appropriate. No anxious or depressed appearing.      Assessment    Assessment Hyper glycemia A1c 6.0  (02-2017) HTN Dyslipidemia Elevated LFTs: Mild, chronic -hep B&C (-)  - 04-2022:Electrophoresis, AMA, ferritin negative.  ANA mildly positive CV: Dr Burt Knack  CAD dx 2005, was asx >> stent  H/o false  Positive stress-EKG Myoview 2013 negative.  stress echo normal 11-2014 Normal echo stress test 08-2017 H/o abnormal PAP, cone bx (remotely) H/o Fx distal fibula -R- 2013, no surgery  PLAN HTN: BP today is very good, recommend no change, on losartan, check BMP. High cholesterol: On Crestor, well-controlled per last FLP Chronic increased LFTs: See LOV, blood work negative except for slightly positive ANA, doubt of clinical significance Aspirin: No known history of CAD or stroke.  77 years old, okay to stop aspirin.  RTC 6 months cpx  Here for CPX Hyperglycemia: Check A1c, has a healthy lifestyle. HTN: BP is very good, continue losartan. High cholesterol: On Crestor, checking labs CAD: On aspirin, no symptoms.  Normal stress echo 2019.   Skin lesions: Refer to Derm  increased LFTs: Has 1 or 2 glasses of wine most days, no Tylenol.  Recheck LFTs, iron ferritin, ANA, anti-smooth muscle antibody, SPEP. RTC 6 months

## 2022-11-12 NOTE — Patient Instructions (Addendum)
Vaccines I recommend: Covid booster RSV vaccine  Okay to stop aspirin  GO TO THE LAB : Get the blood work     [place], [place] Come back for for a physical exam in approximately 6 months

## 2022-11-13 NOTE — Assessment & Plan Note (Signed)
HTN: BP today is very good, recommend no change, on losartan, check BMP. High cholesterol: On Crestor, well-controlled per last FLP Chronic increased LFTs: See LOV, blood work negative except for slightly positive ANA, doubt of clinical significance Aspirin: No known history of CAD or stroke.  77 years old, okay to stop aspirin.  RTC 6 months cpx

## 2022-12-06 ENCOUNTER — Encounter: Payer: Self-pay | Admitting: Internal Medicine

## 2022-12-09 ENCOUNTER — Encounter: Payer: Self-pay | Admitting: *Deleted

## 2023-02-17 ENCOUNTER — Other Ambulatory Visit: Payer: Self-pay | Admitting: Internal Medicine

## 2023-03-23 ENCOUNTER — Other Ambulatory Visit: Payer: Self-pay | Admitting: Internal Medicine

## 2023-05-01 ENCOUNTER — Telehealth: Payer: Self-pay | Admitting: Internal Medicine

## 2023-05-01 NOTE — Telephone Encounter (Signed)
Copied from CRM [id number]. Topic: Medicare AWV >> May 01, 2023  2:24 PM Payton Doughty wrote: Reason for CRM: LM 05/01/2023 to schedule AWV   Verlee Rossetti; Care Guide Ambulatory Clinical Support [place] [facility] Direct Dial: [phone]

## 2023-05-07 ENCOUNTER — Ambulatory Visit (INDEPENDENT_AMBULATORY_CARE_PROVIDER_SITE_OTHER): Payer: Medicare HMO | Admitting: *Deleted

## 2023-05-07 DIAGNOSIS — Z0000 Encounter for general adult medical examination without abnormal findings: Secondary | ICD-10-CM | POA: Diagnosis not present

## 2023-05-07 DIAGNOSIS — Z780 Asymptomatic menopausal state: Secondary | ICD-10-CM

## 2023-05-07 NOTE — Patient Instructions (Signed)
Ms. Vannostrand , Thank you for taking time to come for your Medicare Wellness Visit. I appreciate your ongoing commitment to your health goals. Please review the following plan we discussed and let me know if I can assist you in the future.     This is a list of the screening recommended for you and due dates:  Health Maintenance  Topic Date Due   Flu Shot  03/27/2023   COVID-19 Vaccine (6 - 2023-24 season) 04/27/2023   Mammogram  06/11/2023   Medicare Annual Wellness Visit  05/06/2024   DTaP/Tdap/Td vaccine (3 - Td or Tdap) 03/06/2027   Colon Cancer Screening  10/04/2028   Pneumonia Vaccine  Completed   DEXA scan (bone density measurement)  Completed   Hepatitis C Screening  Completed   Zoster (Shingles) Vaccine  Completed   HPV Vaccine  Aged Out     Next appointment: Follow up in one year for your annual wellness visit.   Preventive Care 45 Years and Older, Female Preventive care refers to lifestyle choices and visits with your health care provider that can promote health and wellness. What does preventive care include? A yearly physical exam. This is also called an annual well check. Dental exams once or twice a year. Routine eye exams. Ask your health care provider how often you should have your eyes checked. Personal lifestyle choices, including: Daily care of your teeth and gums. Regular physical activity. Eating a healthy diet. Avoiding tobacco and drug use. Limiting alcohol use. Practicing safe sex. Taking low-dose aspirin every day. Taking vitamin and mineral supplements as recommended by your health care provider. What happens during an annual well check? The services and screenings done by your health care provider during your annual well check will depend on your age, overall health, lifestyle risk factors, and family history of disease. Counseling  Your health care provider may ask you questions about your: Alcohol use. Tobacco use. Drug use. Emotional  well-being. Home and relationship well-being. Sexual activity. Eating habits. History of falls. Memory and ability to understand (cognition). Work and work Astronomer. Reproductive health. Screening  You may have the following tests or measurements: Height, weight, and BMI. Blood pressure. Lipid and cholesterol levels. These may be checked every 5 years, or more frequently if you are over 29 years old. Skin check. Lung cancer screening. You may have this screening every year starting at age 46 if you have a 30-pack-year history of smoking and currently smoke or have quit within the past 15 years. Fecal occult blood test (FOBT) of the stool. You may have this test every year starting at age 77. Flexible sigmoidoscopy or colonoscopy. You may have a sigmoidoscopy every 5 years or a colonoscopy every 10 years starting at age 63. Hepatitis C blood test. Hepatitis B blood test. Sexually transmitted disease (STD) testing. Diabetes screening. This is done by checking your blood sugar (glucose) after you have not eaten for a while (fasting). You may have this done every 1-3 years. Bone density scan. This is done to screen for osteoporosis. You may have this done starting at age 5. Mammogram. This may be done every 1-2 years. Talk to your health care provider about how often you should have regular mammograms. Talk with your health care provider about your test results, treatment options, and if necessary, the need for more tests. Vaccines  Your health care provider may recommend certain vaccines, such as: Influenza vaccine. This is recommended every year. Tetanus, diphtheria, and acellular pertussis (Tdap, Td)  vaccine. You may need a Td booster every 10 years. Zoster vaccine. You may need this after age 39. Pneumococcal 13-valent conjugate (PCV13) vaccine. One dose is recommended after age 64. Pneumococcal polysaccharide (PPSV23) vaccine. One dose is recommended after age 58. Talk to your  health care provider about which screenings and vaccines you need and how often you need them. This information is not intended to replace advice given to you by your health care provider. Make sure you discuss any questions you have with your health care provider. Document Released: 09/08/2015 Document Revised: 05/01/2016 Document Reviewed: 06/13/2015 Elsevier Interactive Patient Education  2017 [employer].  Fall Prevention in the Home Falls can cause injuries. They can happen to people of all ages. There are many things you can do to make your home safe and to help prevent falls. What can I do on the outside of my home? Regularly fix the edges of walkways and driveways and fix any cracks. Remove anything that might make you trip as you walk through a door, such as a raised step or threshold. Trim any bushes or trees on the path to your home. Use bright outdoor lighting. Clear any walking paths of anything that might make someone trip, such as rocks or tools. Regularly check to see if handrails are loose or broken. Make sure that both sides of any steps have handrails. Any raised decks and porches should have guardrails on the edges. Have any leaves, snow, or ice cleared regularly. Use sand or salt on walking paths during winter. Clean up any spills in your garage right away. This includes oil or grease spills. What can I do in the bathroom? Use night lights. Install grab bars by the toilet and in the tub and shower. Do not use towel bars as grab bars. Use non-skid mats or decals in the tub or shower. If you need to sit down in the shower, use a plastic, non-slip stool. Keep the floor dry. Clean up any water that spills on the floor as soon as it happens. Remove soap buildup in the tub or shower regularly. Attach bath mats securely with double-sided non-slip rug tape. Do not have throw rugs and other things on the floor that can make you trip. What can I do in the bedroom? Use night  lights. Make sure that you have a light by your bed that is easy to reach. Do not use any sheets or blankets that are too big for your bed. They should not hang down onto the floor. Have a firm chair that has side arms. You can use this for support while you get dressed. Do not have throw rugs and other things on the floor that can make you trip. What can I do in the kitchen? Clean up any spills right away. Avoid walking on wet floors. Keep items that you use a lot in easy-to-reach places. If you need to reach something above you, use a strong step stool that has a grab bar. Keep electrical cords out of the way. Do not use floor polish or wax that makes floors slippery. If you must use wax, use non-skid floor wax. Do not have throw rugs and other things on the floor that can make you trip. What can I do with my stairs? Do not leave any items on the stairs. Make sure that there are handrails on both sides of the stairs and use them. Fix handrails that are broken or loose. Make sure that handrails are as long  as the stairways. Check any carpeting to make sure that it is firmly attached to the stairs. Fix any carpet that is loose or worn. Avoid having throw rugs at the top or bottom of the stairs. If you do have throw rugs, attach them to the floor with carpet tape. Make sure that you have a light switch at the top of the stairs and the bottom of the stairs. If you do not have them, ask someone to add them for you. What else can I do to help prevent falls? Wear shoes that: Do not have high heels. Have rubber bottoms. Are comfortable and fit you well. Are closed at the toe. Do not wear sandals. If you use a stepladder: Make sure that it is fully opened. Do not climb a closed stepladder. Make sure that both sides of the stepladder are locked into place. Ask someone to hold it for you, if possible. Clearly mark and make sure that you can see: Any grab bars or handrails. First and last  steps. Where the edge of each step is. Use tools that help you move around (mobility aids) if they are needed. These include: Canes. Walkers. Scooters. Crutches. Turn on the lights when you go into a dark area. Replace any light bulbs as soon as they burn out. Set up your furniture so you have a clear path. Avoid moving your furniture around. If any of your floors are uneven, fix them. If there are any pets around you, be aware of where they are. Review your medicines with your doctor. Some medicines can make you feel dizzy. This can increase your chance of falling. Ask your doctor what other things that you can do to help prevent falls. This information is not intended to replace advice given to you by your health care provider. Make sure you discuss any questions you have with your health care provider. Document Released: 06/08/2009 Document Revised: 01/18/2016 Document Reviewed: 09/16/2014 Elsevier Interactive Patient Education  2017 [employer].

## 2023-05-07 NOTE — Progress Notes (Signed)
Subjective:   Kara Wheeler is a 77 y.o. female who presents for Medicare Annual (Subsequent) preventive examination.  Visit Complete: Virtual  I connected with  Anders Grant on 05/07/23 by a audio enabled telemedicine application and verified that I am speaking with the correct person using two identifiers.  Patient Location: Home  Provider Location: Office/Clinic  I discussed the limitations of evaluation and management by telemedicine. The patient expressed understanding and agreed to proceed.  Patient Medicare AWV questionnaire was completed by the patient on 05/05/23; I have confirmed that all information answered by patient is correct and no changes since this date.  Review of Systems     Cardiac Risk Factors include: advanced age (>12men, >53 women);dyslipidemia;hypertension     Objective:    Vital Signs: Unable to obtain new vitals due to this being a telehealth visit.      05/07/2023    1:03 PM 05/01/2022    2:59 PM 04/04/2021    2:19 PM 07/11/2020   10:20 AM 03/07/2020    8:52 AM 02/17/2018    1:15 PM 01/28/2017    8:58 AM  Advanced Directives  Does Patient Have a Medical Advance Directive? No No No Yes Yes No No  Type of Aeronautical engineer of [place];Living will Healthcare Power of [place];Living will    Does patient want to make changes to medical advance directive?     No - Patient declined    Copy of Healthcare Power of Attorney in Chart?    No - copy requested No - copy requested    Would patient like information on creating a medical advance directive? No - Patient declined No - Patient declined No - Patient declined   Yes (MAU/Ambulatory/Procedural Areas - Information given) Yes (MAU/Ambulatory/Procedural Areas - Information given)    Current Medications (verified) Outpatient Encounter Medications as of 05/07/2023  Medication Sig   Cholecalciferol (VITAMIN D-3 PO) Take 500 Units by mouth daily.    losartan (COZAAR) 50 MG tablet  Take 1 tablet (50 mg total) by mouth daily. Due for cpx 04/2023   Multiple Vitamin (MULTI VITAMIN) TABS Take 1 tablet by mouth daily.   rosuvastatin (CRESTOR) 40 MG tablet Take 1 tablet (40 mg total) by mouth at bedtime. Due for cpx 04/2023   No facility-administered encounter medications on file as of 05/07/2023.    Allergies (verified) Patient has no known allergies.   History: Past Medical History:  Diagnosis Date   Ankle fracture, right 2013   Coronary atherosclerosis of native coronary artery 2005   Taxus drug-eluting stent to treat severe stenosis in the proximal LAD   HTN (hypertension)    Mixed dyslipidemia    Osteoarthritis    Past Surgical History:  Procedure Laterality Date   CERVICAL CONE BIOPSY  1978   benign   CORONARY STENT PLACEMENT  11/2003   LAD   TUBAL LIGATION Bilateral 1980   Family History  Problem Relation Age of Onset   Lymphoma Mother        Lymphoma   Cancer Mother    Early death Mother    Heart attack Father 8       second at age 25   Early death Father    Heart disease Father    Heart disease Brother    Colon cancer Brother 51   Atrial fibrillation Brother    Heart attack Paternal Grandmother    Stroke Paternal Grandmother    Heart attack Paternal Grandfather  Stroke Paternal Grandfather    Breast cancer Neg Hx    Diabetes Neg Hx    Social History   Socioeconomic History   Marital status: Married    Spouse name: Not on file   Number of children: 1   Years of education: Not on file   Highest education level: Not on file  Occupational History   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  Tobacco Use   Smoking status: Former    Current packs/day: 0.00    Average packs/day: 0.3 packs/day for 4.0 years (1.0 ttl pk-yrs)    Types: Cigarettes    Start date: 08/26/1962    Quit date: 08/26/1966    Years since quitting: 56.7   Smokeless tobacco: Never  Substance and Sexual Activity   Alcohol use: Yes    Alcohol/week: 2.0 - 3.0  standard drinks of alcohol    Types: 2 - 3 Glasses of wine per week    Comment: cut back on wine this past year   Drug use: No    Comment: marijuana -- very little yrs ago   Sexual activity: Yes    Partners: Male    Birth control/protection: Surgical, Post-menopausal    Comment: tubal  Other Topics Concern   Not on file  Social History Narrative   Household-- pt and husband (Mr Annawan)   Social Determinants of Health   Financial Resource Strain: Low Risk  (05/05/2023)   Overall Financial Resource Strain (CARDIA)    Difficulty of Paying Living Expenses: Not hard at all  Food Insecurity: No Food Insecurity (05/05/2023)   Hunger Vital Sign    Worried About Running Out of Food in the Last Year: Never true    Ran Out of Food in the Last Year: Never true  Transportation Needs: No Transportation Needs (05/05/2023)   PRAPARE - Administrator, Civil Service (Medical): No    Lack of Transportation (Non-Medical): No  Physical Activity: Sufficiently Active (05/05/2023)   Exercise Vital Sign    Days of Exercise per Week: 5 days    Minutes of Exercise per Session: 40 min  Stress: No Stress Concern Present (05/05/2023)   [employer] of Occupational Health - Occupational Stress Questionnaire    Feeling of Stress : Only a little  Social Connections: Unknown (05/05/2023)   Social Connection and Isolation Panel [NHANES]    Frequency of Communication with Friends and Family: Twice a week    Frequency of Social Gatherings with Friends and Family: Twice a week    Attends Religious Services: Not on Marketing executive or Organizations: Yes    Attends Banker Meetings: 1 to 4 times per year    Marital Status: Married    Tobacco Counseling Counseling given: Not Answered   Clinical Intake:  Pre-visit preparation completed: Yes  Pain : No/denies pain  Nutritional Risks: None Diabetes: No  How often do you need to have someone help you when you read  instructions, pamphlets, or other written materials from your doctor or pharmacy?: 1 - Never  Interpreter Needed?: No  Information entered by :: [employer], CMA   Activities of Daily Living    05/05/2023   12:24 PM  In your present state of health, do you have any difficulty performing the following activities:  Hearing? 0  Vision? 0  Difficulty concentrating or making decisions? 1  Walking or climbing stairs? 0  Dressing or bathing? 0  Doing errands, shopping?  0  Preparing Food and eating ? N  Using the Toilet? N  In the past six months, have you accidently leaked urine? Y  Do you have problems with loss of bowel control? N  Managing your Medications? N  Managing your Finances? N  Housekeeping or managing your Housekeeping? N    Patient Care Team: Wanda Plump, MD as PCP - General (Internal Medicine) Tonny Bollman, MD as PCP - Cardiology (Cardiology) Juline Patch, MD as Consulting Physician (Internal Medicine) Levie Heritage, DO as Consulting Physician (Gynecology) Jacalyn Lefevre, OD as Consulting Physician (Optometry) Bernette Redbird, MD as Consulting Physician (Gastroenterology)  Indicate any recent Medical Services you may have received from other than [facility] providers in the past year (date may be approximate).     Assessment:   This is a routine wellness examination for [facility].  Hearing/Vision screen No results found.   Goals Addressed   None    Depression Screen    05/07/2023    1:06 PM 11/12/2022    1:02 PM 05/14/2022    2:54 PM 05/01/2022    3:02 PM 11/06/2021    9:54 AM 04/04/2021    2:27 PM 04/04/2021    1:40 PM  PHQ 2/9 Scores  PHQ - 2 Score 0 0 0 0 0 0 0    Fall Risk    05/05/2023   12:24 PM 11/12/2022    1:01 PM 05/14/2022    2:54 PM 05/01/2022    3:00 PM 11/06/2021    9:54 AM  Fall Risk   Falls in the past year? 0 0 0 0 0  Number falls in past yr: 0 0 0 0 0  Injury with Fall? 0 0 0 0 0  Risk for fall due to : No Fall Risks   No Fall Risks    Follow up Falls evaluation completed Falls evaluation completed Falls evaluation completed Falls evaluation completed Falls evaluation completed    MEDICARE RISK AT HOME: Medicare Risk at Home Any stairs in or around the home?: Yes If so, are there any without handrails?: No Home free of loose throw rugs in walkways, pet beds, electrical cords, etc?: Yes Adequate lighting in your home to reduce risk of falls?: Yes Life alert?: No Use of a cane, walker or w/c?: No Grab bars in the bathroom?: Yes Shower chair or bench in shower?: No Elevated toilet seat or a handicapped toilet?: No  TIMED UP AND GO:  Was the test performed?  No    Cognitive Function:    01/28/2017    9:00 AM  MMSE - Mini Mental State Exam  Orientation to time 5  Orientation to Place 5  Registration 3  Attention/ Calculation 5  Recall 3  Language- name 2 objects 2  Language- repeat 1  Language- follow 3 step command 3  Language- read & follow direction 1  Write a sentence 1  Copy design 1  Total score 30        05/07/2023    1:09 PM 05/01/2022    3:14 PM  6CIT Screen  What Year? 0 points 0 points  What month? 0 points 0 points  What time? 0 points 0 points  Count back from 20 0 points 0 points  Months in reverse 0 points 0 points  Repeat phrase 0 points 0 points  Total Score 0 points 0 points    Immunizations Immunization History  Administered Date(s) Administered   Fluad Quad(high Dose 65+) 06/08/2022   Influenza Split  05/15/2018   Influenza, High Dose Seasonal PF 05/27/2019, 06/06/2021   Influenza-Unspecified 05/26/2015, 05/26/2016, 06/04/2017, 05/25/2020   Moderna Covid-19 Vaccine Bivalent Booster 45yrs & up 06/20/2021   Moderna Sars-Covid-2 Vaccination 09/26/2019, 10/25/2019, 06/27/2020, 12/12/2020   PNEUMOCOCCAL CONJUGATE-20 05/14/2022   Pneumococcal Conjugate-13 04/02/2016   Pneumococcal Polysaccharide-23 08/17/2014   Td 03/05/2017   Tdap 04/21/2007   Zoster Recombinant(Shingrix)  03/18/2019, 07/17/2019   Zoster, Live 08/26/2009    TDAP status: Up to date  Flu Vaccine status: Due, Education has been provided regarding the importance of this vaccine. Advised may receive this vaccine at local pharmacy or Health Dept. Aware to provide a copy of the vaccination record if obtained from local pharmacy or Health Dept. Verbalized acceptance and understanding.  Pneumococcal vaccine status: Up to date  Covid-19 vaccine status: Information provided on how to obtain vaccines.   Qualifies for Shingles Vaccine? Yes   Zostavax completed Yes   Shingrix Completed?: Yes  Screening Tests Health Maintenance  Topic Date Due   INFLUENZA VACCINE  03/27/2023   COVID-19 Vaccine (6 - 2023-24 season) 04/27/2023   Medicare Annual Wellness (AWV)  05/02/2023   MAMMOGRAM  06/11/2023   DTaP/Tdap/Td (3 - Td or Tdap) 03/06/2027   Colonoscopy  10/04/2028   Pneumonia Vaccine 25+ Years old  Completed   DEXA SCAN  Completed   Hepatitis C Screening  Completed   Zoster Vaccines- Shingrix  Completed   HPV VACCINES  Aged Out    Health Maintenance  Health Maintenance Due  Topic Date Due   INFLUENZA VACCINE  03/27/2023   COVID-19 Vaccine (6 - 2023-24 season) 04/27/2023   Medicare Annual Wellness (AWV)  05/02/2023    Colorectal cancer screening: Type of screening: Colonoscopy. Completed 10/04/21. Repeat every 7 years  Mammogram status: Completed 06/10/22. Repeat every year  Bone Density status: Ordered 05/07/23. Pt provided with contact info and advised to call to schedule appt.  Lung Cancer Screening: (Low Dose CT Chest recommended if Age 57-80 years, 20 pack-year currently smoking OR have quit w/in 15years.) does not qualify.    Additional Screening:  Hepatitis C Screening: does qualify; Completed 06/13/21  Vision Screening: Recommended annual ophthalmology exams for early detection of glaucoma and other disorders of the eye. Is the patient up to date with their annual eye exam?  No  but has an exam scheduled for November Who is the provider or what is the name of the office in which the patient attends annual eye exams? [facility] If pt is not established with a provider, would they like to be referred to a provider to establish care? No .   Dental Screening: Recommended annual dental exams for proper oral hygiene  Diabetic Foot Exam: N/a  Community Resource Referral / Chronic Care Management: CRR required this visit?  No   CCM required this visit?  No     Plan:     I have personally reviewed and noted the following in the patient's chart:   Medical and social history Use of alcohol, tobacco or illicit drugs  Current medications and supplements including opioid prescriptions. Patient is not currently taking opioid prescriptions. Functional ability and status Nutritional status Physical activity Advanced directives List of other physicians Hospitalizations, surgeries, and ER visits in previous 12 months Vitals Screenings to include cognitive, depression, and falls Referrals and appointments  In addition, I have reviewed and discussed with patient certain preventive protocols, quality metrics, and best practice recommendations. A written personalized care plan for preventive services as well  as general preventive health recommendations were provided to patient.     Donne Anon, CMA   05/07/2023   After Visit Summary: (MyChart) Due to this being a telephonic visit, the after visit summary with patients personalized plan was offered to patient via MyChart   Nurse Notes: None

## 2023-05-16 ENCOUNTER — Other Ambulatory Visit (HOSPITAL_BASED_OUTPATIENT_CLINIC_OR_DEPARTMENT_OTHER): Payer: Self-pay | Admitting: Internal Medicine

## 2023-05-16 ENCOUNTER — Other Ambulatory Visit: Payer: Self-pay | Admitting: Internal Medicine

## 2023-05-16 DIAGNOSIS — Z1231 Encounter for screening mammogram for malignant neoplasm of breast: Secondary | ICD-10-CM

## 2023-06-09 ENCOUNTER — Other Ambulatory Visit: Payer: Self-pay | Admitting: Internal Medicine

## 2023-06-16 ENCOUNTER — Ambulatory Visit (HOSPITAL_BASED_OUTPATIENT_CLINIC_OR_DEPARTMENT_OTHER)
Admission: RE | Admit: 2023-06-16 | Discharge: 2023-06-16 | Disposition: A | Discharge status: Home / Self Care | Payer: Medicare HMO | Source: Ambulatory Visit | Attending: Internal Medicine | Admitting: Internal Medicine

## 2023-06-16 ENCOUNTER — Encounter (HOSPITAL_BASED_OUTPATIENT_CLINIC_OR_DEPARTMENT_OTHER): Payer: Self-pay

## 2023-06-16 DIAGNOSIS — Z1231 Encounter for screening mammogram for malignant neoplasm of breast: Secondary | ICD-10-CM | POA: Insufficient documentation

## 2023-06-16 DIAGNOSIS — Z780 Asymptomatic menopausal state: Secondary | ICD-10-CM

## 2023-06-16 DIAGNOSIS — M8589 Other specified disorders of bone density and structure, multiple sites: Secondary | ICD-10-CM | POA: Diagnosis not present

## 2023-06-26 ENCOUNTER — Other Ambulatory Visit: Payer: Self-pay | Admitting: Internal Medicine

## 2023-07-09 DIAGNOSIS — H5203 Hypermetropia, bilateral: Secondary | ICD-10-CM | POA: Diagnosis not present

## 2023-07-29 ENCOUNTER — Encounter: Payer: Medicare HMO | Admitting: Internal Medicine

## 2023-08-06 DIAGNOSIS — Z129 Encounter for screening for malignant neoplasm, site unspecified: Secondary | ICD-10-CM | POA: Diagnosis not present

## 2023-08-06 DIAGNOSIS — D2239 Melanocytic nevi of other parts of face: Secondary | ICD-10-CM | POA: Diagnosis not present

## 2023-08-06 DIAGNOSIS — D2271 Melanocytic nevi of right lower limb, including hip: Secondary | ICD-10-CM | POA: Diagnosis not present

## 2023-08-06 DIAGNOSIS — D225 Melanocytic nevi of trunk: Secondary | ICD-10-CM | POA: Diagnosis not present

## 2023-08-06 DIAGNOSIS — L570 Actinic keratosis: Secondary | ICD-10-CM | POA: Diagnosis not present

## 2023-08-06 DIAGNOSIS — L821 Other seborrheic keratosis: Secondary | ICD-10-CM | POA: Diagnosis not present

## 2023-08-06 DIAGNOSIS — D2272 Melanocytic nevi of left lower limb, including hip: Secondary | ICD-10-CM | POA: Diagnosis not present

## 2023-08-06 DIAGNOSIS — L814 Other melanin hyperpigmentation: Secondary | ICD-10-CM | POA: Diagnosis not present

## 2023-09-03 ENCOUNTER — Ambulatory Visit (INDEPENDENT_AMBULATORY_CARE_PROVIDER_SITE_OTHER): Payer: Medicare HMO | Admitting: Internal Medicine

## 2023-09-03 ENCOUNTER — Encounter: Payer: Self-pay | Admitting: Internal Medicine

## 2023-09-03 VITALS — BP 136/82 | HR 87 | Temp 97.9°F | Ht 63.0 in | Wt 144.0 lb

## 2023-09-03 DIAGNOSIS — Z0000 Encounter for general adult medical examination without abnormal findings: Secondary | ICD-10-CM | POA: Diagnosis not present

## 2023-09-03 DIAGNOSIS — Z0001 Encounter for general adult medical examination with abnormal findings: Secondary | ICD-10-CM

## 2023-09-03 DIAGNOSIS — M8580 Other specified disorders of bone density and structure, unspecified site: Secondary | ICD-10-CM | POA: Insufficient documentation

## 2023-09-03 DIAGNOSIS — R739 Hyperglycemia, unspecified: Secondary | ICD-10-CM

## 2023-09-03 DIAGNOSIS — I10 Essential (primary) hypertension: Secondary | ICD-10-CM

## 2023-09-03 DIAGNOSIS — M8588 Other specified disorders of bone density and structure, other site: Secondary | ICD-10-CM | POA: Diagnosis not present

## 2023-09-03 DIAGNOSIS — I2510 Atherosclerotic heart disease of native coronary artery without angina pectoris: Secondary | ICD-10-CM | POA: Diagnosis not present

## 2023-09-03 MED ORDER — ALENDRONATE SODIUM 70 MG PO TABS: 70.0000 mg | ORAL_TABLET | ORAL | 11 refills | Status: DC

## 2023-09-03 NOTE — Progress Notes (Signed)
 Subjective:    Patient ID: Kara Wheeler, female    DOB: 1946-03-06, 78 y.o.   MRN: [id number]  DOS:  09/03/2023 Type of visit - description: cpx  Here for CPE. 5 days ago developed a URI, on OTCs, currently feeling much better.  Never had fever, residual symptom: Runny nose.  Denies chest pain or difficulty breathing. Occasionally incontinent of urine right before she reaches the bathroom.  Denies blood in the urine, vaginal discharge or vaginal bleeding. Occasionally has pain at the right posterior hip, mostly at night, better with OTCs. Other than that she is active, goes to the gym, does some hiking.   Review of Systems  Other than above, a 14 point review of systems is negative     Past Medical History:  Diagnosis Date   Ankle fracture, right 2013   Coronary atherosclerosis of native coronary artery 2005   Taxus drug-eluting stent to treat severe stenosis in the proximal LAD   HTN (hypertension)    Mixed dyslipidemia    Osteoarthritis     Past Surgical History:  Procedure Laterality Date   CERVICAL CONE BIOPSY  1978   benign   CORONARY STENT PLACEMENT  11/2003   LAD   TUBAL LIGATION Bilateral 1980   Social History   Socioeconomic History   Marital status: Married    Spouse name: Not on file   Number of children: 1   Years of education: Not on file   Highest education level: Not on file  Occupational History   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  Tobacco Use   Smoking status: Former    Current packs/day: 0.00    Average packs/day: 0.3 packs/day for 4.0 years (1.0 ttl pk-yrs)    Types: Cigarettes    Start date: 08/26/1962    Quit date: 08/26/1966    Years since quitting: 57.0   Smokeless tobacco: Never  Substance and Sexual Activity   Alcohol use: Yes    Alcohol/week: 2.0 - 3.0 standard drinks of alcohol    Types: 2 - 3 Glasses of wine per week    Comment: wine socially   Drug use: No    Comment: marijuana -- very little yrs ago    Sexual activity: Yes    Partners: Male    Birth control/protection: Surgical, Post-menopausal    Comment: tubal  Other Topics Concern   Not on file  Social History Narrative   Household-- pt and husband (Mr [place])   Social Drivers of Health   Financial Resource Strain: Low Risk  (05/05/2023)   Overall Financial Resource Strain (CARDIA)    Difficulty of Paying Living Expenses: Not hard at all  Food Insecurity: No Food Insecurity (05/05/2023)   Hunger Vital Sign    Worried About Running Out of Food in the Last Year: Never true    Ran Out of Food in the Last Year: Never true  Transportation Needs: No Transportation Needs (05/05/2023)   PRAPARE - Administrator, Civil Service (Medical): No    Lack of Transportation (Non-Medical): No  Physical Activity: Sufficiently Active (05/05/2023)   Exercise Vital Sign    Days of Exercise per Week: 5 days    Minutes of Exercise per Session: 40 min  Stress: No Stress Concern Present (05/05/2023)   [employer] of Occupational Health - Occupational Stress Questionnaire    Feeling of Stress : Only a little  Social Connections: Unknown (05/05/2023)   Social Connection and Isolation Panel [NHANES]  Frequency of Communication with Friends and Family: Twice a week    Frequency of Social Gatherings with Friends and Family: Twice a week    Attends Religious Services: Not on Marketing Executive or Organizations: Yes    Attends Banker Meetings: 1 to 4 times per year    Marital Status: Married  Catering Manager Violence: Not At Risk (05/07/2023)   Humiliation, Afraid, Rape, and Kick questionnaire    Fear of Current or Ex-Partner: No    Emotionally Abused: No    Physically Abused: No    Sexually Abused: No    Current Outpatient Medications  Medication Instructions   alendronate  (FOSAMAX ) 70 mg, Oral, Every 7 days, Take with a full glass of water on an empty stomach.   aspirin EC 81 mg, Daily   Cholecalciferol  (VITAMIN D -3 PO) 500 Units, Daily   losartan  (COZAAR ) 50 mg, Oral, Daily   Multiple Vitamin (MULTI VITAMIN) TABS 1 tablet, Daily   rosuvastatin  (CRESTOR ) 40 mg, Oral, Daily at bedtime       Objective:   Physical Exam BP 136/82 (BP Location: Left Arm, Patient Position: Sitting, Cuff Size: Normal)   Pulse 87   Temp 97.9 F (36.6 C) (Oral)   Ht 5' 3 (1.6 m)   Wt 144 lb (65.3 kg)   SpO2 94%   BMI 25.51 kg/m  General: Well developed, NAD, BMI noted Neck: No  thyromegaly  HEENT:  Normocephalic . Face symmetric, atraumatic Lungs:  CTA B Normal respiratory effort, no intercostal retractions, no accessory muscle use. Heart: RRR,  no murmur.  Abdomen:  Not distended, soft, non-tender. No rebound or rigidity.   Lower extremities: no pretibial edema bilaterally  Skin: Exposed areas without rash. Not pale. Not jaundice Neurologic:  alert & oriented X3.  Speech normal, gait appropriate for age and unassisted Strength symmetric and appropriate for age.  Psych: Cognition and judgment appear intact.  Cooperative with normal attention span and concentration.  Behavior appropriate. No anxious or depressed appearing.     Assessment     Assessment Hyper glycemia A1c 6.0 (02-2017) HTN Dyslipidemia Elevated LFTs: Mild, chronic -hep B&C (-)  - 04-2022:Electrophoresis, AMA, ferritin negative.  ANA mildly positive CV: Dr Wonda  CAD dx 2005, was asx >> stent  H/o false  Positive stress-EKG Myoview  2013 negative.  stress echo normal 11-2014 Normal echo stress test 08-2017 H/o abnormal PAP, cone bx (remotely) H/o Fx distal fibula -R- 2013, no surgery  PLAN Here for CPX -Td 2018 - pnm-23: 2015;  prevnar 03-2016; PNM 20: 2023  - zostavax 2011;  s/p shingrex - had a flu shot , covid  -Recommend  RSV  -Female care:   LOV  Dr Barbra 10-2015, was rec RTC PRN. MMG 05-2023 --CCS: normal cscope 2015. Cscope 10/04/2021, at [facility], next per GI -Labs: CMP FLP CBC A1c vitamin D  -diet  and  exercise: Doing well. -POA: Discussed Hyperglycemia: Has a healthy lifestyle, check A1c HTN: BP today is okay, recommend to check at home, continue losartan  High cholesterol, on Crestor , checking labs. L hip pain: Pain at the left hip area, at nighttime, able to do ADLs without problems, we agreed on Tylenol as needed, refer if symptoms increase Osteopenia: Previous bone density tests normal, T-score 05-2023 -1.7 (previous -0.6). In addition to calcium , vitamin D , we talk about pharmacological therapy.  We agreed to start Fosamax , how to taking and what to expect discussed with the patient. Aspirin: Correction, few months  ago I asked her to stop aspirin, she does have history of remote stent.  Recommend to restart aspirin. RTC 6 months

## 2023-09-03 NOTE — Patient Instructions (Addendum)
 Start taking Fosamax  1 tablet a week.  Take it on an empty stomach, early in the morning, do not lay down on eat for 30 to 40 minutes.  Restart aspirin 81 mg 1 tablet with breakfast  Tylenol  500 mg OTC 2 tabs a day every 8 hours as needed for pain   Check the  blood pressure regularly Blood pressure goal:  between 110/65 and  135/85. If it is consistently higher or lower, let me know     GO TO THE LAB : Get the blood work     Next visit with me in 6 months  Please schedule it at the front desk       Alendronate  Tablets (Fosamax ) What is this medication? ALENDRONATE  (a LEN droe nate) prevents and treats osteoporosis. It may also be used to treat Paget disease of the bone. It works by interior and spatial designer stronger and less likely to break (fracture). It belongs to a group of medications called bisphosphonates. This medicine may be used for other purposes; ask your health care provider or pharmacist if you have questions. COMMON BRAND NAME(S): Fosamax  What should I tell my care team before I take this medication? They need to know if you have any of these conditions: Bleeding disorder Cancer Dental disease Difficulty swallowing Infection (fever, chills, cough, sore throat, pain or trouble passing urine) Kidney disease Low levels of calcium  or other minerals in the blood Low red blood cell counts Receiving steroids like dexamethasone or prednisone Stomach or intestine problems Trouble sitting or standing for 30 minutes An unusual or allergic reaction to alendronate , other medications, foods, dyes or preservatives Pregnant or trying to get pregnant Breast-feeding How should I use this medication? Take this medication by mouth with a full glass of water. Take it as directed on the prescription label at the same time every day. Take the dose right after waking up. Do not eat or drink anything before taking it. Do not take it with any other drink except water. Do not chew or crush  the tablet. After taking it, do not eat breakfast, drink, or take any other medications or vitamins for at least 30 minutes. Sit or stand up for at least 30 minutes after you take it. Do not lie down. Keep taking it unless your care team tells you to stop. A special MedGuide will be given to you by the pharmacist with each prescription and refill. Be sure to read this information carefully each time. Talk to your care team about the use of this medication in children. Special care may be needed. Overdosage: If you think you have taken too much of this medicine contact a poison control center or emergency room at once. NOTE: This medicine is only for you. Do not share this medicine with others. What if I miss a dose? If you take your medication once a day, skip it. Take your next dose at the scheduled time the next morning. Do not take two doses on the same day. If you take your medication once a week, take the missed dose on the morning after you remember. Do not take two doses on the same day. What may interact with this medication? Aluminum hydroxide Antacids Aspirin Calcium  supplements Medications for inflammation like ibuprofen, naproxen , and others Iron supplements Magnesium supplements Vitamins with minerals This list may not describe all possible interactions. Give your health care provider a list of all the medicines, herbs, non-prescription drugs, or dietary supplements you use. Also tell them  if you smoke, drink alcohol, or use illegal drugs. Some items may interact with your medicine. What should I watch for while using this medication? Visit your care team for regular checks on your progress. It may be some time before you see the benefit from this medication. Some people who take this medication have severe bone, joint, or muscle pain. This medication may also increase your risk for jaw problems or a broken thigh bone. Tell your care team right away if you have severe pain in your  jaw, bones, joints, or muscles. Tell you care team if you have any pain that does not go away or that gets worse. Tell your dentist and dental surgeon that you are taking this medication. You should not have major dental surgery while on this medication. See your dentist to have a dental exam and fix any dental problems before starting this medication. Take good care of your teeth while on this medication. Make sure you see your dentist for regular follow-up appointments. You should make sure you get enough calcium  and vitamin D  while you are taking this medication. Discuss the foods you eat and the vitamins you take with your care team. You may need blood work done while you are taking this medication. What side effects may I notice from receiving this medication? Side effects that you should report to your care team as soon as possible: Allergic reactions--skin rash, itching, hives, swelling of the face, lips, tongue, or throat Low calcium  level--muscle pain or cramps, confusion, tingling, or numbness in the hands or feet Osteonecrosis of the jaw--pain, swelling, or redness in the mouth, numbness of the jaw, poor healing after dental work, unusual discharge from the mouth, visible bones in the mouth Pain or trouble swallowing Severe bone, joint, or muscle pain Stomach bleeding--bloody or black, tar-like stools, vomiting blood or brown material that looks like coffee grounds Side effects that usually do not require medical attention (report to your care team if they continue or are bothersome): Constipation Diarrhea Nausea Stomach pain This list may not describe all possible side effects. Call your doctor for medical advice about side effects. You may report side effects to FDA at 1-800-FDA-1088. Where should I keep my medication? Keep out of the reach of children and pets. Store at room temperature between 15 and 30 degrees C (59 and 86 degrees F). Throw away any unused medication after the  expiration date. NOTE: This sheet is a summary. It may not cover all possible information. If you have questions about this medicine, talk to your doctor, pharmacist, or health care provider.  2024 Elsevier/Gold Standard (2020-08-24 00:00:00)

## 2023-09-03 NOTE — Assessment & Plan Note (Signed)
 Here for CPX -Td 2018 - pnm-23: 2015;  prevnar 03-2016; PNM 20: 2023  - zostavax 2011;  s/p shingrex - had a flu shot , covid  -Recommend  RSV  -Female care:   LOV  Dr Barbra 10-2015, was rec RTC PRN. MMG 05-2023 --CCS: normal cscope 2015. Cscope 10/04/2021, at [facility], next per GI -Labs: CMP FLP CBC A1c vitamin D  -diet  and exercise: Doing well. -POA: Discussed

## 2023-09-03 NOTE — Assessment & Plan Note (Signed)
 Here for CPX  Hyperglycemia: Has a healthy lifestyle, check A1c HTN: BP today is okay, recommend to check at home, continue losartan  High cholesterol, on Crestor , checking labs. L hip pain: Pain at the left hip area, at nighttime, able to do ADLs without problems, we agreed on Tylenol as needed, refer if symptoms increase Osteopenia: Previous bone density tests normal, T-score 05-2023 -1.7 (previous -0.6). In addition to calcium , vitamin D , we talk about pharmacological therapy.  We agreed to start Fosamax , how to taking and what to expect discussed with the patient. Aspirin: Correction, few months ago I asked her to stop aspirin, she does have history of remote stent.  Recommend to restart aspirin. RTC 6 months

## 2023-09-04 LAB — CBC WITH DIFFERENTIAL/PLATELET
Basophils Absolute: 0 10*3/uL (ref 0.0–0.1)
Basophils Relative: 0.9 % (ref 0.0–3.0)
Eosinophils Absolute: 0.2 10*3/uL (ref 0.0–0.7)
Eosinophils Relative: 4.7 % (ref 0.0–5.0)
HCT: 41 % (ref 36.0–46.0)
Hemoglobin: 13.6 g/dL (ref 12.0–15.0)
Lymphocytes Relative: 35.8 % (ref 12.0–46.0)
Lymphs Abs: 1.6 10*3/uL (ref 0.7–4.0)
MCHC: 33.3 g/dL (ref 30.0–36.0)
MCV: 93.9 fL (ref 78.0–100.0)
Monocytes Absolute: 0.5 10*3/uL (ref 0.1–1.0)
Monocytes Relative: 11.7 % (ref 3.0–12.0)
Neutro Abs: 2.1 10*3/uL (ref 1.4–7.7)
Neutrophils Relative %: 46.9 % (ref 43.0–77.0)
Platelets: 168 10*3/uL (ref 150.0–400.0)
RBC: 4.37 Mil/uL (ref 3.87–5.11)
RDW: 13.5 % (ref 11.5–15.5)
WBC: 4.4 10*3/uL (ref 4.0–10.5)

## 2023-09-04 LAB — LIPID PANEL
Cholesterol: 151 mg/dL (ref 0–200)
HDL: 60.9 mg/dL (ref >39.00)
LDL Cholesterol: 62 mg/dL (ref 0–99)
NonHDL: 89.91
Total CHOL/HDL Ratio: 2
Triglycerides: 139 mg/dL (ref 0.0–149.0)
VLDL: 27.8 mg/dL (ref 0.0–40.0)

## 2023-09-04 LAB — COMPREHENSIVE METABOLIC PANEL
ALT: 22 U/L (ref 0–35)
AST: 36 U/L (ref 0–37)
Albumin: 4.6 g/dL (ref 3.5–5.2)
Alkaline Phosphatase: 75 U/L (ref 39–117)
BUN: 17 mg/dL (ref 6–23)
CO2: 29 meq/L (ref 19–32)
Calcium: 9.8 mg/dL (ref 8.4–10.5)
Chloride: 101 meq/L (ref 96–112)
Creatinine, Ser: 0.95 mg/dL (ref 0.40–1.20)
GFR: 58.25 mL/min — ABNORMAL LOW (ref >60.00)
Glucose, Bld: 98 mg/dL (ref 70–99)
Potassium: 4.2 meq/L (ref 3.5–5.1)
Sodium: 138 meq/L (ref 135–145)
Total Bilirubin: 1 mg/dL (ref 0.2–1.2)
Total Protein: 6.7 g/dL (ref 6.0–8.3)

## 2023-09-04 LAB — HEMOGLOBIN A1C: Hgb A1c MFr Bld: 6.1 % (ref 4.6–6.5)

## 2023-09-04 LAB — VITAMIN D 25 HYDROXY (VIT D DEFICIENCY, FRACTURES): VITD: 48.92 ng/mL (ref 30.00–100.00)

## 2023-09-11 ENCOUNTER — Other Ambulatory Visit: Payer: Self-pay | Admitting: Internal Medicine

## 2023-09-21 ENCOUNTER — Other Ambulatory Visit: Payer: Self-pay | Admitting: Internal Medicine

## 2023-12-25 ENCOUNTER — Ambulatory Visit: Payer: Self-pay

## 2023-12-25 NOTE — Telephone Encounter (Signed)
  Chief Complaint: Leg Pain Symptoms: right leg pain to the calf area from varicose veins- Frequency: started last Thursday Pertinent Negatives: Patient denies CP, SOB,  Disposition: [] ED /[] Urgent Care (no appt availability in office) / [x] Appointment(In office/virtual)/ []  [facility]/ [] Home Care/ [] Refused Recommended Disposition /[] [facility]/ []  Follow-up with PCP Additional Notes: patient calling with concerns for right leg pain that is centered around her varicose veins in that area. Patient endorse some swelling to the veins. Pain level of 2-3 out of 10 and patient states pain is at night. Per protocol, patient is recommended to be seen within three days. Appointment scheduled for tomorrow with PCP for 1:40 PM. Patient verbalized understanding of the plan and all questions answered.    Reason for Disposition  [1] MODERATE pain (e.g., interferes with normal activities, limping) AND [2] present > 3 days  Answer Assessment - Initial Assessment Questions 1. ONSET: "When did the pain start?"      Patient reports pain started last Thursday 2. LOCATION: "Where is the pain located?"      Right leg 3. PAIN: "How bad is the pain?"    (Scale 1-10; or mild, moderate, severe)   -  MILD (1-3): doesn't interfere with normal activities    -  MODERATE (4-7): interferes with normal activities (e.g., work or school) or awakens from sleep, limping    -  SEVERE (8-10): excruciating pain, unable to do any normal activities, unable to walk     2-3 out of 10 4. WORK OR EXERCISE: "Has there been any recent work or exercise that involved this part of the body?"      no 5. CAUSE: "What do you think is causing the leg pain?"     Varicose veins 6. OTHER SYMPTOMS: "Do you have any other symptoms?" (e.g., chest pain, back pain, breathing difficulty, swelling, rash, fever, numbness, weakness)     Swelling to leg  Protocols used: Leg Pain-A-AH

## 2023-12-25 NOTE — Telephone Encounter (Signed)
 Agent attempted warm transfer but Kara Wheeler had disconnected. 1st attempt to call Kara Wheeler back, no answer and left voicemail.  Copied from CRM [id number]. Topic: Clinical - Red Word Triage >> Dec 25, 2023 11:10 AM Kara Wheeler D wrote: Red Word that prompted transfer to Nurse Triage: Pain and Swelling  Kara Wheeler is experiencing soreness and pain in right leg. Kara Wheeler stated that her veins are also swollen. Kara Wheeler wants to schedule an office visit with provider.

## 2023-12-26 ENCOUNTER — Ambulatory Visit (INDEPENDENT_AMBULATORY_CARE_PROVIDER_SITE_OTHER): Admitting: Internal Medicine

## 2023-12-26 ENCOUNTER — Ambulatory Visit (HOSPITAL_BASED_OUTPATIENT_CLINIC_OR_DEPARTMENT_OTHER)
Admission: RE | Admit: 2023-12-26 | Discharge: 2023-12-26 | Disposition: A | Discharge status: Home / Self Care | Source: Ambulatory Visit | Attending: Internal Medicine | Admitting: Internal Medicine

## 2023-12-26 ENCOUNTER — Encounter: Payer: Self-pay | Admitting: Internal Medicine

## 2023-12-26 VITALS — BP 126/68 | HR 63 | Temp 97.8°F | Resp 16 | Ht 63.0 in | Wt 140.0 lb

## 2023-12-26 DIAGNOSIS — I809 Phlebitis and thrombophlebitis of unspecified site: Secondary | ICD-10-CM

## 2023-12-26 NOTE — Progress Notes (Unsigned)
 Subjective:    Patient ID: Kara Wheeler, female    DOB: 09-Jul-1946, 78 y.o.   MRN: [id number]  DOS:  12/26/2023 Type of visit - description: Acute  8 days ago noted some discomfort at the right pretibial area and when she looked down there to her surprise one of her veins was swelling. The area is minimally TTP, "it is more like a ache".  She is a frequent flyer, goes to [place]  often. No fever or chills No injury No pain in the calf No chest pain, shortness of breath or palpitation Review of Systems See above   Past Medical History:  Diagnosis Date   Ankle fracture, right 2013   Coronary atherosclerosis of native coronary artery 2005   Taxus drug-eluting stent to treat severe stenosis in the proximal LAD   HTN (hypertension)    Mixed dyslipidemia    Osteoarthritis     Past Surgical History:  Procedure Laterality Date   CERVICAL CONE BIOPSY  1978   benign   CORONARY STENT PLACEMENT  11/2003   LAD   TUBAL LIGATION Bilateral 1980    Current Outpatient Medications  Medication Instructions   alendronate  (FOSAMAX ) 70 mg, Oral, Every 7 days, Take with a full glass of water on an empty stomach.   aspirin EC 81 mg, Daily   Cholecalciferol (VITAMIN D -3 PO) 500 Units, Daily   losartan  (COZAAR ) 50 mg, Oral, Daily   Multiple Vitamin (MULTI VITAMIN) TABS 1 tablet, Daily   rosuvastatin  (CRESTOR ) 40 mg, Oral, Daily at bedtime       Objective:   Physical Exam BP 126/68   Pulse 63   Temp 97.8 F (36.6 C) (Oral)   Resp 16   Ht 5\' 3"  (1.6 m)   Wt 140 lb (63.5 kg)   SpO2 97%   BMI 24.80 kg/m  General:   Well developed, NAD, BMI noted. HEENT:  Normocephalic . Face symmetric, atraumatic   Lower extremities:  Calves measured: Symmetric, soft and nontender Varicose veins at the right leg noted, see picture.  The area is not red, slightly warm.  Not particularly TTP. Good pedal pulses.   Skin: Not pale. Not jaundice Neurologic:  alert & oriented X3.  Speech  normal, gait appropriate for age and unassisted Psych--  Cognition and judgment appear intact.  Cooperative with normal attention span and concentration.  Behavior appropriate. No anxious or depressed appearing.      Assessment     Assessment Hyper glycemia A1c 6.0 (02-2017) HTN Dyslipidemia Elevated LFTs: Mild, chronic -hep B&C (-)  - 04-2022:Electrophoresis, AMA, ferritin negative.  ANA mildly positive CV: Dr Arlester Ladd  CAD dx 2005, was asx >> stent  H/o false  Positive stress-EKG Myoview  2013 negative.  stress echo normal 11-2014 Normal echo stress test 08-2017 H/o abnormal PAP, cone bx (remotely) H/o Fx distal fibula -R- 2013, no surgery  PLAN Acute varicose vein, mild phlebitis: The patient noted a varicose vein in April 24, it is slightly uncomfortable and warm to touch. She flies or drives to [place]  frequently, last flight was from April 20 to the 30 thus at risk for clots Plan: Ultrasound rule out DVT.  If negative will recommend aspirin 3 times daily, ice.  See AVS.     1-8--- Here for CPX -Td 2018 - pnm-23: 2015;  prevnar 03-2016; PNM 20: 2023  - zostavax 2011;  s/p shingrex - had a flu shot , covid  -Recommend  RSV  -Female care:   LOV  Dr  Stinson 10-2015, was rec RTC PRN. MMG 05-2023 --CCS: normal cscope 2015. Cscope 10/04/2021, at [facility], next per GI -Labs: CMP FLP CBC A1c vitamin D  -diet  and exercise: Doing well. -POA: Discussed Hyperglycemia: Has a healthy lifestyle, check A1c HTN: BP today is okay, recommend to check at home, continue losartan  High cholesterol, on Crestor , checking labs. L hip pain: Pain at the left hip area, at nighttime, able to do ADLs without problems, we agreed on Tylenol as needed, refer if symptoms increase Osteopenia: Previous bone density tests normal, T-score 05-2023 -1.7 (previous -0.6). In addition to calcium , vitamin D , we talk about pharmacological therapy.  We agreed to start Fosamax , how to taking and what to expect  discussed with the patient. Aspirin: Correction, few months ago I asked her to stop aspirin, she does have history of remote stent.  Recommend to restart aspirin. RTC 6 months

## 2023-12-26 NOTE — Patient Instructions (Signed)
 Proceed with ultrasound on your right leg.  If there is no clot I would recommend the following: Increase aspirin to 3 times a day with meals for 2 weeks Icing the area If not better call me.  If there is a clot, I will contact you  Call anytime if you have leg swelling, chest pain or the area gets worse

## 2023-12-28 NOTE — Assessment & Plan Note (Signed)
 Varicose vein, mild phlebitis: The patient noted a varicose vein in April 24, it is slightly uncomfortable and warm to touch. She flies or drives to [place]  frequently, last flight was from April 20 to the 30 thus at risk for clots Plan: Ultrasound rule out DVT.  If negative will recommend aspirin 3 times daily, ice.  See AVS.

## 2024-02-17 ENCOUNTER — Encounter: Payer: Self-pay | Admitting: Pharmacist

## 2024-02-17 ENCOUNTER — Other Ambulatory Visit: Payer: Self-pay | Admitting: Pharmacist

## 2024-02-17 MED ORDER — LOSARTAN POTASSIUM 50 MG PO TABS: 50.0000 mg | ORAL_TABLET | Freq: Every day | ORAL | 0 refills | Status: DC

## 2024-02-17 NOTE — Progress Notes (Signed)
 Pharmacy Quality Measure Review  This patient is appearing on a report for being at risk of failing the adherence measure for hypertension (ACEi/ARB) medications this calendar year.   Medication: losartan  Last fill date: 09/22/2023 for 90 day supply per insurance report.   It looks like she filled 14 days supply in [place]  on 11/02/2023 - suspect this was a vacation supply but Rx was transferred from her usual pharmacy.   BP Readings from Last 3 Encounters:  12/26/23 126/68  09/03/23 136/82  11/12/22 126/74    Left voicemail for patient to return my call at their convenience. Will send in updated Rx to her pharmacy in [facility].   Madelin Ray, PharmD Clinical Pharmacist [facility]  Population Health [phone]

## 2024-03-02 ENCOUNTER — Encounter: Payer: Self-pay | Admitting: Internal Medicine

## 2024-03-02 ENCOUNTER — Ambulatory Visit (INDEPENDENT_AMBULATORY_CARE_PROVIDER_SITE_OTHER): Payer: Medicare HMO | Admitting: Internal Medicine

## 2024-03-02 ENCOUNTER — Ambulatory Visit (HOSPITAL_BASED_OUTPATIENT_CLINIC_OR_DEPARTMENT_OTHER)
Admission: RE | Admit: 2024-03-02 | Discharge: 2024-03-02 | Disposition: A | Discharge status: Home / Self Care | Source: Ambulatory Visit | Attending: Internal Medicine | Admitting: Internal Medicine

## 2024-03-02 VITALS — BP 124/82 | HR 68 | Resp 16 | Ht 63.0 in | Wt 139.6 lb

## 2024-03-02 DIAGNOSIS — R739 Hyperglycemia, unspecified: Secondary | ICD-10-CM | POA: Diagnosis not present

## 2024-03-02 DIAGNOSIS — I10 Essential (primary) hypertension: Secondary | ICD-10-CM

## 2024-03-02 DIAGNOSIS — M419 Scoliosis, unspecified: Secondary | ICD-10-CM | POA: Diagnosis not present

## 2024-03-02 DIAGNOSIS — M545 Low back pain, unspecified: Secondary | ICD-10-CM | POA: Insufficient documentation

## 2024-03-02 DIAGNOSIS — M533 Sacrococcygeal disorders, not elsewhere classified: Secondary | ICD-10-CM | POA: Diagnosis not present

## 2024-03-02 DIAGNOSIS — M51369 Other intervertebral disc degeneration, lumbar region without mention of lumbar back pain or lower extremity pain: Secondary | ICD-10-CM | POA: Diagnosis not present

## 2024-03-02 DIAGNOSIS — M4316 Spondylolisthesis, lumbar region: Secondary | ICD-10-CM | POA: Diagnosis not present

## 2024-03-02 NOTE — Patient Instructions (Signed)
 Continue with Tylenol as needed for pain.  Okay to take occasional ibuprofen  Will refer you to our sports medicine doctor  Check the  blood pressure regularly Blood pressure goal:  between 110/65 and  135/85. If it is consistently higher or lower, let me know     GO TO THE LAB :  Get the blood work   Your results will be posted on MyChart with my comments  Next office visit for a physical exam in 6 months, sooner if needed Please make an appointment before you leave today    STOP BY THE FIRST FLOOR:  get the XR

## 2024-03-02 NOTE — Progress Notes (Unsigned)
   Subjective:    Patient ID: Kara Wheeler, female    DOB: [birth year], 78 y.o.   MRN: [id number]  DOS:  03/02/2024 Type of visit - description: Follow-up  Chronic medical problems addressed Good med compliance.  Also no history of left-sided back pain. She is able to take walks, do yoga without problems but when she lays down that is when the pain starts. No radiation, no paresthesias.    Review of Systems See above   Past Medical History:  Diagnosis Date   Ankle fracture, right 2013   Coronary atherosclerosis of native coronary artery 2005   Taxus drug-eluting stent to treat severe stenosis in the proximal LAD   HTN (hypertension)    Mixed dyslipidemia    Osteoarthritis     Past Surgical History:  Procedure Laterality Date   CERVICAL CONE BIOPSY  1978   benign   CORONARY STENT PLACEMENT  11/2003   LAD   TUBAL LIGATION Bilateral 1980    Current Outpatient Medications  Medication Instructions   alendronate  (FOSAMAX ) 70 mg, Oral, Every 7 days, Take with a full glass of water on an empty stomach.   aspirin EC 81 mg, Daily   Cholecalciferol (VITAMIN D -3 PO) 500 Units, Daily   losartan  (COZAAR ) 50 mg, Oral, Daily   Multiple Vitamin (MULTI VITAMIN) TABS 1 tablet, Daily   rosuvastatin  (CRESTOR ) 40 mg, Oral, Daily at bedtime       Objective:   Physical Exam BP 124/82   Pulse 68   Resp 16   Ht 5' 3 (1.6 m)   Wt 139 lb 9.6 oz (63.3 kg)   SpO2 98%   BMI 24.73 kg/m  General:   Well developed, NAD, BMI noted. HEENT:  Normocephalic . Face symmetric, atraumatic Lungs:  CTA B Normal respiratory effort, no intercostal retractions, no accessory muscle use. Heart: RRR,  no murmur.  Lower extremities: no pretibial edema bilaterally MSK: Slightly TTP at the left SI.  No TTP of the lumbar spine. Skin: Not pale. Not jaundice Neurologic:  alert & oriented X3.  Speech normal, gait appropriate for age and unassisted.  Motor DTR symmetric.  Straight leg test  negative Psych--  Cognition and judgment appear intact.  Cooperative with normal attention span and concentration.  Behavior appropriate. No anxious or depressed appearing.      Assessment    Assessment Hyper glycemia A1c 6.0 (02-2017) HTN Dyslipidemia Elevated LFTs: Mild, chronic -hep B&C (-)  - 04-2022:Electrophoresis, AMA, ferritin negative.  ANA mildly positive CV: Dr Wonda  CAD dx 2005, was asx >> stent  H/o false  Positive stress-EKG Myoview  2013 negative.  stress echo normal 11-2014 Normal echo stress test 08-2017 Osteopenia: Started Fosamax  08/2023. H/o abnormal PAP, cone bx (remotely) H/o Fx distal fibula -R- 2013, no surgery  PLAN Hyperglycemia: Check A1c HTN: BP very good, on losartan , check BMP. Left lumbalgia:  earlier this year she complained of L hip pain, at this point the pain has migrated to the left back.  She is slightly TTP at the sacroiliac area. Last seen by Ortho by some similar pain years ago. Plan: X-ray, sports medicine referral, sacroiliac dysfunction? Osteoporosis: Tolerating Fosamax  well  Varicose veins: See LOV, US  negative for DVT, symptoms resolved. RTC 6 months.

## 2024-03-03 LAB — BASIC METABOLIC PANEL WITH GFR
BUN: 21 mg/dL (ref 6–23)
CO2: 32 meq/L (ref 19–32)
Calcium: 10.1 mg/dL (ref 8.4–10.5)
Chloride: 101 meq/L (ref 96–112)
Creatinine, Ser: 1 mg/dL (ref 0.40–1.20)
GFR: 54.58 mL/min — ABNORMAL LOW (ref >60.00)
Glucose, Bld: 90 mg/dL (ref 70–99)
Potassium: 4.5 meq/L (ref 3.5–5.1)
Sodium: 138 meq/L (ref 135–145)

## 2024-03-03 LAB — HEMOGLOBIN A1C: Hgb A1c MFr Bld: 6.2 % (ref 4.6–6.5)

## 2024-03-03 NOTE — Assessment & Plan Note (Signed)
 Hyperglycemia: Check A1c HTN: BP very good, on losartan , check BMP. Left lumbalgia:  earlier this year she complained of L hip pain, at this point the pain has migrated to the left back.  She is slightly TTP at the sacroiliac area. Last seen by Ortho by some similar pain years ago. Plan: X-ray, sports medicine referral, sacroiliac dysfunction? Osteoporosis: Tolerating Fosamax  well Varicose veins: See LOV, US  negative for DVT, symptoms resolved. RTC 6 months.

## 2024-03-04 ENCOUNTER — Ambulatory Visit: Payer: Self-pay | Admitting: Internal Medicine

## 2024-03-04 ENCOUNTER — Ambulatory Visit: Admitting: Sports Medicine

## 2024-03-04 ENCOUNTER — Encounter: Payer: Self-pay | Admitting: Sports Medicine

## 2024-03-04 VITALS — BP 110/62 | Ht 63.0 in | Wt 139.0 lb

## 2024-03-04 DIAGNOSIS — M5136 Other intervertebral disc degeneration, lumbar region with discogenic back pain only: Secondary | ICD-10-CM

## 2024-03-04 NOTE — Progress Notes (Signed)
   Subjective:    Patient ID: Kara Wheeler, female    DOB: [birth year], 78 y.o.   MRN: [id number]  HPI chief complaint: Low back pain  Kara Wheeler is a very pleasant 78 year old female that presents today with chronic left-sided low back pain.  Her pain is noticeable primarily at night.  She does get some relief with Tylenol and over-the-counter Advil.  She has no pain during the day.  Some pain with first awakening but this resolves as she becomes more active.  No numbness or tingling in her legs.  Pain does not radiate.  X-rays of the lumbar spine done yesterday show a grade 2 anterior listhesis of L4 on L5 with collapsed disc at this level.  No prior low back surgeries.  Past medical history reviewed Medications reviewed Allergies reviewed  Review of Systems As above    Objective:   Physical Exam  Well-developed, well-nourished.  No acute distress  Lumbar spine: Excellent painless lumbar range of motion.  There is no tenderness to palpation along the lumbar midline.  No spasm.  Negative straight leg raise bilaterally.  No gross neurological deficit of either lower extremity.  Lumbar spine x-rays are as above      Assessment & Plan:   Low back pain secondary to degenerative L4 on L5 anterior listhesis and associated degenerative disc disease  Overall, patient's symptoms are quite tolerable.  She does not feel like her pain is significant enough to warrant any prescription medication.  She may want to try some topical Voltaren gel.  I encouraged her to stay active and she will follow-up with me as needed.  This note was dictated using Dragon naturally speaking software and may contain errors in syntax, spelling, or content which have not been identified prior to signing this note.

## 2024-03-04 NOTE — Patient Instructions (Addendum)
 Hi Peg  The slippage in your low back that we discussed is actually at the L4-L5 level and not L5-S1 as I had originally thought.  The technical term is spondylolisthesis.  Dr Arvell

## 2024-03-06 ENCOUNTER — Other Ambulatory Visit: Payer: Self-pay | Admitting: Internal Medicine

## 2024-05-10 ENCOUNTER — Ambulatory Visit (INDEPENDENT_AMBULATORY_CARE_PROVIDER_SITE_OTHER): Admitting: *Deleted

## 2024-05-10 VITALS — Ht 63.0 in | Wt 139.0 lb

## 2024-05-10 DIAGNOSIS — Z0000 Encounter for general adult medical examination without abnormal findings: Secondary | ICD-10-CM | POA: Diagnosis not present

## 2024-05-10 DIAGNOSIS — Z1231 Encounter for screening mammogram for malignant neoplasm of breast: Secondary | ICD-10-CM

## 2024-05-10 NOTE — Patient Instructions (Addendum)
 Ms. Ridings , Thank you for taking time out of your busy schedule to complete your Annual Wellness Visit with me. I enjoyed our conversation and look forward to speaking with you again next year. I, as well as your care team,  appreciate your ongoing commitment to your health goals. Please review the following plan we discussed and let me know if I can assist you in the future. Your Game plan/ To Do List    Referrals: If you haven't heard from the office you've been referred to, please reach out to them at the phone provided.   Mammogram after 06/15/24:  [phone]  Follow up Visits: Next Medicare AWV with our clinical staff:  05/12/25 1:40pm, telephone  Next Office Visit with your provider: 09/06/24 1pm, Dr Amon  Clinician Recommendations:  Aim for 30 minutes of exercise or brisk walking, 6-8 glasses of water, and 5 servings of fruits and vegetables each day.    You will need to get the following vaccines at your local pharmacy:  Flu    This is a list of the screening recommended for you and due dates:  Health Maintenance  Topic Date Due   Flu Shot  03/26/2024   COVID-19 Vaccine (6 - 2025-26 season) 04/26/2024   Medicare Annual Wellness Visit  05/06/2024   Colon Cancer Screening  10/04/2028   DTaP/Tdap/Td vaccine (4 - Td or Tdap) 06/02/2033   Pneumococcal Vaccine for age over 52  Completed   DEXA scan (bone density measurement)  Completed   Hepatitis C Screening  Completed   Zoster (Shingles) Vaccine  Completed   HPV Vaccine  Aged Out   Meningitis B Vaccine  Aged Out   Breast Cancer Screening  Discontinued    Advanced directives: (Copy Requested) Please bring a copy of your health care power of attorney and living will to the office to be added to your chart at your convenience. You can mail to [facility] [address] [place], [place] [address] or email to [email or web address] Advance Care Planning is important because it:  [x]  Makes sure you receive  the medical care that is consistent with your values, goals, and preferences  [x]  It provides guidance to your family and loved ones and reduces their decisional burden about whether or not they are making the right decisions based on your wishes.  Follow the link provided in your after visit summary or read over the paperwork we have mailed to you to help you started getting your Advance Directives in place. If you need assistance in completing these, please reach out to us  so that we can help you!  See attachments for Preventive Care and Fall Prevention Tips.

## 2024-05-10 NOTE — Progress Notes (Signed)
 Subjective:   Kara Wheeler is a 78 y.o. who presents for a Medicare Wellness preventive visit.  As a reminder, Annual Wellness Visits don't include a physical exam, and some assessments may be limited, especially if this visit is performed virtually. We may recommend an in-person follow-up visit with your provider if needed.  Visit Complete: Virtual I connected with  Kara Wheeler on 05/10/24 by a audio enabled telemedicine application and verified that I am speaking with the correct person using two identifiers.  Patient Location: Home  Provider Location: Office/Clinic  I discussed the limitations of evaluation and management by telemedicine. The patient expressed understanding and agreed to proceed.  Vital Signs: Because this visit was a virtual/telehealth visit, some criteria may be missing or patient reported. Any vitals not documented were not able to be obtained and vitals that have been documented are patient reported.  VideoDeclined- This patient declined Librarian, academic. Therefore the visit was completed with audio only.  Persons Participating in Visit: Patient.  AWV Questionnaire: Yes: Patient Medicare AWV questionnaire was completed by the patient on 05/03/24; I have confirmed that all information answered by patient is correct and no changes since this date.  Cardiac Risk Factors include: hypertension;advanced age (>79men, >49 women);dyslipidemia;Other (see comment), Risk factor comments: coronary atherosclerosis     Objective:    Today's Vitals   05/10/24 1303  Weight: 139 lb (63 kg)  Height: 5' 3 (1.6 m)   Body mass index is 24.62 kg/m.     05/10/2024    1:25 PM 05/07/2023    1:03 PM 05/01/2022    2:59 PM 04/04/2021    2:19 PM 07/11/2020   10:20 AM 03/07/2020    8:52 AM 02/17/2018    1:15 PM  Advanced Directives  Does Patient Have a Medical Advance Directive? No No No No Yes Yes No   Type of Careers adviser;Living will Healthcare Power of [place];Living will   Does patient want to make changes to medical advance directive?      No - Patient declined   Copy of Healthcare Power of Attorney in Chart?     No - copy requested No - copy requested   Would patient like information on creating a medical advance directive? No - Patient declined No - Patient declined No - Patient declined No - Patient declined   Yes (MAU/Ambulatory/Procedural Areas - Information given)      Data saved with a previous flowsheet row definition    Current Medications (verified) Outpatient Encounter Medications as of 05/10/2024  Medication Sig   alendronate  (FOSAMAX ) 70 MG tablet Take 1 tablet (70 mg total) by mouth every 7 (seven) days. Take with a full glass of water on an empty stomach.   aspirin EC 81 MG tablet Take 81 mg by mouth daily. Swallow whole.   Cholecalciferol (VITAMIN D -3 PO) Take 500 Units by mouth daily.    losartan  (COZAAR ) 50 MG tablet Take 1 tablet (50 mg total) by mouth daily.   Multiple Vitamin (MULTI VITAMIN) TABS Take 1 tablet by mouth daily.   rosuvastatin  (CRESTOR ) 40 MG tablet Take 1 tablet (40 mg total) by mouth at bedtime.   No facility-administered encounter medications on file as of 05/10/2024.    Allergies (verified) Patient has no known allergies.   History: Past Medical History:  Diagnosis Date   Ankle fracture, right 2013   Coronary atherosclerosis of native coronary artery 2005  Taxus drug-eluting stent to treat severe stenosis in the proximal LAD   HTN (hypertension)    Mixed dyslipidemia    Osteoarthritis    Past Surgical History:  Procedure Laterality Date   CERVICAL CONE BIOPSY  1978   benign   CORONARY STENT PLACEMENT  11/2003   LAD   TUBAL LIGATION Bilateral 1980   Family History  Problem Relation Age of Onset   Lymphoma Mother        Lymphoma   Cancer Mother    Early death Mother    Heart attack Father 46       second at age 77    Early death Father    Heart disease Father    Heart disease Brother    Colon cancer Brother 79   Atrial fibrillation Brother    Heart attack Paternal Grandmother    Stroke Paternal Grandmother    Heart attack Paternal Grandfather    Stroke Paternal Grandfather    Breast cancer Neg Hx    Diabetes Neg Hx    Social History   Socioeconomic History   Marital status: Married    Spouse name: Not on file   Number of children: 1   Years of education: Not on file   Highest education level: Some college, no degree  Occupational History   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, [employer]  Tobacco Use   Smoking status: Former    Current packs/day: 0.00    Average packs/day: 0.3 packs/day for 4.0 years (1.0 ttl pk-yrs)    Types: Cigarettes    Start date: 08/26/1962    Quit date: 08/26/1966    Years since quitting: 57.7   Smokeless tobacco: Never  Substance and Sexual Activity   Alcohol use: Yes    Alcohol/week: 2.0 - 3.0 standard drinks of alcohol    Types: 2 - 3 Glasses of wine per week    Comment: wine socially   Drug use: No    Comment: marijuana -- very little yrs ago   Sexual activity: Yes    Partners: Male    Birth control/protection: Surgical, Post-menopausal    Comment: tubal  Other Topics Concern   Not on file  Social History Narrative   Household-- pt and husband (Mr Terry)   Social Drivers of Health   Financial Resource Strain: Low Risk  (05/10/2024)   Overall Financial Resource Strain (CARDIA)    Difficulty of Paying Living Expenses: Not hard at all  Food Insecurity: No Food Insecurity (05/10/2024)   Hunger Vital Sign    Worried About Running Out of Food in the Last Year: Never true    Ran Out of Food in the Last Year: Never true  Transportation Needs: No Transportation Needs (05/10/2024)   PRAPARE - Administrator, Civil Service (Medical): No    Lack of Transportation (Non-Medical): No  Physical Activity: Sufficiently Active (05/10/2024)   Exercise  Vital Sign    Days of Exercise per Week: 5 days    Minutes of Exercise per Session: 50 min  Stress: Stress Concern Present (05/10/2024)   [employer] of Occupational Health - Occupational Stress Questionnaire    Feeling of Stress: Rather much  Social Connections: Moderately Integrated (05/10/2024)   Social Connection and Isolation Panel    Frequency of Communication with Friends and Family: Once a week    Frequency of Social Gatherings with Friends and Family: Twice a week    Attends Religious Services: Never    Active Member  of Clubs or Organizations: Yes    Attends Banker Meetings: 1 to 4 times per year    Marital Status: Married  Recent Concern: Social Connections - Moderately Isolated (03/01/2024)   Social Connection and Isolation Panel    Frequency of Communication with Friends and Family: Once a week    Frequency of Social Gatherings with Friends and Family: Twice a week    Attends Religious Services: Patient declined    Database administrator or Organizations: No    Attends Engineer, structural: Not on file    Marital Status: Married    Tobacco Counseling Counseling given: Not Answered    Clinical Intake:  Pre-visit preparation completed: Yes        BMI - recorded: 24.62 Nutritional Status: BMI 25 -29 Overweight Nutritional Risks: None Diabetes: No  Lab Results  Component Value Date   HGBA1C 6.2 03/02/2024   HGBA1C 6.1 09/03/2023   HGBA1C 6.0 05/14/2022     How often do you need to have someone help you when you read instructions, pamphlets, or other written materials from your doctor or pharmacy?: 1 - Never What is the last grade level you completed in school?: some college  Interpreter Needed?: No  Information entered by :: Lolita Libra, CMA(AAMA)   Activities of Daily Living     05/03/2024    7:46 PM 12/26/2023    1:42 PM  In your present state of health, do you have any difficulty performing the following activities:   Hearing? 0 0  Vision? 0 0  Difficulty concentrating or making decisions? 0 0  Walking or climbing stairs? 0 0  Dressing or bathing? 0 0  Doing errands, shopping? 0 0  Preparing Food and eating ? N   Using the Toilet? N   In the past six months, have you accidently leaked urine? Y   Comment has to go as soon as she gets the urge. wears liner.   Do you have problems with loss of bowel control? N   Managing your Medications? N   Managing your Finances? N   Housekeeping or managing your Housekeeping? N     Patient Care Team: Amon Aloysius BRAVO, MD as PCP - General (Internal Medicine) Wonda Sharper, MD as PCP - Cardiology (Cardiology) Rilla Ade, MD as Consulting Physician (Internal Medicine) Barbra Lang PARAS, DO as Consulting Physician (Gynecology) Alm Makos, OD as Consulting Physician (Optometry) Donnald Charleston, MD as Consulting Physician (Gastroenterology) [facility] (Optometry)  I have updated your Care Teams any recent Medical Services you may have received from other providers in the past year.     Assessment:   This is a routine wellness examination for [facility]  Hearing/Vision screen Hearing Screening - Comments:: Has some difficulty, hasn't made appt for testing yet. Vision Screening - Comments:: Up to date with routine eye exams with [facility]    Goals Addressed   None    Depression Screen     05/10/2024    1:20 PM 12/26/2023    1:39 PM 09/03/2023    2:43 PM 05/07/2023    1:06 PM 11/12/2022    1:02 PM 05/14/2022    2:54 PM 05/01/2022    3:02 PM  PHQ 2/9 Scores  PHQ - 2 Score 0 0 0 0 0 0 0  PHQ- 9 Score 3          Fall Risk     05/03/2024    7:46 PM 12/26/2023  1:39 PM 09/03/2023    2:43 PM 05/05/2023   12:24 PM 11/12/2022    1:01 PM  Fall Risk   Falls in the past year? 1 0 0 0 0  Comment while hiking.      Number falls in past yr: 0 0 0 0 0  Injury with Fall? 0 0 0 0 0  Risk for fall due to : No Fall Risks  No Fall Risks No Fall Risks    Follow up Education provided Falls evaluation completed;Education provided Falls evaluation completed Falls evaluation completed Falls evaluation completed    MEDICARE RISK AT HOME:  Medicare Risk at Home Any stairs in or around the home?: (Patient-Rptd) No If so, are there any without handrails?: (Patient-Rptd) No Home free of loose throw rugs in walkways, pet beds, electrical cords, etc?: (Patient-Rptd) Yes Adequate lighting in your home to reduce risk of falls?: (Patient-Rptd) Yes Life alert?: (Patient-Rptd) No Use of a cane, walker or w/c?: (Patient-Rptd) No Grab bars in the bathroom?: (Patient-Rptd) Yes Shower chair or bench in shower?: (Patient-Rptd) No Elevated toilet seat or a handicapped toilet?: (Patient-Rptd) Yes  TIMED UP AND GO:  Was the test performed?  No,audio  Cognitive Function: 6CIT completed    01/28/2017    9:00 AM  MMSE - Mini Mental State Exam  Orientation to time 5   Orientation to Place 5   Registration 3   Attention/ Calculation 5   Recall 3   Language- name 2 objects 2   Language- repeat 1  Language- follow 3 step command 3   Language- read & follow direction 1   Write a sentence 1   Copy design 1   Total score 30      Data saved with a previous flowsheet row definition        05/10/2024    1:25 PM 05/07/2023    1:09 PM 05/01/2022    3:14 PM  6CIT Screen  What Year? 0 points 0 points 0 points  What month? 0 points 0 points 0 points  What time? 0 points 0 points 0 points  Count back from 20 0 points 0 points 0 points  Months in reverse 0 points 0 points 0 points  Repeat phrase 0 points 0 points 0 points  Total Score 0 points 0 points 0 points    Immunizations Immunization History  Administered Date(s) Administered   Fluad Quad(high Dose 65+) 06/08/2022   INFLUENZA, HIGH DOSE SEASONAL PF 05/27/2019, 06/06/2021, 06/03/2023   Influenza Split 05/15/2018   Influenza-Unspecified 05/26/2015, 05/26/2016, 06/04/2017, 05/25/2020   Moderna  Covid-19 Vaccine Bivalent Booster 49yrs & up 06/20/2021   Moderna Sars-Covid-2 Vaccination 09/26/2019, 10/25/2019, 06/27/2020, 12/12/2020   PNEUMOCOCCAL CONJUGATE-20 05/14/2022   Pneumococcal Conjugate-13 04/02/2016   Pneumococcal Polysaccharide-23 08/17/2014   Respiratory Syncytial Virus Vaccine,Recomb Aduvanted(Arexvy) 09/23/2022   Td 03/05/2017   Tdap 04/21/2007, 06/03/2023   Zoster Recombinant(Shingrix ) 03/18/2019, 07/17/2019   Zoster, Live 08/26/2009    Screening Tests Health Maintenance  Topic Date Due   Influenza Vaccine  03/26/2024   COVID-19 Vaccine (6 - 2025-26 season) 04/26/2024   Medicare Annual Wellness (AWV)  05/06/2024   Colonoscopy  10/04/2028   DTaP/Tdap/Td (4 - Td or Tdap) 06/02/2033   Pneumococcal Vaccine: 50+ Years  Completed   DEXA SCAN  Completed   Hepatitis C Screening  Completed   Zoster Vaccines- Shingrix   Completed   HPV VACCINES  Aged Out   Meningococcal B Vaccine  Aged Out   Mammogram  Discontinued  Health Maintenance Items Addressed: Mammogram ordered, will get flu vaccine at pharmacy  Additional Screening:  Vision Screening: Recommended annual ophthalmology exams for early detection of glaucoma and other disorders of the eye. Is the patient up to date with their annual eye exam?  Yes  Who is the provider or what is the name of the office in which the patient attends annual eye exams? [facility]  Dental Screening: Recommended annual dental exams for proper oral hygiene  Community Resource Referral / Chronic Care Management: CRR required this visit?  No   CCM required this visit?  No   Plan:    I have personally reviewed and noted the following in the patient's chart:   Medical and social history Use of alcohol, tobacco or illicit drugs  Current medications and supplements including opioid prescriptions. Patient is not currently taking opioid prescriptions. Functional ability and status Nutritional status Physical  activity Advanced directives List of other physicians Hospitalizations, surgeries, and ER visits in previous 12 months Vitals Screenings to include cognitive, depression, and falls Referrals and appointments  In addition, I have reviewed and discussed with patient certain preventive protocols, quality metrics, and best practice recommendations. A written personalized care plan for preventive services as well as general preventive health recommendations were provided to patient.   Lolita Libra, CMA   05/10/2024   After Visit Summary: (MyChart) Due to this being a telephonic visit, the after visit summary with patients personalized plan was offered to patient via MyChart   Notes: Nothing significant to report at this time.

## 2024-05-21 ENCOUNTER — Other Ambulatory Visit: Payer: Self-pay | Admitting: Internal Medicine

## 2024-06-28 ENCOUNTER — Encounter (HOSPITAL_BASED_OUTPATIENT_CLINIC_OR_DEPARTMENT_OTHER): Payer: Self-pay

## 2024-06-28 ENCOUNTER — Ambulatory Visit (HOSPITAL_BASED_OUTPATIENT_CLINIC_OR_DEPARTMENT_OTHER)
Admission: RE | Admit: 2024-06-28 | Discharge: 2024-06-28 | Disposition: A | Discharge status: Home / Self Care | Source: Ambulatory Visit | Attending: Internal Medicine | Admitting: Internal Medicine

## 2024-06-28 DIAGNOSIS — Z1231 Encounter for screening mammogram for malignant neoplasm of breast: Secondary | ICD-10-CM | POA: Insufficient documentation

## 2024-07-29 ENCOUNTER — Other Ambulatory Visit: Payer: Self-pay | Admitting: Internal Medicine

## 2024-08-23 ENCOUNTER — Telehealth: Payer: Self-pay | Admitting: Pharmacist

## 2024-08-23 NOTE — Progress Notes (Signed)
 Pharmacy Quality Measure Review  This patient is appearing on a report for being at risk of failing the adherence measure for hypertension (ACEi/ARB) medications this calendar year.   Medication: losartan   Last fill date: 05/21/2024 for 90 day supply per adherence report.   Reviewed recent refill history in Dr Annemarie database. Actual last refill date was 08/17/2024 for 90 day supply. Patient has no refills remaining. Next appointment with PCP is 09/06/2024.    Insurance report was not up to date. No action needed at this time.   Madelin Ray, PharmD Clinical Pharmacist [facility]

## 2024-08-25 ENCOUNTER — Ambulatory Visit: Payer: Self-pay

## 2024-08-25 ENCOUNTER — Ambulatory Visit (INDEPENDENT_AMBULATORY_CARE_PROVIDER_SITE_OTHER): Admitting: Medical

## 2024-08-25 VITALS — BP 128/80 | HR 79 | Temp 98.1°F | Resp 15 | Ht 63.0 in | Wt 144.0 lb

## 2024-08-25 DIAGNOSIS — H6122 Impacted cerumen, left ear: Secondary | ICD-10-CM

## 2024-08-25 DIAGNOSIS — L739 Follicular disorder, unspecified: Secondary | ICD-10-CM

## 2024-08-25 DIAGNOSIS — R1030 Lower abdominal pain, unspecified: Secondary | ICD-10-CM | POA: Diagnosis not present

## 2024-08-25 DIAGNOSIS — R195 Other fecal abnormalities: Secondary | ICD-10-CM | POA: Diagnosis not present

## 2024-08-25 LAB — COMPREHENSIVE METABOLIC PANEL WITH GFR
AG Ratio: 1.9 (calc) (ref 1.0–2.5)
ALT: 25 U/L (ref 6–29)
AST: 33 U/L (ref 10–35)
Albumin: 4.5 g/dL (ref 3.6–5.1)
Alkaline phosphatase (APISO): 56 U/L (ref 37–153)
BUN: 17 mg/dL (ref 7–25)
CO2: 31 mmol/L (ref 20–32)
Calcium: 10 mg/dL (ref 8.6–10.4)
Chloride: 102 mmol/L (ref 98–110)
Creat: 0.88 mg/dL (ref 0.60–1.00)
Globulin: 2.4 g/dL (ref 1.9–3.7)
Glucose, Bld: 74 mg/dL (ref 65–99)
Potassium: 4.2 mmol/L (ref 3.5–5.3)
Sodium: 139 mmol/L (ref 135–146)
Total Bilirubin: 1.1 mg/dL (ref 0.2–1.2)
Total Protein: 6.9 g/dL (ref 6.1–8.1)
eGFR: 68 mL/min/1.73m2

## 2024-08-25 LAB — CBC WITH DIFFERENTIAL/PLATELET
Absolute Lymphocytes: 1463 {cells}/uL (ref 850–3900)
Absolute Monocytes: 638 {cells}/uL (ref 200–950)
Basophils Absolute: 22 {cells}/uL (ref 0–200)
Basophils Relative: 0.4 %
Eosinophils Absolute: 182 {cells}/uL (ref 15–500)
Eosinophils Relative: 3.3 %
HCT: 41.5 % (ref 35.9–46.0)
Hemoglobin: 13.6 g/dL (ref 11.7–15.5)
MCH: 30.6 pg (ref 27.0–33.0)
MCHC: 32.8 g/dL (ref 31.6–35.4)
MCV: 93.5 fL (ref 81.4–101.7)
MPV: 9.5 fL (ref 7.5–12.5)
Monocytes Relative: 11.6 %
Neutro Abs: 3196 {cells}/uL (ref 1500–7800)
Neutrophils Relative %: 58.1 %
Platelets: 210 Thousand/uL (ref 140–400)
RBC: 4.44 Million/uL (ref 3.80–5.10)
RDW: 12.7 % (ref 11.0–15.0)
Total Lymphocyte: 26.6 %
WBC: 5.5 Thousand/uL (ref 3.8–10.8)

## 2024-08-25 NOTE — Progress Notes (Signed)
 "  Subjective:    Patient ID: Kara Wheeler, female    DOB: 1946-02-06, 78 y.o.   MRN: [id number]  HPI  Kara Wheeler Peg is a 78 year old female who presents with abdominal pain and looser/softer stools.  She has had mild abdominal pain below the umbilicus with bloating for the past couple of days. She has more frequent very loose stools, which began before the pain but were not initially painful. She denies fever, chills, sweats, body aches, and urinary symptoms such as dysuria or frequency.  Pt notes some correlation of abdomen discomfort starting around time augmentin started.   She completed a five-day course of Augmentin for an ear infection, finishing two days ago. Her ear no longer hurts.   She notes a tender bump on her left side scalp that hurts only when touched past day or two.  She has no urinary symptoms but feels an urge to urinate when pressure is applied to her abdomen without increased frequency.      Review of Systems  Constitutional:  Negative for chills and fatigue.  HENT:  Negative for congestion, ear pain, mouth sores, rhinorrhea, sinus pressure and sinus pain.   Respiratory:  Negative for cough, chest tightness and wheezing.   Cardiovascular:  Negative for chest pain and palpitations.  Gastrointestinal:  Positive for abdominal distention and abdominal pain. Negative for constipation and nausea.       Looser stools but formed. Semi solid formed. Pt showed me picture.  Genitourinary:  Negative for dysuria, frequency, urgency and vaginal discharge.  Musculoskeletal:  Negative for back pain and myalgias.  Skin:        Left side scalp tenderness.  Neurological:  Negative for dizziness and headaches.  Hematological:  Negative for adenopathy.  Psychiatric/Behavioral:  Negative for behavioral problems and hallucinations.     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, civil service  Tobacco Use   Smoking status: Former    Current packs/day: 0.00    Average packs/day: 0.3 packs/day for 4.0 years (1.0 ttl pk-yrs)    Types: Cigarettes    Start date: 08/26/1962    Quit date: 08/26/1966    Years since quitting: 58.0   Smokeless tobacco: Never  Substance and Sexual Activity   Alcohol use: Yes    Alcohol/week: 2.0 - 3.0 standard drinks of alcohol    Types: 2 - 3 Glasses of wine per week    Comment: wine socially   Drug use: No    Comment: marijuana -- very little yrs ago   Sexual activity: Yes    Partners: Male    Birth control/protection: Surgical, Post-menopausal    Comment: tubal  Other Topics Concern   Not on file  Social History Narrative   Household-- pt and husband (Mr Hartshorne)   Social Drivers of Health   Tobacco Use: Low Risk  (08/19/2024)   Received from [facility]   Patient  History    Smoking Tobacco Use: Never    Smokeless Tobacco Use: Never    Passive Exposure: Not on file  Financial Resource Strain: Low Risk (08/25/2024)   Overall Financial Resource Strain (CARDIA)    Difficulty of Paying Living Expenses: Not hard at all  Food Insecurity: No Food Insecurity (08/25/2024)   Epic    Worried About Programme Researcher, Broadcasting/film/video in the Last Year: Never true    Ran Out of Food in the Last Year: Never true  Transportation Needs: No Transportation Needs (08/25/2024)   Epic    Lack of Transportation (Medical): No    Lack of Transportation (Non-Medical): No  Physical Activity: Sufficiently Active (08/25/2024)   Exercise Vital Sign    Days of  Exercise per Week: 5 days    Minutes of Exercise per Session: 50 min  Stress: Stress Concern Present (08/25/2024)   [employer] of Occupational Health - Occupational Stress Questionnaire    Feeling of Stress: To some extent  Social Connections: Moderately Isolated (08/25/2024)   Social Connection and Isolation Panel    Frequency of Communication with Friends and Family: More than three times a week    Frequency of Social Gatherings with Friends and Family: More than three times a week    Attends Religious Services: Never    Database Administrator or Organizations: No    Attends Engineer, Structural: Not on file    Marital Status: Married  Catering Manager Violence: Not At Risk (05/10/2024)   Epic    Fear of Current or Ex-Partner: No    Emotionally Abused: No    Physically Abused: No    Sexually Abused: No  Depression (PHQ2-9): Low Risk (05/10/2024)   Depression (PHQ2-9)    PHQ-2 Score: 3  Alcohol Screen: Low Risk (08/25/2024)   Alcohol Screen    Last Alcohol Screening Score (AUDIT): 3  Housing: Low Risk (08/25/2024)   Epic    Unable to Pay for Housing in the Last Year: No    Number of Times Moved in the Last Year: 1    Homeless in the Last Year: No  Utilities: Not At Risk (05/10/2024)   Epic    Threatened with loss of utilities: No  Health Literacy: Adequate Health Literacy (05/10/2024)   B1300 Health Literacy    Frequency of need for help with medical instructions: Never    Past Surgical History:  Procedure Laterality Date   CERVICAL CONE BIOPSY  1978   benign   CORONARY STENT PLACEMENT  11/2003   LAD   TUBAL LIGATION Bilateral 1980    Family History  Problem Relation Age of Onset   Lymphoma Mother        Lymphoma   Cancer Mother    Early death Mother    Heart attack Father 53       second at age 54   Early death Father    Heart disease Father    Heart disease Brother    Colon cancer Brother 13   Atrial fibrillation Brother    Heart attack  Paternal Grandmother    Stroke Paternal Grandmother    Heart attack Paternal Grandfather    Stroke Paternal Grandfather    Breast cancer Neg Hx    Diabetes Neg Hx     Allergies[1]  Medications Ordered Prior to Encounter[2]  BP 128/80   Pulse 79   Temp 98.1 F (36.7 C) (Oral)   Resp 15   Ht 5' 3 (1.6 m)  Wt 144 lb (65.3 kg)   SpO2 98%   BMI 25.51 kg/m        Objective:   Physical Exam  General Mental Status- Alert. General Appearance- Not in acute distress.   Skin -left side scalp. Small area about 2 mm wide that is faint pink and mild tender. Folliculitis like.  Neck-  No JVD.  Chest and Lung Exam Auscultation: Breath Sounds:-CTA  Cardiovascular Auscultation:Rythm- RRR Murmurs & Other Heart Sounds:Auscultation of the heart reveals- No Murmurs.  Abdomen Inspection:-Inspeection Normal. Palpation/Percussion:Note:No mass. Palpation and Percussion of the abdomen reveal- Non Tender, Non Distended + BS, no rebound or guarding.    Neurologic Cranial Nerve exam:- CN III-XII intact(No nystagmus), symmetric smile. Strength:- 5/5 equal and symmetric strength both upper and lower extremities.   Back- no cva tenderness.   Heent- left ear canal wax blocking view of left tm. Only small portion of tm seen which appears pink though other 90% of tim is not see due to wax blocking view. Rt tm is normal. No sinus pressure.     Assessment & Plan:   Lower abdominal pain with loose stools Low-level abdominal pain with bloating and loose stools, likely due to recent Augmentin use. Differential includes antibiotic-associated diarrhea and potential diverticulitis. Labs pending for infection, kidney function, and liver enzymes. - Monitor symptoms and update on Friday regarding changes or worsening. - Consider CT imaging if symptoms worsen or suspicion of diverticulitis arises. - Review lab results for infection markers, kidney function, and liver enzymes. -turn in stool  studies if loose stools become watery/diarrhea like. presently more semisolid.  Impacted cerumen, left ear Impacted cerumen obstructing view of eardrum. Previous ear infection treated with Augmentin. Wax may have shifted position. - Start Debrox (wax softener) on Monday. - Schedule follow-up for wax removal on Thursday. Can lavage ear if not having pain. - Consider ENT referral if symptoms persist or worsen.  Folliculitis Tender scalp bump, likely folliculitis. - Apply topical mupirocin twice daily. - Monitor for worsening symptoms and update on Friday. - Consider oral antibiotics if topical treatment is insufficient and symptoms worsen.   Follow up date to be determined after lab review and update on friday morning. Try to send update by 9 am.  Ivo Moga, PA-C     [1] No Known Allergies [2]  Current Outpatient Medications on File Prior to Visit  Medication Sig Dispense Refill   alendronate  (FOSAMAX ) 70 MG tablet Take 1 tablet (70 mg total) by mouth once a week. Take with full glass of water on empty stomach 12 tablet 3   aspirin EC 81 MG tablet Take 81 mg by mouth daily. Swallow whole.     Cholecalciferol (VITAMIN D -3 PO) Take 500 Units by mouth daily.      losartan  (COZAAR ) 50 MG tablet Take 1 tablet (50 mg total) by mouth daily. 90 tablet 1   Multiple Vitamin (MULTI VITAMIN) TABS Take 1 tablet by mouth daily.     rosuvastatin  (CRESTOR ) 40 MG tablet Take 1 tablet (40 mg total) by mouth at bedtime. 90 tablet 1   No current facility-administered medications on file prior to visit.   "

## 2024-08-25 NOTE — Telephone Encounter (Signed)
 FYI Only or Action Required?: FYI only for provider: appointment scheduled on 08/25/24.  Patient was last seen in primary care on 03/02/2024 by Kara Aloysius BRAVO, MD.  Called Nurse Triage reporting Diarrhea.  Symptoms began varies.  Interventions attempted: OTC medications: acetaminophen, advil duo-action (not while taking acetaminophen).   Symptoms are: unchanged.  Triage Disposition: See HCP Within 4 Hours (Or PCP Triage)  Patient/caregiver understands and will follow disposition?: Yes Reason for Disposition  Abdomen looks much more swollen than usual  Answer Assessment - Initial Assessment Questions Patient began amoxicillin 08/19/24 for an ear infection, states pain resolved but cannot hear. Also reports loose, sand-colored stool x 1 week. States initially was black, but that resolved when she discontinued pepto bismol. Also reports atraumatic head bump that is tender to the touch x 2 days.  1. ANTIBIOTIC: What antibiotic are you taking? How many times per day?     Completed amoxicillin 2. ANTIBIOTIC ONSET: When was the antibiotic started?     Yes, completed 3. DIARRHEA SEVERITY: How bad is the diarrhea? How many more stools have you had in the past 24 hours than normal?      States is loose and sand-colored 4. ONSET: When did the diarrhea begin?      1 week 5. BM CONSISTENCY: How loose or watery is the diarrhea?      Soft 6. VOMITING: Are you also vomiting? If Yes, ask: How many times in the past 24 hours?      Denies 7. ABDOMEN PAIN: Are you having any abdomen pain? If Yes, ask: What does it feel like? (e.g., crampy, dull, intermittent, constant)      States dull pain  Protocols used: Diarrhea on Antibiotics-A-AH Copied from CRM #[id number]. Topic: Clinical - Red Word Triage >> Aug 25, 2024  8:19 AM [name] wrote: Red Word that prompted transfer to Nurse Triage:  Christmas day had terrible ear ache, went to UC, received antibiotic for ear infection for left  ear, seems to not be getting better. having issue with bowel movement, between loose and sand, stomach pain, bump on head pop up few days ago, it is tender, it hurts to touch.

## 2024-08-25 NOTE — Patient Instructions (Addendum)
 Lower abdominal pain with loose stools Low-level abdominal pain with bloating and loose stools, likely due to recent Augmentin use. Differential includes antibiotic-associated diarrhea and potential diverticulitis. Labs pending for infection, kidney function, and liver enzymes. - Monitor symptoms and update on Friday regarding changes or worsening. - Consider CT imaging if symptoms worsen or suspicion of diverticulitis arises. - Review lab results for infection markers, kidney function, and liver enzymes. -turn in stool studies if loose stools become watery/diarrhea like. presently more semisolid.  Impacted cerumen, left ear Impacted cerumen obstructing view of eardrum. Previous ear infection treated with Augmentin. Wax may have shifted position. - Start Debrox (wax softener) on Monday. - Schedule follow-up for wax removal on Thursday. Can lavage ear if not having pain. - Consider ENT referral if symptoms persist or worsen.  Folliculitis Tender scalp bump, likely folliculitis. - Apply topical mupirocin twice daily. - Monitor for worsening symptoms and update on Friday. - Consider oral antibiotics if topical treatment is insufficient and symptoms worsen.   Follow up date to be determined after lab review and update on friday morning. Try to send update by 9 am.

## 2024-08-26 ENCOUNTER — Ambulatory Visit: Payer: Self-pay | Admitting: Medical

## 2024-08-27 ENCOUNTER — Other Ambulatory Visit

## 2024-08-27 ENCOUNTER — Telehealth: Payer: Self-pay | Admitting: Internal Medicine

## 2024-08-27 NOTE — Telephone Encounter (Signed)
 Called pt appointment scheduled for 09/02/2024 @ 9:20

## 2024-08-27 NOTE — Telephone Encounter (Signed)
 Copied from CRM [id number]. Topic: Appointments - Scheduling Inquiry for Clinic >> Aug 27, 2024 10:03 AM Tinnie BROCKS wrote: Reason for CRM: Pt calling to make ear irrigation appointment for next Thursday the 8th. Per KMS, crm needs to be sent to schedule this appointment type. Please return patients call at [phone]

## 2024-08-27 NOTE — Telephone Encounter (Signed)
 Patient is scheduled for 09/02/24

## 2024-08-28 LAB — CLOSTRIDIUM DIFFICILE BY PCR: Toxigenic C. Difficile by PCR: NEGATIVE

## 2024-08-30 ENCOUNTER — Other Ambulatory Visit: Payer: Self-pay | Admitting: Internal Medicine

## 2024-08-30 LAB — OVA AND PARASITE EXAMINATION
CONCENTRATE RESULT:: NONE SEEN
MICRO NUMBER:: 17419443
SPECIMEN QUALITY:: ADEQUATE
TRICHROME RESULT:: NONE SEEN

## 2024-08-31 LAB — STOOL CULTURE: E coli, Shiga toxin Assay: NEGATIVE

## 2024-09-02 ENCOUNTER — Ambulatory Visit (INDEPENDENT_AMBULATORY_CARE_PROVIDER_SITE_OTHER): Admitting: Medical

## 2024-09-02 VITALS — BP 120/78 | HR 77 | Temp 97.8°F | Resp 16 | Ht 63.0 in | Wt 144.0 lb

## 2024-09-02 DIAGNOSIS — H6122 Impacted cerumen, left ear: Secondary | ICD-10-CM

## 2024-09-02 NOTE — Patient Instructions (Signed)
 Impacted cerumen of left ear wax removed completely. Tympanic membrane clear, no infection post wax removal. Debrox equivalent eardrops softened wax.  -advise if in future feels blocked again use debrox and follow up after 4 day use.  -if pain in ear be seen in office.  Follow in with pcp as regularly scheduled.

## 2024-09-02 NOTE — Progress Notes (Signed)
" ° °  Subjective:    Patient ID: Kara Wheeler, female    DOB: 02/14/1946, 79 y.o.   MRN: [id number]  HPI   Kara Wheeler is a 79 year old female who presents for follow-up of left ear wax impaction and recent ear infection.  Her left ear pain has resolved after antibiotic she took. Then started four days of over-the-counter Debrox-equivalent drops. At the last visit she had cerumen impaction but did not wash out wax as she was still at end of antibiotic course and it was unclear whether infection resolved.. She feels the drops have softened the wax.   Review of Systems See hpi    Objective:   Physical Exam  General no acute distress  Left ear exam Ear lavage, left ear. Pt gave verbal consent after discussing risk vs benefit. Large amount of cerumen removed from left ear canal. Residual cerumen present in middle portion canal after first lavage. Superior and inferior portions of tympanic membrane visible and normal after first lavage. No evidence of infection. Second lavage attempt performed with  complete partial removal of cerumen. No pain post removal. tm intact and not infected. Hearing normal.      Assessment & Plan:   Impacted cerumen of left ear wax removed completely. Tympanic membrane clear, no infection post wax removal. Debrox equivalent eardrops softened wax.  -advise if in future feels blocked again use debrox and follow up after 4 day use.  -if pain in ear be seen in office.  Follow in with pcp as regularly scheduled.  Dallas Maxwell, PA-C  "

## 2024-09-06 ENCOUNTER — Encounter: Payer: Self-pay | Admitting: Internal Medicine

## 2024-09-06 ENCOUNTER — Ambulatory Visit: Admitting: Internal Medicine

## 2024-09-06 VITALS — BP 124/80 | HR 62 | Temp 98.1°F | Resp 16 | Ht 63.0 in | Wt 144.4 lb

## 2024-09-06 DIAGNOSIS — R1030 Lower abdominal pain, unspecified: Secondary | ICD-10-CM | POA: Diagnosis not present

## 2024-09-06 DIAGNOSIS — H9192 Unspecified hearing loss, left ear: Secondary | ICD-10-CM | POA: Diagnosis not present

## 2024-09-06 NOTE — Patient Instructions (Addendum)
 Please read your instructions carefully.   Go to the front desk for the checkout Please make an appointment  for a physical in ~ 3 to 4  months   Will refer you to the ENT doctor

## 2024-09-06 NOTE — Progress Notes (Unsigned)
 "  Subjective:    Patient ID: Kara Wheeler, female    DOB: May 31, 1946, 79 y.o.   MRN: [id number]  DOS:  09/06/2024 Follow-up  Was diagnosed with a left ear infection at the urgent care, treated with antibiotics, pain resolved. Was seen here 09/02/2024, had a ear lavage. The pain has not returned, she has not seen any discharge or bleeding however she cannot hear out of the left ear. Denies any major problems with nasal congestion and sinus pain.  Also, was seen with abdominal pain, that is largely resolved.  Review of Systems See above   Past Medical History:  Diagnosis Date   Ankle fracture, right 2013   Coronary atherosclerosis of native coronary artery 2005   Taxus drug-eluting stent to treat severe stenosis in the proximal LAD   HTN (hypertension)    Mixed dyslipidemia    Osteoarthritis     Past Surgical History:  Procedure Laterality Date   CERVICAL CONE BIOPSY  1978   benign   CORONARY STENT PLACEMENT  11/2003   LAD   TUBAL LIGATION Bilateral 1980    Current Outpatient Medications  Medication Instructions   alendronate  (FOSAMAX ) 70 mg, Oral, Weekly, Take with full glass of water on empty stomach   aspirin EC 81 mg, Daily   Cholecalciferol (VITAMIN D -3 PO) 500 Units, Daily   losartan  (COZAAR ) 50 mg, Oral, Daily   Multiple Vitamin (MULTI VITAMIN) TABS 1 tablet, Daily   rosuvastatin  (CRESTOR ) 40 mg, Oral, Daily at bedtime       Objective:   Physical Exam BP 124/80   Pulse 62   Temp 98.1 F (36.7 C) (Oral)   Resp 16   Ht 5' 3 (1.6 m)   Wt 144 lb 6 oz (65.5 kg)   SpO2 97%   BMI 25.57 kg/m  General:   Well developed, NAD, BMI noted.  HEENT:  Normocephalic . Face symmetric, atraumatic Right ear: Normal Left ear: TM slightly retracted but not red.  Canal normal, no wax, no discharge, no blood. Abdomen:  Not distended, soft, non-tender. No rebound or rigidity.   Skin: Not pale. Not jaundice Lower extremities: no pretibial edema bilaterally   Neurologic:  alert & oriented X3.  Speech normal, gait appropriate for age and unassisted Psych--  Cognition and judgment appear intact.  Cooperative with normal attention span and concentration.  Behavior appropriate. No anxious or depressed appearing.     Assessment   Assessment Hyper glycemia A1c 6.0 (02-2017) HTN Dyslipidemia Elevated LFTs: Mild, chronic -hep B&C (-)  - 04-2022:Electrophoresis, AMA, ferritin negative.  ANA mildly positive CV: Dr Wonda  CAD dx 2005, was asx >> stent  H/o false  Positive stress-EKG Myoview  2013 negative.  stress echo normal 11-2014 Normal echo stress test 08-2017 Osteopenia: Started Fosamax  08/2023. H/o abnormal PAP, cone bx (remotely) H/o Fx distal fibula -R- 2013, no surgery  PLAN Decreased hearing: Left ear with decreased hearing after she was treated for a ear infection and cerumen impaction.  Exam today is benign other than the TM being slightly retracted. No major problems with sinus congestion.  Patient is concerned about the issue, referred to ENT Low abdominal pain: Seen 08/25/2024, had abdominal pain, loose stools, likely due to antibiotics.  Labs WNL, stool studies negative.  Now asymptomatic Preventive care: Had a flu shot, will check with her pharmacy to confirm she had a COVID-vaccine. RTC CPX 3 to 4 months  7/8 Hyperglycemia: Check A1c HTN: BP very good, on losartan , check BMP. Left  lumbalgia:  earlier this year she complained of L hip pain, at this point the pain has migrated to the left back.  She is slightly TTP at the sacroiliac area. Last seen by Ortho by some similar pain years ago. Plan: X-ray, sports medicine referral, sacroiliac dysfunction? Osteoporosis: Tolerating Fosamax  well Varicose veins: See LOV, US  negative for DVT, symptoms resolved. RTC 6 months.   "

## 2024-09-07 NOTE — Assessment & Plan Note (Signed)
 Decreased hearing: Left ear with decreased hearing after she was treated for a ear infection and cerumen impaction.  Exam today is benign other than the TM being slightly retracted. No major problems with sinus congestion.  Patient is concerned about the issue, referred to ENT Low abdominal pain: Seen 08/25/2024, had abdominal pain, loose stools, likely due to antibiotics.  Labs WNL, stool studies negative.  Now asymptomatic Preventive care: Had a flu shot, will check with her pharmacy to confirm she had a COVID-vaccine. RTC CPX 3 to 4 months

## 2024-09-17 ENCOUNTER — Encounter (INDEPENDENT_AMBULATORY_CARE_PROVIDER_SITE_OTHER): Payer: Self-pay

## 2024-09-17 ENCOUNTER — Telehealth (INDEPENDENT_AMBULATORY_CARE_PROVIDER_SITE_OTHER): Payer: Self-pay | Admitting: Physician Assistant

## 2024-09-17 NOTE — Telephone Encounter (Signed)
 Left message and sent MyChart for patient to call to reschedule 09/20/2024 appointment.  Office closed due to adverse weather

## 2024-09-20 ENCOUNTER — Institutional Professional Consult (permissible substitution) (INDEPENDENT_AMBULATORY_CARE_PROVIDER_SITE_OTHER): Admitting: Physician Assistant

## 2024-09-30 ENCOUNTER — Institutional Professional Consult (permissible substitution) (INDEPENDENT_AMBULATORY_CARE_PROVIDER_SITE_OTHER): Admitting: Physician Assistant

## 2024-10-21 ENCOUNTER — Institutional Professional Consult (permissible substitution) (INDEPENDENT_AMBULATORY_CARE_PROVIDER_SITE_OTHER): Admitting: Physician Assistant

## 2025-01-05 ENCOUNTER — Encounter: Admitting: Internal Medicine

## 2025-05-12 ENCOUNTER — Ambulatory Visit
